# Patient Record
Sex: Female | Born: 1995 | Race: White | Hispanic: No | Marital: Single | Smoking: Current every day smoker
Health system: Southern US, Community
[De-identification: ages and names within clinical notes are randomized; demographics above are authoritative.]

## PROBLEM LIST (undated history)

## (undated) ENCOUNTER — Inpatient Hospital Stay (HOSPITAL_COMMUNITY): Payer: Self-pay

## (undated) DIAGNOSIS — F419 Anxiety disorder, unspecified: Secondary | ICD-10-CM

## (undated) DIAGNOSIS — R87629 Unspecified abnormal cytological findings in specimens from vagina: Secondary | ICD-10-CM

## (undated) DIAGNOSIS — R569 Unspecified convulsions: Secondary | ICD-10-CM

## (undated) DIAGNOSIS — I10 Essential (primary) hypertension: Secondary | ICD-10-CM

## (undated) DIAGNOSIS — A549 Gonococcal infection, unspecified: Secondary | ICD-10-CM

## (undated) DIAGNOSIS — G40909 Epilepsy, unspecified, not intractable, without status epilepticus: Secondary | ICD-10-CM

## (undated) DIAGNOSIS — F319 Bipolar disorder, unspecified: Secondary | ICD-10-CM

## (undated) DIAGNOSIS — F53 Postpartum depression: Secondary | ICD-10-CM

## (undated) DIAGNOSIS — A749 Chlamydial infection, unspecified: Secondary | ICD-10-CM

## (undated) HISTORY — DX: Essential (primary) hypertension: I10

## (undated) HISTORY — PX: NO PAST SURGERIES: SHX2092

---
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
## 2015-09-09 ENCOUNTER — Emergency Department (HOSPITAL_COMMUNITY)
Admission: EM | Admit: 2015-09-09 | Discharge: 2015-09-09 | Disposition: A | Discharge status: Home / Self Care | Payer: Self-pay | Attending: Emergency Medicine | Admitting: Emergency Medicine

## 2015-09-09 ENCOUNTER — Encounter (HOSPITAL_COMMUNITY): Payer: Self-pay | Admitting: Emergency Medicine

## 2015-09-09 ENCOUNTER — Emergency Department (HOSPITAL_COMMUNITY): Payer: Self-pay

## 2015-09-09 ENCOUNTER — Inpatient Hospital Stay (HOSPITAL_COMMUNITY)
Admission: EM | Admit: 2015-09-09 | Discharge: 2015-09-11 | DRG: 101 | Disposition: A | Discharge status: Home / Self Care | Payer: Self-pay | Attending: Internal Medicine | Admitting: Internal Medicine

## 2015-09-09 ENCOUNTER — Encounter (HOSPITAL_COMMUNITY): Payer: Self-pay | Admitting: Radiology

## 2015-09-09 DIAGNOSIS — F1290 Cannabis use, unspecified, uncomplicated: Secondary | ICD-10-CM | POA: Diagnosis present

## 2015-09-09 DIAGNOSIS — Z91048 Other nonmedicinal substance allergy status: Secondary | ICD-10-CM

## 2015-09-09 DIAGNOSIS — R451 Restlessness and agitation: Secondary | ICD-10-CM | POA: Diagnosis not present

## 2015-09-09 DIAGNOSIS — F17200 Nicotine dependence, unspecified, uncomplicated: Secondary | ICD-10-CM | POA: Diagnosis present

## 2015-09-09 DIAGNOSIS — G40909 Epilepsy, unspecified, not intractable, without status epilepticus: Secondary | ICD-10-CM

## 2015-09-09 DIAGNOSIS — Z9114 Patient's other noncompliance with medication regimen: Secondary | ICD-10-CM

## 2015-09-09 DIAGNOSIS — F319 Bipolar disorder, unspecified: Secondary | ICD-10-CM | POA: Diagnosis present

## 2015-09-09 DIAGNOSIS — F419 Anxiety disorder, unspecified: Secondary | ICD-10-CM | POA: Diagnosis present

## 2015-09-09 DIAGNOSIS — Z79899 Other long term (current) drug therapy: Secondary | ICD-10-CM | POA: Insufficient documentation

## 2015-09-09 DIAGNOSIS — G40409 Other generalized epilepsy and epileptic syndromes, not intractable, without status epilepticus: Principal | ICD-10-CM | POA: Diagnosis present

## 2015-09-09 DIAGNOSIS — R112 Nausea with vomiting, unspecified: Secondary | ICD-10-CM

## 2015-09-09 DIAGNOSIS — E876 Hypokalemia: Secondary | ICD-10-CM | POA: Diagnosis present

## 2015-09-09 DIAGNOSIS — R569 Unspecified convulsions: Secondary | ICD-10-CM

## 2015-09-09 HISTORY — DX: Unspecified convulsions: R56.9

## 2015-09-09 HISTORY — DX: Bipolar disorder, unspecified: F31.9

## 2015-09-09 LAB — COMPREHENSIVE METABOLIC PANEL
ALBUMIN: 4.5 g/dL (ref 3.5–5.0)
ALT: 13 U/L — ABNORMAL LOW (ref 14–54)
ANION GAP: 6 (ref 5–15)
AST: 20 U/L (ref 15–41)
Alkaline Phosphatase: 61 U/L (ref 38–126)
BILIRUBIN TOTAL: 1 mg/dL (ref 0.3–1.2)
BUN: 13 mg/dL (ref 6–20)
CO2: 25 mmol/L (ref 22–32)
Calcium: 9.3 mg/dL (ref 8.9–10.3)
Chloride: 106 mmol/L (ref 101–111)
Creatinine, Ser: 0.86 mg/dL (ref 0.44–1.00)
GFR calc Af Amer: >60 mL/min (ref >60)
GLUCOSE: 87 mg/dL (ref 65–99)
POTASSIUM: 3.9 mmol/L (ref 3.5–5.1)
Sodium: 137 mmol/L (ref 135–145)
TOTAL PROTEIN: 8.2 g/dL — AB (ref 6.5–8.1)

## 2015-09-09 LAB — I-STAT BETA HCG BLOOD, ED (MC, WL, AP ONLY): I-stat hCG, quantitative: <5 m[IU]/mL (ref <5)

## 2015-09-09 LAB — I-STAT CHEM 8, ED
BUN: 8 mg/dL (ref 6–20)
CALCIUM ION: 1.19 mmol/L (ref 1.13–1.30)
CREATININE: 0.8 mg/dL (ref 0.44–1.00)
Chloride: 102 mmol/L (ref 101–111)
GLUCOSE: 81 mg/dL (ref 65–99)
HCT: 40 % (ref 36.0–46.0)
HEMOGLOBIN: 13.6 g/dL (ref 12.0–15.0)
Potassium: 3.4 mmol/L — ABNORMAL LOW (ref 3.5–5.1)
Sodium: 140 mmol/L (ref 135–145)
TCO2: 24 mmol/L (ref 0–100)

## 2015-09-09 LAB — CBC WITH DIFFERENTIAL/PLATELET
BASOS PCT: 1 %
Basophils Absolute: 0.1 10*3/uL (ref 0.0–0.1)
EOS PCT: 2 %
Eosinophils Absolute: 0.2 10*3/uL (ref 0.0–0.7)
HEMATOCRIT: 39.9 % (ref 36.0–46.0)
Hemoglobin: 12.9 g/dL (ref 12.0–15.0)
Lymphocytes Relative: 32 %
Lymphs Abs: 2.5 10*3/uL (ref 0.7–4.0)
MCH: 27.7 pg (ref 26.0–34.0)
MCHC: 32.3 g/dL (ref 30.0–36.0)
MCV: 85.8 fL (ref 78.0–100.0)
MONO ABS: 1 10*3/uL (ref 0.1–1.0)
MONOS PCT: 13 %
NEUTROS ABS: 4.1 10*3/uL (ref 1.7–7.7)
Neutrophils Relative %: 52 %
Platelets: 282 10*3/uL (ref 150–400)
RBC: 4.65 MIL/uL (ref 3.87–5.11)
RDW: 14.7 % (ref 11.5–15.5)
WBC: 7.8 10*3/uL (ref 4.0–10.5)

## 2015-09-09 LAB — CBG MONITORING, ED: Glucose-Capillary: 93 mg/dL (ref 65–99)

## 2015-09-09 LAB — ETHANOL: Alcohol, Ethyl (B): <5 mg/dL (ref <5)

## 2015-09-09 LAB — RAPID URINE DRUG SCREEN, HOSP PERFORMED
AMPHETAMINES: NOT DETECTED
BARBITURATES: NOT DETECTED
BENZODIAZEPINES: NOT DETECTED
COCAINE: NOT DETECTED
Opiates: NOT DETECTED
TETRAHYDROCANNABINOL: POSITIVE — AB

## 2015-09-09 MED ORDER — LORAZEPAM 2 MG/ML IJ SOLN
1.0000 mg | Freq: Once | INTRAMUSCULAR | Status: AC
  Administered 2015-09-09: 1 mg via INTRAVENOUS
  Filled 2015-09-09: qty 1

## 2015-09-09 MED ORDER — LEVETIRACETAM 750 MG PO TABS
750.0000 mg | ORAL_TABLET | Freq: Two times a day (BID) | ORAL | 0 refills | Status: DC
Start: 2015-09-09 — End: 2015-09-11

## 2015-09-09 MED ORDER — LORAZEPAM 2 MG/ML IJ SOLN
INTRAMUSCULAR | Status: AC
  Administered 2015-09-09: 1 mg
  Filled 2015-09-09: qty 1

## 2015-09-09 MED ORDER — LACOSAMIDE 200 MG/20ML IV SOLN
200.0000 mg | Freq: Two times a day (BID) | INTRAVENOUS | Status: DC
  Administered 2015-09-09: 200 mg via INTRAVENOUS
  Filled 2015-09-09 (×2): qty 20

## 2015-09-09 MED ORDER — OXYCODONE-ACETAMINOPHEN 5-325 MG PO TABS
1.0000 | ORAL_TABLET | Freq: Once | ORAL | Status: AC
  Administered 2015-09-09: 1 via ORAL
  Filled 2015-09-09: qty 1

## 2015-09-09 MED ORDER — SODIUM CHLORIDE 0.9 % IV SOLN
1000.0000 mg | Freq: Once | INTRAVENOUS | Status: AC
  Administered 2015-09-09: 1000 mg via INTRAVENOUS
  Filled 2015-09-09: qty 10

## 2015-09-09 MED ORDER — ONDANSETRON HCL 4 MG/2ML IJ SOLN
4.0000 mg | Freq: Once | INTRAMUSCULAR | Status: AC
Start: 2015-09-10 — End: 2015-09-09
  Administered 2015-09-09: 4 mg via INTRAVENOUS
  Filled 2015-09-09: qty 2

## 2015-09-09 NOTE — ED Provider Notes (Signed)
[facility]-EMERGENCY DEPT Provider Note   CSN: [id number] Arrival date & time: 09/09/15  1521  First Provider Contact:  None       History   Chief Complaint Chief Complaint  Patient presents with  . Seizures    HPI Renee Velez is a 20 y.o. female.  20 year old female presents after having a witnessed seizure at home. Seizure lasted less than 5 minutes and was characterized as tonic-clonic activity with a slight postictal period. No tongue injury. No loss of bladder function. Patient does have a history of epilepsy and has been noncompliant with her Keppra for the past 1 month. Denies any recent use of alcohol or illicit drugs. Has a mild headache at this time but otherwise feels back to her baseline. Denies any focal neurological deficits. Patient states that stress is usually the cause of her seizures and she has been under great deal that recently   The history is provided by the patient.  Seizures      History reviewed. No pertinent past medical history.  There are no active problems to display for this patient.   History reviewed. No pertinent surgical history.  OB History    No data available       Home Medications    Prior to Admission medications   Not on File    Family History History reviewed. No pertinent family history.  Social History Social History  Substance Use Topics  . Smoking status: Current Every Day Smoker  . Smokeless tobacco: Never Used  . Alcohol use No     Allergies   Review of patient's allergies indicates no known allergies.   Review of Systems Review of Systems  Neurological: Positive for seizures.  All other systems reviewed and are negative.    Physical Exam Updated Vital Signs Ht  (1.651 m)   Wt 49.9 kg   LMP 08/04/2015   BMI 18.30 kg/m   Physical Exam  Constitutional: She is oriented to person, place, and time. She appears well-developed and well-nourished.  Non-toxic appearance. No distress.  HENT:  Head:  Normocephalic and atraumatic.  Eyes: Conjunctivae, EOM and lids are normal. Pupils are equal, round, and reactive to light.  Neck: Normal range of motion. Neck supple. No tracheal deviation present. No thyroid mass present.  Cardiovascular: Normal rate, regular rhythm and normal heart sounds.  Exam reveals no gallop.   No murmur heard. Pulmonary/Chest: Effort normal and breath sounds normal. No stridor. No respiratory distress. She has no decreased breath sounds. She has no wheezes. She has no rhonchi. She has no rales.  Abdominal: Soft. Normal appearance and bowel sounds are normal. She exhibits no distension. There is no tenderness. There is no rebound and no CVA tenderness.  Musculoskeletal: Normal range of motion. She exhibits no edema or tenderness.  Neurological: She is alert and oriented to person, place, and time. She has normal strength. No cranial nerve deficit or sensory deficit. GCS eye subscore is 4. GCS verbal subscore is 5. GCS motor subscore is 6.  Skin: Skin is warm and dry. No abrasion and no rash noted.  Psychiatric: She has a normal mood and affect. Her speech is normal and behavior is normal.  Nursing note and vitals reviewed.    ED Treatments / Results  Labs (all labs ordered are listed, but only abnormal results are displayed) Labs Reviewed  I-STAT BETA HCG BLOOD, ED ([facility], [facility], [facility] ONLY)  I-STAT CHEM 8, ED    EKG  EKG Interpretation None  Radiology No results found.  Procedures Procedures (including critical care time)  Medications Ordered in ED Medications  levETIRAcetam (KEPPRA) 1,000 mg in sodium chloride 0.9 % 100 mL IVPB (not administered)     Initial Impression / Assessment and Plan / ED Course  I have reviewed the triage vital signs and the nursing notes.  Pertinent labs & imaging results that were available during my care of the patient were reviewed by me and considered in my medical decision making (see chart for details).  Clinical  Course    Patient loaded with Keppra here and no seizure activity noted. Begin prescription for same and referrals  Final Clinical Impressions(s) / ED Diagnoses   Final diagnoses:  None    New Prescriptions New Prescriptions   No medications on file     Lorre NickAnthony Francella Barnett, MD 09/09/15 [phone]

## 2015-09-09 NOTE — ED Triage Notes (Signed)
Patient brought in by private care. Patient having seizure in car. When she was removed from care and brought to room patient was postictal.

## 2015-09-09 NOTE — ED Provider Notes (Signed)
[facility]PT Provider Note   CSN: [id number] Arrival date & time: 09/09/15  2237  First Provider Contact:  None       History   Chief Complaint Chief Complaint  Patient presents with  . Seizures    HPI Renee Velez is a 20 y.o. female.  20 year old female who was seen by me earlier today after having a witnessed seizure at home. She was loaded with Keppra here and discharged to home. According to her boyfriend, she had an additional seizure characterized as generalized tonic-clonic activity and he drove the patient here. ed staff found the patient be postictal. Patient does not remember what happened prior to arrival. Patient's boyfriend thinks that she may have drank beer but denies any illicit drug use.    Past Medical History:  Diagnosis Date  . Seizure (HCC)     There are no active problems to display for this patient.   History reviewed. No pertinent surgical history.  OB History    No data available       Home Medications    Prior to Admission medications   Medication Sig Start Date End Date Taking? Authorizing Provider  busPIRone (BUSPAR) 10 MG tablet Take 10 mg by mouth 3 (three) times daily.   Yes Historical Provider, MD  folic acid (FOLVITE) 1 MG tablet Take 1 mg by mouth 4 (four) times daily.   Yes Historical Provider, MD  levETIRAcetam (KEPPRA) 750 MG tablet Take 750 mg by mouth 2 (two) times daily.   Yes Historical Provider, MD  levETIRAcetam (KEPPRA) 750 MG tablet Take 1 tablet (750 mg total) by mouth 2 (two) times daily. 09/09/15  Yes Lorre NickAnthony Lexany Belknap, MD    Family History History reviewed. No pertinent family history.  Social History Social History  Substance Use Topics  . Smoking status: Current Every Day Smoker  . Smokeless tobacco: Never Used  . Alcohol use No     Allergies   Nickel   Review of Systems Review of Systems  Unable to perform ROS: Acuity of condition     Physical Exam Updated Vital Signs BP 135/82 (BP Location:  Right Arm)   Pulse 89   Temp 98.1 F (36.7 C) (Oral)   Resp 18   LMP 08/04/2015   SpO2 100%   Physical Exam  Constitutional: She appears well-developed and well-nourished. She appears lethargic.  Non-toxic appearance. No distress.  HENT:  Head: Normocephalic and atraumatic.  Eyes: Conjunctivae, EOM and lids are normal. Pupils are equal, round, and reactive to light.  Neck: Normal range of motion. Neck supple. No tracheal deviation present. No thyroid mass present.  Cardiovascular: Normal rate, regular rhythm and normal heart sounds.  Exam reveals no gallop.   No murmur heard. Pulmonary/Chest: Effort normal and breath sounds normal. No stridor. No respiratory distress. She has no decreased breath sounds. She has no wheezes. She has no rhonchi. She has no rales.  Abdominal: Soft. Normal appearance and bowel sounds are normal. She exhibits no distension. There is no tenderness. There is no rebound and no CVA tenderness.  Musculoskeletal: Normal range of motion. She exhibits no edema or tenderness.  Neurological: She has normal strength. She appears lethargic. She is disoriented. No cranial nerve deficit or sensory deficit. GCS eye subscore is 4. GCS verbal subscore is 5. GCS motor subscore is 6.  Skin: Skin is warm and dry. No abrasion and no rash noted.  Psychiatric: She has a normal mood and affect. Her speech is normal and behavior is  normal.  Nursing note and vitals reviewed.    ED Treatments / Results  Labs (all labs ordered are listed, but only abnormal results are displayed) Labs Reviewed  URINE RAPID DRUG SCREEN, HOSP PERFORMED - Abnormal; Notable for the following:       Result Value   Tetrahydrocannabinol POSITIVE (*)    All other components within normal limits  I-STAT CHEM 8, ED - Abnormal; Notable for the following:    Potassium 3.4 (*)    All other components within normal limits  ETHANOL  CBG MONITORING, ED    EKG  EKG Interpretation None        Radiology No results found.  Procedures Procedures (including critical care time)  Medications Ordered in ED Medications  LORazepam (ATIVAN) injection 1 mg (1 mg Intravenous Given 09/09/15 2250)  LORazepam (ATIVAN) injection 1 mg (1 mg Intravenous Given 09/09/15 2307)     Initial Impression / Assessment and Plan / ED Course  I have reviewed the triage vital signs and the nursing notes.  Pertinent labs & imaging results that were available during my care of the patient were reviewed by me and considered in my medical decision making (see chart for details).  Clinical Course    Patient given Ativan 1 mg IV push and then had an additional seizure while having her neuroimaging. Was given additional 1 mg of Ativan. Patient stressed screen positive for marijuana. Electrolytes without significant abnormality. Counseled to neurology was obtained by Dr. Roseanne Reno. Recommended that patient be started on Vimpat and transfer to stepdown at [facility]. [facility] consult triad hospitalist   CRITICAL CARE Performed by: Toy Baker Total critical care time: 50 minutes Critical care time was exclusive of separately billable procedures and treating other patients. Critical care was necessary to treat or prevent imminent or life-threatening deterioration. Critical care was time spent personally by me on the following activities: development of treatment plan with patient and/or surrogate as well as nursing, discussions with consultants, evaluation of patient's response to treatment, examination of patient, obtaining history from patient or surrogate, ordering and performing treatments and interventions, ordering and review of laboratory studies, ordering and review of radiographic studies, pulse oximetry and re-evaluation of patient's condition.   Final Clinical Impressions(s) / ED Diagnoses   Final diagnoses:  None    New Prescriptions New Prescriptions   No medications on file      Lorre Nick, MD 09/09/15 2323

## 2015-09-09 NOTE — ED Triage Notes (Signed)
Pt had witnessed seizure earlier today. Pt out of seizure medication. Complains of HA/light sensitivity. Alert and oriented.

## 2015-09-09 NOTE — ED Notes (Signed)
Pt returned from CT, states pt had 2 seizures during CT. Freida BusmanAllen made aware.

## 2015-09-10 ENCOUNTER — Encounter (HOSPITAL_COMMUNITY): Payer: Self-pay | Admitting: Internal Medicine

## 2015-09-10 ENCOUNTER — Observation Stay (HOSPITAL_COMMUNITY): Payer: Self-pay

## 2015-09-10 DIAGNOSIS — F17200 Nicotine dependence, unspecified, uncomplicated: Secondary | ICD-10-CM | POA: Diagnosis present

## 2015-09-10 DIAGNOSIS — R569 Unspecified convulsions: Secondary | ICD-10-CM

## 2015-09-10 DIAGNOSIS — E876 Hypokalemia: Secondary | ICD-10-CM | POA: Diagnosis present

## 2015-09-10 DIAGNOSIS — F1290 Cannabis use, unspecified, uncomplicated: Secondary | ICD-10-CM | POA: Diagnosis present

## 2015-09-10 DIAGNOSIS — F319 Bipolar disorder, unspecified: Secondary | ICD-10-CM | POA: Diagnosis present

## 2015-09-10 DIAGNOSIS — G40909 Epilepsy, unspecified, not intractable, without status epilepticus: Secondary | ICD-10-CM

## 2015-09-10 LAB — URINALYSIS, ROUTINE W REFLEX MICROSCOPIC
BILIRUBIN URINE: NEGATIVE
GLUCOSE, UA: NEGATIVE mg/dL
Hgb urine dipstick: NEGATIVE
KETONES UR: 40 mg/dL — AB
Leukocytes, UA: NEGATIVE
Nitrite: NEGATIVE
PH: 6 (ref 5.0–8.0)
Protein, ur: NEGATIVE mg/dL
SPECIFIC GRAVITY, URINE: 1.02 (ref 1.005–1.030)

## 2015-09-10 LAB — COMPREHENSIVE METABOLIC PANEL
ALBUMIN: 3.7 g/dL (ref 3.5–5.0)
ALK PHOS: 51 U/L (ref 38–126)
ALT: 12 U/L — ABNORMAL LOW (ref 14–54)
ANION GAP: 7 (ref 5–15)
AST: 16 U/L (ref 15–41)
BILIRUBIN TOTAL: 0.9 mg/dL (ref 0.3–1.2)
BUN: 6 mg/dL (ref 6–20)
CALCIUM: 9 mg/dL (ref 8.9–10.3)
CO2: 22 mmol/L (ref 22–32)
Chloride: 108 mmol/L (ref 101–111)
Creatinine, Ser: 0.74 mg/dL (ref 0.44–1.00)
GFR calc Af Amer: >60 mL/min (ref >60)
GLUCOSE: 85 mg/dL (ref 65–99)
POTASSIUM: 3.6 mmol/L (ref 3.5–5.1)
Sodium: 137 mmol/L (ref 135–145)
TOTAL PROTEIN: 6.6 g/dL (ref 6.5–8.1)

## 2015-09-10 LAB — CBG MONITORING, ED: GLUCOSE-CAPILLARY: 86 mg/dL (ref 65–99)

## 2015-09-10 LAB — CBC
HCT: 37.8 % (ref 36.0–46.0)
Hemoglobin: 12 g/dL (ref 12.0–15.0)
MCH: 27.3 pg (ref 26.0–34.0)
MCHC: 31.7 g/dL (ref 30.0–36.0)
MCV: 85.9 fL (ref 78.0–100.0)
PLATELETS: 267 10*3/uL (ref 150–400)
RBC: 4.4 MIL/uL (ref 3.87–5.11)
RDW: 14.2 % (ref 11.5–15.5)
WBC: 9.6 10*3/uL (ref 4.0–10.5)

## 2015-09-10 LAB — CREATININE, SERUM: Creatinine, Ser: 0.79 mg/dL (ref 0.44–1.00)

## 2015-09-10 LAB — LIPASE, BLOOD: LIPASE: 35 U/L (ref 11–51)

## 2015-09-10 LAB — MRSA PCR SCREENING: MRSA by PCR: NEGATIVE

## 2015-09-10 MED ORDER — NICOTINE 21 MG/24HR TD PT24
21.0000 mg | MEDICATED_PATCH | Freq: Every day | TRANSDERMAL | Status: DC | PRN
  Filled 2015-09-10: qty 1

## 2015-09-10 MED ORDER — SODIUM CHLORIDE 0.9 % IV SOLN
1000.0000 mg | Freq: Two times a day (BID) | INTRAVENOUS | Status: DC
  Administered 2015-09-10 – 2015-09-11 (×2): 1000 mg via INTRAVENOUS
  Filled 2015-09-10 (×3): qty 10

## 2015-09-10 MED ORDER — SODIUM CHLORIDE 0.9% FLUSH
3.0000 mL | Freq: Two times a day (BID) | INTRAVENOUS | Status: DC
  Administered 2015-09-10 (×2): 3 mL via INTRAVENOUS

## 2015-09-10 MED ORDER — IOPAMIDOL (ISOVUE-300) INJECTION 61%
INTRAVENOUS | Status: AC
  Administered 2015-09-10: 100 mL
  Filled 2015-09-10: qty 100

## 2015-09-10 MED ORDER — LORAZEPAM 2 MG/ML IJ SOLN
1.0000 mg | INTRAMUSCULAR | Status: DC | PRN
  Administered 2015-09-10 – 2015-09-11 (×4): 1 mg via INTRAVENOUS
  Filled 2015-09-10 (×4): qty 1

## 2015-09-10 MED ORDER — OXYCODONE HCL 5 MG PO TABS
5.0000 mg | ORAL_TABLET | Freq: Four times a day (QID) | ORAL | Status: DC | PRN
  Administered 2015-09-11: 5 mg via ORAL
  Filled 2015-09-10 (×2): qty 1

## 2015-09-10 MED ORDER — ONDANSETRON HCL 4 MG/2ML IJ SOLN
4.0000 mg | Freq: Four times a day (QID) | INTRAMUSCULAR | Status: DC | PRN
Start: 2015-09-10 — End: 2015-09-11
  Administered 2015-09-10 (×2): 4 mg via INTRAVENOUS
  Filled 2015-09-10 (×3): qty 2

## 2015-09-10 MED ORDER — KETOROLAC TROMETHAMINE 60 MG/2ML IM SOLN
60.0000 mg | Freq: Once | INTRAMUSCULAR | Status: DC
  Filled 2015-09-10: qty 2

## 2015-09-10 MED ORDER — SODIUM CHLORIDE 0.9 % IV SOLN
100.0000 mg | Freq: Two times a day (BID) | INTRAVENOUS | Status: DC
  Administered 2015-09-10 (×2): 100 mg via INTRAVENOUS
  Filled 2015-09-10 (×5): qty 10

## 2015-09-10 MED ORDER — KETOROLAC TROMETHAMINE 30 MG/ML IJ SOLN
30.0000 mg | Freq: Four times a day (QID) | INTRAMUSCULAR | Status: DC | PRN
  Administered 2015-09-11: 30 mg via INTRAVENOUS
  Filled 2015-09-10: qty 1

## 2015-09-10 MED ORDER — POTASSIUM CHLORIDE IN NACL 40-0.9 MEQ/L-% IV SOLN
INTRAVENOUS | Status: AC
  Administered 2015-09-10: 100 mL/h via INTRAVENOUS
  Filled 2015-09-10 (×2): qty 1000

## 2015-09-10 MED ORDER — SODIUM CHLORIDE 0.9 % IV SOLN
500.0000 mg | Freq: Once | INTRAVENOUS | Status: AC
  Administered 2015-09-10: 500 mg via INTRAVENOUS
  Filled 2015-09-10: qty 5

## 2015-09-10 MED ORDER — SODIUM CHLORIDE 0.9 % IV SOLN
750.0000 mg | Freq: Two times a day (BID) | INTRAVENOUS | Status: DC
  Administered 2015-09-10 (×2): 750 mg via INTRAVENOUS
  Filled 2015-09-10 (×3): qty 7.5

## 2015-09-10 MED ORDER — ENOXAPARIN SODIUM 40 MG/0.4ML ~~LOC~~ SOLN
40.0000 mg | Freq: Every day | SUBCUTANEOUS | Status: DC
  Administered 2015-09-10 – 2015-09-11 (×2): 40 mg via SUBCUTANEOUS
  Filled 2015-09-10 (×2): qty 0.4

## 2015-09-10 MED ORDER — NICOTINE 21 MG/24HR TD PT24
21.0000 mg | MEDICATED_PATCH | Freq: Every day | TRANSDERMAL | Status: DC
  Administered 2015-09-10 – 2015-09-11 (×2): 21 mg via TRANSDERMAL
  Filled 2015-09-10: qty 1

## 2015-09-10 MED ORDER — ONDANSETRON HCL 4 MG PO TABS: 4.0000 mg | ORAL_TABLET | Freq: Four times a day (QID) | ORAL | Status: DC | PRN

## 2015-09-10 NOTE — Progress Notes (Addendum)
Patient refused EEG . Just before EEG hookup was attempted pt began moving her head back and forth, eyes closed, not responding or following commands. RN paged, this activity lasted approximately 1 minute. Pt appeared drowsy but would readily follow commands and began talking. Almost immediatly after RN mentioned giving Keppra the patient  became very agitated and demanded to be discharged. RN and Water quality scientistunit director were in the room during this time. Just prior to this event pt stated that she "really needs a blunt"

## 2015-09-10 NOTE — Progress Notes (Signed)
Subjective:  Reviewed and was observed to have some brief seizure activity by the nursing team. Upon direct examination at this time rate is noted to have her boyfriend in the bed with her. After he was asked to exit the bed she was examined. She was able to follow commands in all 4 extremities. However, she seemed agitated and would not answer any questions. Attempts to examine her were met with significant resistance. This appeared somewhat atypical for a post ictal state.  Exam: Vitals:   09/10/15 0817 09/10/15 1146  BP: 125/62 122/67  Pulse: 61 (!) 54  Resp: 18 14  Temp: 98.2 F (36.8 C) 97.6 F (36.4 C)    HEENT-  Normocephalic, no lesions, without obvious abnormality.  Normal external eye and conjunctiva.  Normal TM's bilaterally.  Normal auditory canals and external ears. Normal external nose, mucus membranes and septum.  Normal pharynx. Cardiovascular- regular rate and rhythm, S1, S2 normal, no murmur, click, rub or gallop, pulses palpable throughout   Lungs- chest clear, no wheezing, rales, normal symmetric air entry, Heart exam - S1, S2 normal, no murmur, no gallop, rate regular Abdomen- soft, non-tender; bowel sounds normal; no masses,  no organomegaly Extremities- less then 2 second capillary refill Lymph-no adenopathy palpable Musculoskeletal-no joint tenderness, deformity or swelling Skin-warm and dry, no hyperpigmentation, vitiligo, or suspicious lesions    Gen: In bed, NAD MS: Jonee is able to follow commands. However, when asked questions she appears quite agitated and shakes her head back and forth and does not respond. Attempts to perform evaluation of the pupils is met with significant resistance. CN: There is no obvious facial asymmetry. Motor: Spontaneous movement of all 4 extremities Sensory: Unable to fully assess. DTR: Unable to assess due to resistance to exam.  Pertinent Labs/Diagnostics: Reviewed    Impression:   Najae was observed to have 4  episodes of shaking possibly representing seizure activity. We will provide a bolus of Keppra 500 mg. We will also increase the Keppra to 1000 mg twice a day. We will request continuous video EEG monitoring to further assess these spells. Her present behavior seems somewhat atypical for a post ictal state.  The nursing team mentioned that Valhalla had stated she did not want to be on Keppra. At the present time Verginia is not answering any questions about this. It is unknown what the issue with the Keppra is. It would seem reasonable to attempt to obtain seizure control with the Nelson given that that is already being used at this time. It will always be possible to switch to a new medication once the more urgent issue of seizure control has been obtained. She continues on Vimpat at the same time. However, Vimpat is an adjunctive medication and is not indicated for primary therapy.   Recommendations:  1. Bolus Keppra 500 mg IV. Increase Keppra 1000 mg twice a day.  2. Continuous video EEG monitoring.   James A. Tasia Catchings, M.D. Neurohospitalist Phone: [phone]   09/10/2015, 2:31 PM

## 2015-09-10 NOTE — Progress Notes (Addendum)
Patient began having generalized tonic clonic seizures, total of 4 seen all lasting less than a min each. PRN ativan given. Dr. Susie CassetteAbrol and Dr. Hilda BladesArmstrong notified. Will continue to monitor.

## 2015-09-10 NOTE — H&P (Signed)
History and Physical    Renee Velez RUE:[id number]:[id number] DOB: Nov 15, 1995 DOA: 09/09/2015  PCP: No PCP Per Patient   Patient coming from: Home.  Chief Complaint: Seizures.  HPI: Renee ObeyRaven Velez is a 20 y.o. female with medical history significant of bipolar disorder 1, seizure disorder, tobacco use disorder who was brought to the emergency department via private vehicle after the patient had a seizure at home.  Per patient's boyfriend, TJ, she seemed to have had an absence seizures earlier in the evening. Several minutes later, her boyfriend states that she started having tonic-clonic movements, but denies urinary or fecal incontinence, tongue biting or any other trauma. He states that he subsequently carried her to his vehicle and brought her to the emergency department. She has been out of her medications, including her Keppra, for the past month and a half.  ED Course: In the ER, the patient received lorazepam 1 mg 2 doses, ondansetron 4 mg IVP 1 and Vimpat 200 mg IVPB. She is currently sedated and unable to provide further information or history.  Review of Systems: As per HPI otherwise 10 point review of systems negative.   Past Medical History:  Diagnosis Date  . Bipolar 1 disorder (HCC)   . Seizure [facility](HCC)     History reviewed. No pertinent surgical history per patient's boyfriend. The patient is currently sedated and unable to provide further history.   reports that she has been smoking.  She has never used smokeless tobacco. She reports that she drinks alcohol. She reports that she does not use drugs.  Allergies  Allergen Reactions  . Nickel Rash    History unable to be reviewed due to patient's sedation.  No pertinent family history per patient's point.   Prior to Admission medications   Medication Sig Start Date End Date Taking? Authorizing Provider  busPIRone (BUSPAR) 10 MG tablet Take 10 mg by mouth 3 (three) times daily.   Yes Historical Provider, MD  folic acid  (FOLVITE) 1 MG tablet Take 1 mg by mouth 4 (four) times daily.   Yes Historical Provider, MD  levETIRAcetam (KEPPRA) 750 MG tablet Take 750 mg by mouth 2 (two) times daily.   Yes Historical Provider, MD  levETIRAcetam (KEPPRA) 750 MG tablet Take 1 tablet (750 mg total) by mouth 2 (two) times daily. 09/09/15  Yes Lorre NickAnthony Allen, MD    Physical Exam: Vitals:   09/09/15 2245 09/09/15 2246 09/09/15 2346  BP: 135/82  118/81  Pulse: 89  80  Resp: 18  15  Temp: 98.1 F (36.7 C)    TempSrc: Oral    SpO2: 100% 100% 98%      Constitutional: NAD, calm, comfortable, Sedated. Vitals:   09/09/15 2245 09/09/15 2246 09/09/15 2346  BP: 135/82  118/81  Pulse: 89  80  Resp: 18  15  Temp: 98.1 F (36.7 C)    TempSrc: Oral    SpO2: 100% 100% 98%   Eyes: PERRL, lids and conjunctivae normal ENMT: Mucous membranes are moist. Posterior pharynx clear of any exudate or lesions. Normal dentition.  Neck: normal, supple, no masses, no thyromegaly Respiratory: clear to auscultation bilaterally, no wheezing, no crackles. Normal respiratory effort. No accessory muscle use.  Cardiovascular: Regular rate and rhythm, no murmurs / rubs / gallops. No extremity edema. 2+ pedal pulses. No carotid bruits.  Abdomen: Bowel sounds positive. no tenderness, no masses palpated.  Musculoskeletal: no clubbing / cyanosis. No joint deformity upper and lower extremities. Good ROM, no contractures. Relaxed muscle tone due  to sedation.  Skin: Small ecchymosis area in LLE (left leg) Neurologic: Sedated and unable to fully evaluate.  Psychiatric: Sedated.    Labs on Admission: I have personally reviewed following labs and imaging studies  CBC:  Recent Labs Lab 09/09/15 1545 09/09/15 2252  WBC 7.8  --   NEUTROABS 4.1  --   HGB 12.9 13.6  HCT 39.9 40.0  MCV 85.8  --   PLT 282  --    Basic Metabolic Panel:  Recent Labs Lab 09/09/15 1545 09/09/15 2252  NA 137 140  K 3.9 3.4*  CL 106 102  CO2 25  --   GLUCOSE 87  81  BUN 13 8  CREATININE 0.86 0.80  CALCIUM 9.3  --    GFR: Estimated Creatinine Clearance: 88.4 mL/min (by C-G formula based on SCr of 0.8 mg/dL). Liver Function Tests:  Recent Labs Lab 09/09/15 1545  AST 20  ALT 13*  ALKPHOS 61  BILITOT 1.0  PROT 8.2*  ALBUMIN 4.5   CBG:  Recent Labs Lab 09/09/15 2242  GLUCAP 93     Radiological Exams on Admission: Ct Head Wo Contrast  Result Date: 09/09/2015 CLINICAL DATA:  Witnessed seizure today. History of seizures, out of seizure medication. EXAM: CT HEAD WITHOUT CONTRAST TECHNIQUE: Contiguous axial images were obtained from the base of the skull through the vertex without intravenous contrast. COMPARISON:  None. FINDINGS: INTRACRANIAL CONTENTS: The ventricles and sulci are normal. No intraparenchymal hemorrhage, mass effect nor midline shift. No acute large vascular territory infarcts. No abnormal extra-axial fluid collections. Basal cisterns are patent. ORBITS: The included ocular globes and orbital contents are normal. SINUSES: The mastoid aircells and included paranasal sinuses are well-aerated. SKULL/SOFT TISSUES: No skull fracture. No significant soft tissue swelling. IMPRESSION: Normal CT HEAD. Electronically Signed   By: Awilda Metro M.D.   On: 09/09/2015 23:18    EKG: Independently reviewed. Vent. rate 90 BPM PR interval * ms QRS duration 94 ms QT/QTc 368/451 ms P-R-T axes 61 103 16 Sinus rhythm Borderline right axis deviation  Assessment/Plan Principal Problem:   Seizure disorder (HCC) Admit to [facility] stepdown/observation. Continue supplemental oxygen. Continue Vimpat 200 mg IVPB every 12 hours. Continue lorazepam as needed for seizures. Continue close vital signs and cardiac monitoring. Neurology to evaluate once the patient arrives to the stepdown unit. EEG in a.m. per neuro orders.  Active Problems:   Hypokalemia Continue supplementation through the IV fluids. Follow-up potassium level.     Episodic cannabis use Unable to evaluate use pattern from the patient at this time.    Bipolar 1 disorder (HCC) The patient's boyfriend will bring the patient's medications, but according to him, she has not been using medication in the past month and a half or so.    Tobacco use disorder Nicotine replacement therapy as needed for nicotine withdrawal symptoms.     DVT prophylaxis: Lovenox subcutaneous. Code Status: Full code. Family Communication:  Disposition Plan: Admit to [facility] stepdown for neurology evaluation and further treatment. Consults called: Neurology (Dr. Noel Christmas). Admission status: Observation/stepdown.   Bobette Mo MD [facility] Pager [phone].  If 7PM-7AM, please contact night-coverage [email or web address] Password [email or web address]  09/10/2015, 12:18 AM

## 2015-09-10 NOTE — Progress Notes (Signed)
Patient seen and examined agitated , belligerent , used " fucking and this and fucking" that multiple times, hurting all over, nauseous , demanding pain medicine, cursing out for continuing her on keppra, RN Cybill and boyfriend in the room, Ordered albs and CT abomen /pelvis to further evaluate abdominal pain Patient threatening to leave AMA ,

## 2015-09-10 NOTE — Care Management Note (Signed)
Case Management Note  Patient Details  Name: Suzanna ObeyRaven Haq MRN: [id number] Date of Birth: [birth year]  Subjective/Objective:    [place] been here for about a month, lives with boyfriend, presents with seizure, she ran out of seizure meds , has not been taking for months, n/v, noncompliance, weak, very unsteady.  She will need medication ast at dc, [facility]  NCM will cont to follow for dc needs.                 Action/Plan:   Expected Discharge Date:                  Expected Discharge Plan:  Home/Self Care  In-House Referral:     Discharge planning Services  CM Consult  Post Acute Care Choice:    Choice offered to:     DME Arranged:    DME Agency:     HH Arranged:    HH Agency:     Status of Service:  In process, will continue to follow  If discussed at Long Length of Stay Meetings, dates discussed:    Additional Comments:  Leone Havenaylor, Sewell Pitner Clinton, RN 09/10/2015, 5:04 PM

## 2015-09-10 NOTE — Progress Notes (Signed)
Patient was ordered by neuro to have an EEG obtained. While EEG tech was setting up equipment, patient began to have another episode of seizure activity. RN entered room almost immediately after it started, lasted approx 1 minute. Patient was very drowsy, but able to follow commands almost immediately after. RN informed patient she was going to administer a keppra bolus and patient immediately became irate. She began to curse at RN and state she "wasn't going to take the keppra because it doesn't work" and she was "going to be discharged right now because we weren't helping anything." When I attempted to explain to her the recommendations of the neurologist she told me to "get the f**k out of her room." Second RN and Water quality scientistunit director in patient's room attempting to deescalate the situation. Dr. Susie CassetteAbrol notified and stated patient could leave AMA if she chose, but she would not be discharged.   Leanna BattlesEckelmann, Peggye Poon Eileen, RN.

## 2015-09-10 NOTE — Progress Notes (Signed)
This is a late entry.  Patient had requested pain meds for generalized pain. Dr. Susie CassetteAbrol paged, arrived at bedside, and new orders received. RN entered room to administer pain meds at approx 1300 and patient began to have a seizure. Patient's eyes rolled back in head, she arched her back, and her left hand extended behind her back. Patient was already laying on right side and did not have to be repositioned to prevent aspiration. Entire episode lasted less than 1 minute. Boyfriend at bedside. Patient began crying and dry heaving after becoming more alert. Second RN notified and entered the room to administer IV ativan. Patient was awake, but very drowsy, and insisted on walking to the bathroom with her boyfriend's assistance. Both RN's instructed patient this was NOT safe and she was extremely high risk to have another seizure and/or fall. Patient stated she would leave AMA if we wouldn't let her walk to the bathroom and then got OOB without assistance. She was assisted to bathroom with 2 RN's and her boyfriend. While sitting on the toilet to void, patient began to have another seizure. Her eyes rolled back in her head and her body went limp. This lasted for approx 1-2 minutes before patient became alert again. Patient was held in place by 2 RN's and patient's boyfriend during this episode. She was assisted back to bed without incident after becoming alert again. New PIV was placed and patient received IV ativan as scheduled.   Leanna BattlesEckelmann, Aydrian Halpin Eileen, RN.

## 2015-09-10 NOTE — Consult Note (Addendum)
Admission H&P    Chief Complaint: Recurrent generalized seizures.  HPI: Renee Velez is an 20 y.o. female with a history of bipolar affective disorder and seizure disorder who ran out of her medications about 6 weeks ago and presented to the ED at [facility] with seizure activity. She was described as having a staring spell and subsequent generalized tonic-clonic seizure. She was given 1000 mg of Keppra IV and had returned to baseline mental status prior to being released from the ED. She had a recurrent seizure at home and also had a seizure after arriving in the emergency room again. She was given Ativan and subsequently given Vimpat 200 mg loading dose. No further seizure activity was reported. CT scan of her head was normal. Laboratory studies were unremarkable except for minimal hypokalemia and positive urine drug screen for THC.  Past Medical History:  Diagnosis Date  . Bipolar 1 disorder ([place])   . Seizure [facility])     History reviewed. No pertinent surgical history.  History reviewed. No pertinent family history. Social History:  reports that she has been smoking.  She has never used smokeless tobacco. She reports that she drinks alcohol. She reports that she does not use drugs.  Allergies:  Allergies  Allergen Reactions  . Nickel Rash    Medications Prior to Admission  Medication Sig Dispense Refill  . busPIRone (BUSPAR) 10 MG tablet Take 10 mg by mouth 3 (three) times daily.    . folic acid (FOLVITE) 1 MG tablet Take 1 mg by mouth 4 (four) times daily.    Marland Kitchen levETIRAcetam (KEPPRA) 750 MG tablet Take 750 mg by mouth 2 (two) times daily.    Marland Kitchen levETIRAcetam (KEPPRA) 750 MG tablet Take 1 tablet (750 mg total) by mouth 2 (two) times daily. 60 tablet 0    ROS: Unavailable due to patient's mental status.  Physical Examination: Blood pressure 92/74, pulse 73, temperature 97.5 F (36.4 C), temperature source Axillary, resp. rate 20, height '5\' 4"'  (1.626 m), weight 55 kg (121  lb 4.1 oz), last menstrual period 08/04/2015, SpO2 100 %.  HEENT-  Normocephalic, no lesions, without obvious abnormality.  Normal external eye and conjunctiva.  Normal TM's bilaterally.  Normal auditory canals and external ears. Normal external nose, mucus membranes and septum.  Normal pharynx. Neck supple with no masses, nodes, nodules or enlargement. Cardiovascular - regular rate and rhythm, S1, S2 normal, no murmur, click, rub or gallop Lungs - chest clear, no wheezing, rales, normal symmetric air entry, Heart exam - S1, S2 normal, no murmur, no gallop, rate regular Abdomen - soft, non-tender; bowel sounds normal; no masses,  no organomegaly Extremities - no joint deformities, effusion, or inflammation and no edema  Neurologic Examination: Mental Status: Somnolent and moderately difficult to arouse, oriented to current age but not to place, no acute distress.  Speech slurred without evidence of aphasia. Able to follow commands without difficulty. Cranial Nerves: II-Visual fields were normal. III/IV/VI-Pupils were equal and reacted. Extraocular movements were full and conjugate.    VII- no facial weakness. VIII-normal. X-moderate dysarthria, commensurate with level of alertness XII-midline tongue extension with normal strength. Motor: 5/5 bilaterally with normal tone and bulk Sensory: Normal throughout. Deep Tendon Reflexes: 2+ and symmetric. Plantars: Flexor bilaterally  Results for orders placed or performed during the hospital encounter of 09/09/15 (from the past 48 hour(s))  CBC with Differential/Platelet     Status: None   Collection Time: 09/09/15  3:45 PM  Result Value Ref Range  WBC 7.8 4.0 - 10.5 K/uL   RBC 4.65 3.87 - 5.11 MIL/uL   Hemoglobin 12.9 12.0 - 15.0 g/dL   HCT 39.9 36.0 - 46.0 %   MCV 85.8 78.0 - 100.0 fL   MCH 27.7 26.0 - 34.0 pg   MCHC 32.3 30.0 - 36.0 g/dL   RDW 14.7 11.5 - 15.5 %   Platelets 282 150 - 400 K/uL   Neutrophils Relative % 52 %   Neutro  Abs 4.1 1.7 - 7.7 K/uL   Lymphocytes Relative 32 %   Lymphs Abs 2.5 0.7 - 4.0 K/uL   Monocytes Relative 13 %   Monocytes Absolute 1.0 0.1 - 1.0 K/uL   Eosinophils Relative 2 %   Eosinophils Absolute 0.2 0.0 - 0.7 K/uL   Basophils Relative 1 %   Basophils Absolute 0.1 0.0 - 0.1 K/uL  Comprehensive metabolic panel     Status: Abnormal   Collection Time: 09/09/15  3:45 PM  Result Value Ref Range   Sodium 137 135 - 145 mmol/L   Potassium 3.9 3.5 - 5.1 mmol/L   Chloride 106 101 - 111 mmol/L   CO2 25 22 - 32 mmol/L   Glucose, Bld 87 65 - 99 mg/dL   BUN 13 6 - 20 mg/dL   Creatinine, Ser 0.86 0.44 - 1.00 mg/dL   Calcium 9.3 8.9 - 10.3 mg/dL   Total Protein 8.2 (H) 6.5 - 8.1 g/dL   Albumin 4.5 3.5 - 5.0 g/dL   AST 20 15 - 41 U/L   ALT 13 (L) 14 - 54 U/L   Alkaline Phosphatase 61 38 - 126 U/L   Total Bilirubin 1.0 0.3 - 1.2 mg/dL   GFR calc non Af Amer >60 >60 mL/min   GFR calc Af Amer >60 >60 mL/min    Comment: (NOTE) The eGFR has been calculated using the CKD EPI equation. This calculation has not been validated in all clinical situations. eGFR's persistently <60 mL/min signify possible Chronic Kidney Disease.    Anion gap 6 5 - 15  Ethanol     Status: None   Collection Time: 09/09/15 10:41 PM  Result Value Ref Range   Alcohol, Ethyl (B) <5 <5 mg/dL    Comment:        LOWEST DETECTABLE LIMIT FOR SERUM ALCOHOL IS 5 mg/dL FOR MEDICAL PURPOSES ONLY   CBG monitoring, ED     Status: None   Collection Time: 09/09/15 10:42 PM  Result Value Ref Range   Glucose-Capillary 93 65 - 99 mg/dL  Rapid urine drug screen (hospital performed)     Status: Abnormal   Collection Time: 09/09/15 10:49 PM  Result Value Ref Range   Opiates NONE DETECTED NONE DETECTED   Cocaine NONE DETECTED NONE DETECTED   Benzodiazepines NONE DETECTED NONE DETECTED   Amphetamines NONE DETECTED NONE DETECTED   Tetrahydrocannabinol POSITIVE (A) NONE DETECTED   Barbiturates NONE DETECTED NONE DETECTED     Comment:        DRUG SCREEN FOR MEDICAL PURPOSES ONLY.  IF CONFIRMATION IS NEEDED FOR ANY PURPOSE, NOTIFY LAB WITHIN 5 DAYS.        LOWEST DETECTABLE LIMITS FOR URINE DRUG SCREEN Drug Class       Cutoff (ng/mL) Amphetamine      1000 Barbiturate      200 Benzodiazepine   222 Tricyclics       979 Opiates          300 Cocaine  300 THC              50   I-Stat Chem 8, ED     Status: Abnormal   Collection Time: 09/09/15 10:52 PM  Result Value Ref Range   Sodium 140 135 - 145 mmol/L   Potassium 3.4 (L) 3.5 - 5.1 mmol/L   Chloride 102 101 - 111 mmol/L   BUN 8 6 - 20 mg/dL   Creatinine, Ser 0.80 0.44 - 1.00 mg/dL   Glucose, Bld 81 65 - 99 mg/dL   Calcium, Ion 1.19 1.13 - 1.30 mmol/L   TCO2 24 0 - 100 mmol/L   Hemoglobin 13.6 12.0 - 15.0 g/dL   HCT 40.0 36.0 - 46.0 %  I-Stat beta hCG blood, ED ([facility], [facility], [facility] only)     Status: None   Collection Time: 09/09/15 11:21 PM  Result Value Ref Range   I-stat hCG, quantitative <5.0 <5 mIU/mL   Comment 3            Comment:   GEST. AGE      CONC.  (mIU/mL)   <=1 WEEK        5 - 50     2 WEEKS       50 - 500     3 WEEKS       100 - 10,000     4 WEEKS     1,000 - 30,000        FEMALE AND NON-PREGNANT FEMALE:     LESS THAN 5 mIU/mL   CBG monitoring, ED     Status: None   Collection Time: 09/10/15  2:11 AM  Result Value Ref Range   Glucose-Capillary 86 65 - 99 mg/dL   Ct Head Wo Contrast  Result Date: 09/09/2015 CLINICAL DATA:  Witnessed seizure today. History of seizures, out of seizure medication. EXAM: CT HEAD WITHOUT CONTRAST TECHNIQUE: Contiguous axial images were obtained from the base of the skull through the vertex without intravenous contrast. COMPARISON:  None. FINDINGS: INTRACRANIAL CONTENTS: The ventricles and sulci are normal. No intraparenchymal hemorrhage, mass effect nor midline shift. No acute large vascular territory infarcts. No abnormal extra-axial fluid collections. Basal cisterns are patent. ORBITS: The included  ocular globes and orbital contents are normal. SINUSES: The mastoid aircells and included paranasal sinuses are well-aerated. SKULL/SOFT TISSUES: No skull fracture. No significant soft tissue swelling. IMPRESSION: Normal CT HEAD. Electronically Signed   By: Elon Alas M.D.   On: 09/09/2015 23:18    Assessment/Plan 20 year old lady with known seizure disorder and poor compliance with treatment presenting with recurrent seizure activity. No focal deficits were noted and CT scan of her head was unremarkable.  Recommendations: 1. Continue Keppra at 750 mg twice a day 2. Continue Vimpat at 100 mg twice a day, for now 3. No indication for further neurodiagnostic studies including no need for MRI nor EEG  We will continue to follow this patient with you.  C.R. Nicole Kindred, MD [facility] [phone]  09/10/2015, 3:51 AM

## 2015-09-10 NOTE — ED Notes (Signed)
Attempted to call report, RN will return call 

## 2015-09-10 NOTE — Progress Notes (Signed)
Pt had seizure, lasted , she rolled eyes back and started smacking lips.

## 2015-09-11 ENCOUNTER — Inpatient Hospital Stay (HOSPITAL_COMMUNITY): Payer: Self-pay

## 2015-09-11 DIAGNOSIS — G40909 Epilepsy, unspecified, not intractable, without status epilepticus: Secondary | ICD-10-CM

## 2015-09-11 DIAGNOSIS — F1210 Cannabis abuse, uncomplicated: Secondary | ICD-10-CM

## 2015-09-11 LAB — CBC WITH DIFFERENTIAL/PLATELET
BASOS PCT: 1 %
Basophils Absolute: 0.1 10*3/uL (ref 0.0–0.1)
EOS ABS: 0 10*3/uL (ref 0.0–0.7)
Eosinophils Relative: 1 %
HCT: 37.9 % (ref 36.0–46.0)
HEMOGLOBIN: 12.2 g/dL (ref 12.0–15.0)
LYMPHS PCT: 20 %
Lymphs Abs: 1.7 10*3/uL (ref 0.7–4.0)
MCH: 27.4 pg (ref 26.0–34.0)
MCHC: 32.2 g/dL (ref 30.0–36.0)
MCV: 85.2 fL (ref 78.0–100.0)
MONO ABS: 0.7 10*3/uL (ref 0.1–1.0)
Monocytes Relative: 9 %
NEUTROS ABS: 6.1 10*3/uL (ref 1.7–7.7)
NEUTROS PCT: 71 %
PLATELETS: 248 10*3/uL (ref 150–400)
RBC: 4.45 MIL/uL (ref 3.87–5.11)
RDW: 14.1 % (ref 11.5–15.5)
WBC: 8.6 10*3/uL (ref 4.0–10.5)

## 2015-09-11 LAB — COMPREHENSIVE METABOLIC PANEL
ALBUMIN: 3.8 g/dL (ref 3.5–5.0)
ALK PHOS: 51 U/L (ref 38–126)
ALT: 12 U/L — AB (ref 14–54)
ANION GAP: 12 (ref 5–15)
AST: 17 U/L (ref 15–41)
BUN: 6 mg/dL (ref 6–20)
CALCIUM: 9.2 mg/dL (ref 8.9–10.3)
CHLORIDE: 108 mmol/L (ref 101–111)
CO2: 19 mmol/L — AB (ref 22–32)
CREATININE: 0.75 mg/dL (ref 0.44–1.00)
GFR calc non Af Amer: >60 mL/min (ref >60)
GLUCOSE: 85 mg/dL (ref 65–99)
Potassium: 3.7 mmol/L (ref 3.5–5.1)
SODIUM: 139 mmol/L (ref 135–145)
Total Bilirubin: 1.4 mg/dL — ABNORMAL HIGH (ref 0.3–1.2)
Total Protein: 7.1 g/dL (ref 6.5–8.1)

## 2015-09-11 MED ORDER — TRAMADOL HCL 50 MG PO TABS: 50.0000 mg | ORAL_TABLET | Freq: Four times a day (QID) | ORAL | Status: DC | PRN

## 2015-09-11 NOTE — Procedures (Signed)
ELECTROENCEPHALOGRAM REPORT  Date of Study: 09/11/2015  Patient's Name: Renee Velez MRN: [id number] Date of Birth: [birth year]  Referring Provider: Dr. Rose FillersJames Armstrong  Clinical History: This is a 20 year old woman with recurrent seizure-like activity  Medications: ketorolac (TORADOL) 30 MG/ML injection 30 mg  lacosamide (VIMPAT) 100 mg in sodium chloride 0.9 % 25 mL IVPB  levETIRAcetam (KEPPRA) 1,000 mg in sodium chloride 0.9 % 100 mL IVPB  traMADol (ULTRAM) tablet 50 mg   Technical Summary: A multichannel digital 3-hour video EEG recording measured by the international 10-20 system with electrodes applied with paste and impedances below 5000 ohms performed in our laboratory with EKG monitoring in an awake and asleep patient.  Hyperventilation and photic stimulation were not performed.  The digital EEG was referentially recorded, reformatted, and digitally filtered in a variety of bipolar and referential montages for optimal display.    Description: The patient is awake and asleep during the recording.  During maximal wakefulness, there is a symmetric, medium voltage 10.5 Hz posterior dominant rhythm that attenuates with eye opening.  The record is symmetric.  There is an excess amount of diffuse low voltage beta activity seen throughout the recording. During drowsiness and sleep, there is an increase in theta slowing of the background. Vertex waves and symmetric sleep spindles were seen.  Hyperventilation and photic stimulation were not performed. There were 7 episodes of side to side head shaking with eyes open, asynchronous and irregular arm and leg shaking with flexion-extension of the legs seen. There were no associated epileptiform discharges seen with these episodes. There was one push button episode for staring and unresponsiveness, with no EEG correlate and normal wake background.  There were no epileptiform discharges or electrographic seizures seen.    EKG lead was  unremarkable.  Impression: This 3-hour video EEG is normal except for excess amount of diffuse low voltage beta activity. There were several episodes of irregular, asynchronous shaking and staring/unresponsiveness captured, with no EEG correlate seen.  Clinical Correlation: Diffuse low voltage beta activity is commonly seen with sedating medications such as benzodiazepines. 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   Patrcia DollyKaren Aquino, M.D.

## 2015-09-11 NOTE — Progress Notes (Signed)
Pt was found in the floor beside her bed, pt was lifted back into the bed, vital signs were taken and WNL.  No physical damage at this time to patient. MD on call was page and notified of the fall.  Safe Zone was submitted.

## 2015-09-11 NOTE — Progress Notes (Signed)
vLTM EEG running/ tested event button and educated friend in the room and nurse tech. Notified Neuro

## 2015-09-11 NOTE — Care Management Note (Signed)
Case Management Note  Patient Details  Name: Renee Velez MRN: [id number] Date of Birth: [birth year]  Subjective/Objective:    Patient discharged with boyfriend, she is to call [facility] for follow up apt which is on dc paper work.                  Action/Plan:   Expected Discharge Date:                  Expected Discharge Plan:  Home/Self Care  In-House Referral:     Discharge planning Services  CM Consult  Post Acute Care Choice:    Choice offered to:     DME Arranged:    DME Agency:     HH Arranged:    HH Agency:     Status of Service:  Completed, signed off  If discussed at [employer] Length of Stay Meetings, dates discussed:    Additional Comments:  Leone Havenaylor, Aundraya Dripps Clinton, RN 09/11/2015, 3:18 PM

## 2015-09-11 NOTE — Progress Notes (Addendum)
Subjective:  Renee Velez is a 20 year old patient admitted to the hospital for seizure like activity. Yesterday she had 4 events suspicious for possible seizures. However, she was noted to follow commands immediately after the event but seemed a bit combative and would not communicate.  Yesterday afternoon the nursing team reported that [name] does not like being on Keppra. When asked about this issue this morning she had minimal communication and was not providing any additional information on this.  Burgess EstelleYesterday [name] stated that she would leave the hospital AGAINST MEDICAL ADVICE. She refused to the video long-term monitoring.  This morning rating indicated a willingness to proceed with long-term monitoring. There are no reports of any additional seizure-like activity overnight. She continues on the combination of Keppra and Vimpat.  Exam: Vitals:   09/11/15 0135 09/11/15 0802  BP: 91/73 (!) 110/56  Pulse: 79 93  Resp: 12 18  Temp: 97.8 F (36.6 C) 98 F (36.7 C)    HEENT-  Normocephalic, no lesions, without obvious abnormality.  Normal external eye and conjunctiva.  Normal TM's bilaterally.  Normal auditory canals and external ears. Normal external nose, mucus membranes and septum.  Normal pharynx. Cardiovascular- regular rate and rhythm, S1, S2 normal, no murmur, click, rub or gallop, pulses palpable throughout   Lungs- chest clear, no wheezing, rales, normal symmetric air entry, Heart exam - S1, S2 normal, no murmur, no gallop, rate regular Abdomen- soft, non-tender; bowel sounds normal; no masses,  no organomegaly Extremities- less then 2 second capillary refill Lymph-no adenopathy palpable Musculoskeletal-no joint tenderness, deformity or swelling Skin-warm and dry, no hyperpigmentation, vitiligo, or suspicious lesions    Gen: In bed, NAD MS: Renee Velez seems unwilling to make a full effort to communicate. She is following commands in all 4 extremities. CN: Pupils are equal round and  reactive. There is no facial asymmetry. Motor: Antigravity strength in all 4 extremities. Sensory: Grossly intact.   Pertinent Labs/Diagnostics: Reviewed    Impression:   Tesla had 4 events yesterday suspicious for seizure-like activity. Unfortunately, she refused video EEG monitoring to determine the etiology of these events. The nursing staff reports that she does not like being on Keppra. However, it is unclear what the reason is for that. Renee Velez provided no additional explanation this morning and seemed unwilling to communicate.  Renee Velez claimed she would be leaving the hospital AGAINST MEDICAL ADVICE yesterday. Nursing staff reports that no family were willing to come and provide her transportation to home.  Renee Velez indicates a willingness to proceed with her evaluations. Video EEG will be requested. We will continue with her present antiepileptic therapy. If Renee Velez has a reason for switching from Keppra to a different medication we can certainly discuss this. It might be best to address this on an outpatient basis and use the Keppra to control the immediate seizure-like activity. She also continues on the Vimpat at this time.   Recommendations:  1. Continue present therapy with Keppra and Vimpat.  2. Video EEG monitoring today.   James A. Hilda BladesArmstrong, M.D. Neurohospitalist Phone: [phone]   09/11/2015, 8:57 AM  Addendum:  Video EEG was initiated this morning. Renee Velez has already experienced numerous events. The semiology of these events consist of back and forwards movements of the head that are fairly rapid and high in amplitude. Intermittently she will have some thrashing movements of the upper and lower extremities but these are not consistent. There is no distinct post ictal. Associated with these events. There is no evidence of Todd's paralysis.  During  these events the EEG reveals her normal baseline activity. The EEG is notable for increased beta activity which is typically  seen with the administration of sedative and/or hypnotic medications such as her Ativan. During these events there were no epileptiform discharges and muscle artifact was noted.  The patient and her boyfriend were notified that these events are nonepileptic in nature and typically considered stress-related. The Vimpat will be discontinued. The Keppra will be continued at the present dose. This can be tapered off gradually on an outpatient basis. However Kenslei had discontinued the Keppra on her own prior to arrival at the hospital. These events do not require Ativan unless the psychiatry team feels it would be of additional benefit. The EEG will be disconnected at this time.  At this point psychiatry evaluation will be requested. It is suspected that Renee Velez would likely benefit from inpatient psychiatric care.   [name], M.D. Neuro hospitalist

## 2015-09-11 NOTE — Discharge Summary (Signed)
DISCHARGE SUMMARY  Renee Velez  MR#: [id number]  DOB:November 04, 1995  Date of Admission: 09/09/2015 Date of Discharge: 09/11/2015  Attending Physician:Anaika Santillano T  Patient's PCP:No PCP Per Patient  Consults: Neurology Psychiatry  Disposition: D/C home at pt request   Follow-up Appts: Follow-up Information    [facility] .   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  Per patient's boyfriend, TJ, she seemedto have had anabsence seizures earlier in the evening. Several minutes later, her boyfriend states that she started having tonic-clonic movements, but denies urinary or fecalincontinence, tongue biting or any other trauma. He states that he subsequently carriedher to his vehicle and brought her to the emergency department. She has been out of her medications, including her Keppra, for the past month and a half.  Hospital Course:  Non-epileptic convulsions  The patient was seen in consultation with neurology who was able to perform video EEG monitoring which revealed that the patient's episodes are nonepileptic in nature - neurology discontinued her Vimpat and suggested her Keppra should be tapered gradually on an outpatient basis with current continued use at the dose reported prior to admission - it is suggested that benzodiazepines not be used for these episodes - Psychiatry was consulted to evaluate the patient but did not suggest forced inpatient psychiatric treatment and the  patient herself desired discharge home  Hypokalemia Corrected to a normal level with supplementation  Bipolar 1 disorder  It is reported the patient is not compliant with her prescribed medications - compliance and regular outpatient follow-up R suggested  Tobacco use disorder The patient was counseled on the need to discontinue tobacco abuse     Medication List    TAKE these medications   busPIRone 10 MG tablet Commonly known as:  BUSPAR Take 10 mg by mouth 3 (three) times daily.   folic acid 1 MG tablet Commonly known as:  FOLVITE Take 1 mg by mouth 4 (four) times daily.   levETIRAcetam 750 MG tablet Commonly known as:  KEPPRA Take 750 mg by mouth 2 (two) times daily. What changed:  Another medication with the same name was removed. Continue taking this medication, and follow the directions you see here.       Day of Discharge BP 111/66 (BP Location: Left Arm)   Pulse 71   Temp 98.7 F (37.1 C) (Axillary)   Resp 17   Ht 5\' 4"  (1.626 m)   Wt 55 kg (121 lb 4.1 oz)   LMP 08/04/2015   SpO2 100%   BMI 20.81 kg/m   Physical Exam: General: No acute respiratory distress Lungs: Clear to auscultation bilaterally without wheezes or crackles Cardiovascular: Regular rate and rhythm without murmur gallop or rub normal S1 and S2 Abdomen: Nontender, nondistended, soft, bowel sounds positive, no rebound, no ascites, no appreciable mass Extremities: No significant cyanosis, clubbing, or edema bilateral lower extremities  Basic Metabolic Panel:  Recent Labs Lab 09/09/15 1545 09/09/15 2252 09/10/15 0557 09/10/15 1425 09/11/15 0434  NA 137 140  --  137 139  K 3.9 3.4*  --  3.6 3.7  CL 106 102  --  108 108  CO2 25  --   --  22 19*  GLUCOSE 87 81  --  85 85  BUN 13 8  --  6 6  CREATININE 0.86 0.80 0.79 0.74 0.75  CALCIUM 9.3  --   --  9.0 9.2    Liver Function Tests:  Recent Labs Lab 09/09/15 1545 09/10/15 1425 09/11/15 0434  AST ALT 13* 12* 12*    ALKPHOS 61 51 51  BILITOT 1.0 0.9 1.4*  PROT 8.2* 6.6 7.1  ALBUMIN 4.5 3.7 3.8    Recent Labs Lab 09/10/15 1425  LIPASE 35   CBC:  Recent Labs Lab 09/09/15 1545 09/09/15 2252 09/10/15 0557 09/11/15 0434  WBC 7.8  --  9.6 8.6  NEUTROABS 4.1  --   --  6.1  HGB 12.9 13.6 12.0 12.2  HCT 39.9 40.0 37.8 37.9  MCV 85.8  --  85.9 85.2  PLT 282  --  267 248    CBG:  Recent Labs Lab 09/09/15 2242 09/10/15 0211  GLUCAP 93 86    Recent Results (from the past 240 hour(s))  MRSA PCR Screening     Status: None   Collection Time: 09/10/15  3:56 AM  Result Value Ref Range Status   MRSA by PCR NEGATIVE NEGATIVE Final    Comment:        The GeneXpert MRSA Assay (FDA approved for NASAL specimens only), is one component of a comprehensive MRSA colonization surveillance program. It is not intended to diagnose MRSA infection nor to guide or monitor treatment for MRSA infections.      Time spent in discharge (includes decision making & examination of pt): <30 minutes  09/11/2015, 1:53 PM   Lonia Blood, MD [facility] Office  [phone] Pager [phone]  On-Call/Text Page:      [email or web address]      password [facility]

## 2015-09-11 NOTE — Discharge Instructions (Signed)
Nonepileptic Seizures °Nonepileptic seizures are seizures that are not caused by abnormal electrical signals in your brain. These seizures often seem like epileptic seizures, but they are not caused by epilepsy.  °There are two types of nonepileptic seizures: °· A physiologic nonepileptic seizure results from a disruption in your brain. °· A psychogenic seizure results from emotional stress. These seizures are sometimes called pseudoseizures. °CAUSES  °Causes of physiologic nonepileptic seizures include:  °· Sudden drop in blood pressure. °· Low blood sugar. °· Low levels of salt (sodium) in your blood. °· Low levels of calcium in your blood. °· Migraine. °· Heart rhythm problems. °· Sleep disorders. °· Drug and alcohol abuse. °Common causes of psychogenic nonepileptic seizures include: °· Stress. °· Emotional trauma. °· Sexual or physical abuse. °· Major life events, such as divorce or the death of a loved one. °· Mental health disorders, including panic attack and hyperactivity disorder. °SIGNS AND SYMPTOMS °A nonepileptic seizure can look like an epileptic seizure, including uncontrollable shaking (convulsions), or changes in attention, behavior, or the ability to remain awake and alert. However, there are some differences. Nonepileptic seizures usually: °· Do not cause physical injuries. °· Start slowly. °· Include crying or shrieking. °· Last longer than 2 minutes. °· Have a short recovery time without headache or exhaustion. °DIAGNOSIS  °Your health care provider can usually diagnose nonepileptic seizures after taking your medical history and giving you a physical exam. Your health care provider may want to talk to your friends or relatives who have seen you have a seizure.  °You may also need to have tests to look for causes of physiologic nonepileptic seizures. This may include an electroencephalogram (EEG), which is a test that measures electrical activity in your brain. If you have had an epileptic  seizure, the results of your EEG will be abnormal. If your health care provider thinks you have had a psychogenic nonepileptic seizure, you may need to see a mental health specialist for an evaluation. °TREATMENT  °Treatment depends on the type and cause of your seizures. °· For physiologic nonepileptic seizures, treatment is aimed at addressing the underlying condition that caused the seizures. These seizures usually stop when the underlying condition is properly treated. °· Nonepileptic seizures do not respond to the seizure medicines used to treat epilepsy. °· For psychogenic seizures, you may need to work with a mental health specialist. °HOME CARE INSTRUCTIONS °Home care will depend on the type of nonepileptic seizures you have.  °· Follow all your health care provider's instructions. °· Keep all your follow-up appointments. °SEEK MEDICAL CARE IF: °You continue to have seizures after treatment. °SEEK IMMEDIATE MEDICAL CARE IF: °· Your seizures change or become more frequent. °· You injure yourself during a seizure. °· You have one seizure after another. °· You have trouble recovering from a seizure. °· You have chest pain or trouble breathing. °MAKE SURE YOU: °· Understand these instructions. °· Will watch your condition. °· Will get help right away if you are not doing well or get worse. °  °This information is not intended to replace advice given to you by your health care provider. Make sure you discuss any questions you have with your health care provider. °  °Document Released: 03/06/2005 Document Revised: 02/09/2014 Document Reviewed: 11/15/2012 °Elsevier Interactive Patient Education ©2016 [employer] ° °

## 2015-09-11 NOTE — Progress Notes (Signed)
Patient to be discharged home with family. PIV and cardiac removed by unit CN. Discharge instructions reviewed with patient and patient's boyfriend by unit CN.   Leanna BattlesEckelmann, Mellanie Bejarano Eileen, RN.
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
## 2015-09-27 ENCOUNTER — Emergency Department (HOSPITAL_COMMUNITY)
Admission: EM | Admit: 2015-09-27 | Discharge: 2015-09-27 | Disposition: A | Discharge status: Home / Self Care | Payer: Self-pay | Attending: Emergency Medicine | Admitting: Emergency Medicine

## 2015-09-27 ENCOUNTER — Encounter (HOSPITAL_COMMUNITY): Payer: Self-pay | Admitting: Emergency Medicine

## 2015-09-27 DIAGNOSIS — R569 Unspecified convulsions: Secondary | ICD-10-CM | POA: Insufficient documentation

## 2015-09-27 DIAGNOSIS — F17200 Nicotine dependence, unspecified, uncomplicated: Secondary | ICD-10-CM | POA: Insufficient documentation

## 2015-09-27 DIAGNOSIS — Z5321 Procedure and treatment not carried out due to patient leaving prior to being seen by health care provider: Secondary | ICD-10-CM | POA: Insufficient documentation

## 2015-09-27 NOTE — ED Notes (Signed)
Off duty [employer] officer updated on pt.  When he went into triage room to speak with pt she was lying on the floor with "seizure like" activity and her child was crawling on the floor beside her.  (Pt was previously sitting in the floor and had child crawling on floor and this RN had encouraged her not to be in floor with her child.)   When pt was rolled over and placed in stretcher chair the shaking stopped.  Pt withdraws from pain but is non-verbal at this time.  Pt taken straight to treatment room.

## 2015-09-27 NOTE — ED Notes (Signed)
RN to room to assess pt, pt not in room. RN informed by staff, pt left, stated she does not want to stay.
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
## 2015-09-27 NOTE — ED Notes (Signed)
This RN in room to speak with patient. Pt states that she does not want to give details based on why she came in. Pt endorses that she has epilepsy and that she does not take her keppra. Pt was here recently for seizures and given IV Keppra. Pt talked to by this RN and agrees to medical exam. Pt states "I do not want a SANE nurse" Pt alert and oriented x 4. Airway intact.

## 2015-09-28 NOTE — ED Provider Notes (Signed)
Patient left without being seen  1. Patient left without being seen        Melene Planan Ethlyn Alto, DO 09/28/15 0451

## 2015-09-30 ENCOUNTER — Inpatient Hospital Stay (HOSPITAL_COMMUNITY)
Admission: AD | Admit: 2015-09-30 | Discharge: 2015-09-30 | Discharge status: Against Medical Advice | Payer: Self-pay | Source: Ambulatory Visit | Attending: Family Medicine | Admitting: Family Medicine

## 2015-09-30 ENCOUNTER — Encounter (HOSPITAL_COMMUNITY): Payer: Self-pay | Admitting: *Deleted

## 2015-09-30 DIAGNOSIS — T7421XD Adult sexual abuse, confirmed, subsequent encounter: Secondary | ICD-10-CM

## 2015-09-30 DIAGNOSIS — Z5321 Procedure and treatment not carried out due to patient leaving prior to being seen by health care provider: Secondary | ICD-10-CM | POA: Insufficient documentation

## 2015-09-30 HISTORY — DX: Gonococcal infection, unspecified: A54.9

## 2015-09-30 HISTORY — DX: Chlamydial infection, unspecified: A74.9
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
## 2015-09-30 NOTE — MAU Note (Signed)
SANE nurse informed of referral, states she is with a patient, will be over an hour before she can come.
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
## 2015-09-30 NOTE — MAU Note (Signed)
Pt told CNM she wants to go home.  RN spoke with pt, informed her we do not have to notify the police if she does not want us to.  Encouraged her to stay & speak with SANE nurse.  Pt crying, repeated several times  "I just want to go home."  Informed pt we will try to help in any way possible, she again states she wants to leave.  Encouraged pt to return to MAU if she changes her mind.

## 2015-11-30 ENCOUNTER — Emergency Department (HOSPITAL_COMMUNITY)
Admission: EM | Admit: 2015-11-30 | Discharge: 2015-11-30 | Disposition: A | Discharge status: Home / Self Care | Payer: Self-pay | Attending: Emergency Medicine | Admitting: Emergency Medicine

## 2015-11-30 ENCOUNTER — Encounter (HOSPITAL_COMMUNITY): Payer: Self-pay | Admitting: Emergency Medicine

## 2015-11-30 DIAGNOSIS — N760 Acute vaginitis: Secondary | ICD-10-CM | POA: Insufficient documentation

## 2015-11-30 DIAGNOSIS — B9689 Other specified bacterial agents as the cause of diseases classified elsewhere: Secondary | ICD-10-CM

## 2015-11-30 DIAGNOSIS — B9789 Other viral agents as the cause of diseases classified elsewhere: Secondary | ICD-10-CM | POA: Insufficient documentation

## 2015-11-30 DIAGNOSIS — F17200 Nicotine dependence, unspecified, uncomplicated: Secondary | ICD-10-CM | POA: Insufficient documentation

## 2015-11-30 LAB — COMPREHENSIVE METABOLIC PANEL
ALBUMIN: 4.4 g/dL (ref 3.5–5.0)
ALT: 16 U/L (ref 14–54)
ANION GAP: 6 (ref 5–15)
AST: 22 U/L (ref 15–41)
Alkaline Phosphatase: 50 U/L (ref 38–126)
BILIRUBIN TOTAL: 0.7 mg/dL (ref 0.3–1.2)
BUN: 13 mg/dL (ref 6–20)
CHLORIDE: 107 mmol/L (ref 101–111)
CO2: 25 mmol/L (ref 22–32)
Calcium: 9.1 mg/dL (ref 8.9–10.3)
Creatinine, Ser: 0.91 mg/dL (ref 0.44–1.00)
GFR calc Af Amer: >60 mL/min (ref >60)
GFR calc non Af Amer: >60 mL/min (ref >60)
GLUCOSE: 89 mg/dL (ref 65–99)
POTASSIUM: 3.7 mmol/L (ref 3.5–5.1)
SODIUM: 138 mmol/L (ref 135–145)
TOTAL PROTEIN: 8 g/dL (ref 6.5–8.1)

## 2015-11-30 LAB — LIPASE, BLOOD: LIPASE: 31 U/L (ref 11–51)

## 2015-11-30 LAB — CBC
HEMATOCRIT: 38 % (ref 36.0–46.0)
HEMOGLOBIN: 12.5 g/dL (ref 12.0–15.0)
MCH: 28.8 pg (ref 26.0–34.0)
MCHC: 32.9 g/dL (ref 30.0–36.0)
MCV: 87.6 fL (ref 78.0–100.0)
Platelets: 291 10*3/uL (ref 150–400)
RBC: 4.34 MIL/uL (ref 3.87–5.11)
RDW: 13.6 % (ref 11.5–15.5)
WBC: 7.7 10*3/uL (ref 4.0–10.5)

## 2015-11-30 LAB — URINALYSIS, ROUTINE W REFLEX MICROSCOPIC
BILIRUBIN URINE: NEGATIVE
GLUCOSE, UA: NEGATIVE mg/dL
Hgb urine dipstick: NEGATIVE
KETONES UR: NEGATIVE mg/dL
Leukocytes, UA: NEGATIVE
NITRITE: NEGATIVE
PH: 6.5 (ref 5.0–8.0)
Protein, ur: NEGATIVE mg/dL
SPECIFIC GRAVITY, URINE: 1.026 (ref 1.005–1.030)

## 2015-11-30 LAB — WET PREP, GENITAL
SPERM: NONE SEEN
Trich, Wet Prep: NONE SEEN
YEAST WET PREP: NONE SEEN

## 2015-11-30 LAB — POC URINE PREG, ED: Preg Test, Ur: NEGATIVE

## 2015-11-30 MED ORDER — IBUPROFEN 800 MG PO TABS
800.0000 mg | ORAL_TABLET | Freq: Once | ORAL | Status: AC
  Administered 2015-11-30: 800 mg via ORAL
  Filled 2015-11-30: qty 1

## 2015-11-30 MED ORDER — METRONIDAZOLE 500 MG PO TABS
500.0000 mg | ORAL_TABLET | Freq: Once | ORAL | Status: AC
  Administered 2015-11-30: 500 mg via ORAL
  Filled 2015-11-30: qty 1

## 2015-11-30 MED ORDER — METRONIDAZOLE 500 MG PO TABS: 500.0000 mg | ORAL_TABLET | Freq: Two times a day (BID) | ORAL | 0 refills | Status: DC

## 2015-11-30 NOTE — ED Triage Notes (Signed)
Pt from home with complaints of emesis x 3 days. Pt states she feels fine for most of the day and then feels nauseous around midnight. Pt states that she has had about 7 episodes of emesis today. Pt states that the last few times she experienced emesis today she had a slight amount of blood in it. Pt states she thinks it was from her throat being raw, because her throat is now hurting. Pt states she has pain in the right lower quadrant. Pt denies diarrhea and denies urinary symptoms.

## 2015-11-30 NOTE — ED Provider Notes (Addendum)
[facility]-EMERGENCY DEPT Provider Note   CSN: [id number] Arrival date & time: 11/30/15  0217  By signing my name below, I, Nelwyn Salisbury, attest that this documentation has been prepared under the direction and in the presence of Yancy Hascall, MD . Electronically Signed: Nelwyn Salisbury, Scribe. 11/30/2015. 3:53 AM.  History   Chief Complaint Chief Complaint  Patient presents with  . Abdominal Pain  . Emesis   The history is provided by the patient. No language interpreter was used.  Abdominal Pain   This is a new problem. The current episode started more than 2 days ago. The problem occurs constantly. The problem has not changed since onset.The pain is associated with an unknown factor. The pain is located in the suprapubic region. The pain is mild. Associated symptoms include nausea and vomiting. Pertinent negatives include fever, hematochezia, constipation, dysuria, frequency and myalgias. Nothing aggravates the symptoms. Nothing relieves the symptoms. Past workup does not include GI consult. Her past medical history does not include PUD.  Emesis   Associated symptoms include abdominal pain. Pertinent negatives include no fever and no myalgias.   HPI Comments:  Renee Velez is a 20 y.o. female with pmhx of chlamydia and gonorrhea who presents to the Emergency Department complaining of sudden-onset constant unchanged abdominal pain beginning 3 days ago. Pt describes her pain as being localized to her right lower quadrant. No modifying factors indicated. She states that her LNMP was Sept. 18, 2017 and that she is about 10 days late which is unusual for her. She reports associated emesis x3 and nausea. Pt denies any vaginal discharge or constipation. She also denies any new medications, new partners, recent trauma, falls or heavy lifting.   Past Medical History:  Diagnosis Date  . Bipolar 1 disorder (HCC)   . Chlamydia   . Gonorrhea   . Seizure [facility])     Patient Active Problem List   Diagnosis Date Noted  . Seizure disorder (HCC) 09/10/2015  . Bipolar 1 disorder (HCC) 09/10/2015  . Tobacco use disorder 09/10/2015    Past Surgical History:  Procedure Laterality Date  . NO PAST SURGERIES      OB History    Gravida Para Term Preterm AB Living   2 1 1   1      SAB TAB Ectopic Multiple Live Births   1       1       Home Medications    Prior to Admission medications   Medication Sig Start Date End Date Taking? Authorizing Provider  busPIRone (BUSPAR) 10 MG tablet Take 10 mg by mouth 3 (three) times daily.    Historical Provider, MD  folic acid (FOLVITE) 1 MG tablet Take 1 mg by mouth 4 (four) times daily.    Historical Provider, MD  levETIRAcetam (KEPPRA) 750 MG tablet Take 750 mg by mouth 2 (two) times daily.    Historical Provider, MD    Family History No family history on file.  Social History Social History  Substance Use Topics  . Smoking status: Current Every Day Smoker    Packs/day: 1.00  . Smokeless tobacco: Never Used  . Alcohol use No     Comment: several drinks of liquor a day     Allergies   Nickel   Review of Systems Review of Systems  Constitutional: Negative for fever.  Gastrointestinal: Positive for abdominal pain, nausea and vomiting. Negative for constipation and hematochezia.  Genitourinary: Negative for dysuria and frequency.  Musculoskeletal: Negative for myalgias.  All other systems reviewed and are negative.    Physical Exam Updated Vital Signs BP 115/63 (BP Location: Right Arm)   Pulse 67   Temp 98.3 F (36.8 C) (Oral)   Resp 20   Ht 5\' 4"  (1.626 m)   Wt 110 lb (49.9 kg)   LMP 10/17/2015   SpO2 100%   BMI 18.88 kg/m   Physical Exam  Constitutional: She appears well-developed and well-nourished.  HENT:  Mouth/Throat: Oropharynx is clear and moist. No oropharyngeal exudate.  Eyes: Pupils are equal, round, and reactive to light.  Neck: No tracheal deviation present.  No bruit  Cardiovascular: Normal rate,  regular rhythm and normal heart sounds.  Exam reveals no friction rub.   Pulmonary/Chest: Effort normal and breath sounds normal. No stridor.  Abdominal: Soft. Bowel sounds are normal. She exhibits no mass. There is no hepatosplenomegaly. There is no tenderness. There is no rigidity, no rebound, no guarding, no tenderness at McBurney's point and negative Murphy's sign.  No McBurney's point tenderness  Genitourinary:  Genitourinary Comments: Chaperone present scant Sollenberger discharge.  No CMT or adnexal tenderness  Musculoskeletal: Normal range of motion.  Neurological: She is alert. She has normal reflexes.  Skin: Skin is warm and dry. Capillary refill takes less than 2 seconds.  Psychiatric: She has a normal mood and affect.     ED Treatments / Results  DIAGNOSTIC STUDIES:  Oxygen Saturation is 100% on RA, normal by my interpretation.    Vitals:   11/30/15 0228  BP: 115/63  Pulse: 67  Resp: 20  Temp: 98.3 F (36.8 C)   Results for orders placed or performed during the hospital encounter of 11/30/15  Wet prep, genital  Result Value Ref Range   Yeast Wet Prep HPF POC NONE SEEN NONE SEEN   Trich, Wet Prep NONE SEEN NONE SEEN   Clue Cells Wet Prep HPF POC PRESENT (A) NONE SEEN   WBC, Wet Prep HPF POC FEW (A) NONE SEEN   Sperm NONE SEEN   Lipase, blood  Result Value Ref Range   Lipase 31 11 - 51 U/L  Comprehensive metabolic panel  Result Value Ref Range   Sodium 138 135 - 145 mmol/L   Potassium 3.7 3.5 - 5.1 mmol/L   Chloride 107 101 - 111 mmol/L   CO2 25 22 - 32 mmol/L   Glucose, Bld 89 65 - 99 mg/dL   BUN 13 6 - 20 mg/dL   Creatinine, Ser 3.080.91 0.44 - 1.00 mg/dL   Calcium 9.1 8.9 - 65.710.3 mg/dL   Total Protein 8.0 6.5 - 8.1 g/dL   Albumin 4.4 3.5 - 5.0 g/dL   AST 22 15 - 41 U/L   ALT 16 14 - 54 U/L   Alkaline Phosphatase 50 38 - 126 U/L   Total Bilirubin 0.7 0.3 - 1.2 mg/dL   GFR calc non Af Amer >60 >60 mL/min   GFR calc Af Amer >60 >60 mL/min   Anion gap 6 5 - 15    CBC  Result Value Ref Range   WBC 7.7 4.0 - 10.5 K/uL   RBC 4.34 3.87 - 5.11 MIL/uL   Hemoglobin 12.5 12.0 - 15.0 g/dL   HCT 84.638.0 96.236.0 - 95.246.0 %   MCV 87.6 78.0 - 100.0 fL   MCH 28.8 26.0 - 34.0 pg   MCHC 32.9 30.0 - 36.0 g/dL   RDW 84.113.6 32.411.5 - 40.115.5 %   Platelets 291 150 - 400 K/uL  Urinalysis, Routine w  reflex microscopic  Result Value Ref Range   Color, Urine YELLOW YELLOW   APPearance CLEAR CLEAR   Specific Gravity, Urine 1.026 1.005 - 1.030   pH 6.5 5.0 - 8.0   Glucose, UA NEGATIVE NEGATIVE mg/dL   Hgb urine dipstick NEGATIVE NEGATIVE   Bilirubin Urine NEGATIVE NEGATIVE   Ketones, ur NEGATIVE NEGATIVE mg/dL   Protein, ur NEGATIVE NEGATIVE mg/dL   Nitrite NEGATIVE NEGATIVE   Leukocytes, UA NEGATIVE NEGATIVE  POC urine preg, ED  Result Value Ref Range   Preg Test, Ur NEGATIVE NEGATIVE   No results found. Medications  ibuprofen (ADVIL,MOTRIN) tablet 800 mg (not administered)  metroNIDAZOLE (FLAGYL) tablet 500 mg (not administered)     Procedures Procedures (including critical care time)  Final Clinical Impressions(s) / ED Diagnoses  BV; Exam and vitals are benign and reassuring.  I do not believe the patient required imaging at this time.  Will treat for BV.  Strict return precautions given.  All questions answered to patient's parents satisfaction. Based on history and exam patient has been appropriately medically screened and emergency conditions excluded. Patient is stable for discharge at this time. Follow up with your PMD for recheck in 2 days and strict return precautions given.   I personally performed the services described in this documentation, which was scribed in my presence. The recorded information has been reviewed and is accurate.      Cy BlamerApril Trenyce Loera, MD 11/30/15 [id number]    Nabria Nevin, MD 11/30/15 [id number]

## 2015-12-02 ENCOUNTER — Encounter (HOSPITAL_COMMUNITY): Payer: Self-pay | Admitting: Emergency Medicine

## 2015-12-02 ENCOUNTER — Emergency Department (HOSPITAL_COMMUNITY): Payer: Self-pay

## 2015-12-02 ENCOUNTER — Emergency Department (HOSPITAL_COMMUNITY)
Admission: EM | Admit: 2015-12-02 | Discharge: 2015-12-02 | Disposition: A | Discharge status: Home / Self Care | Payer: Self-pay | Attending: Emergency Medicine | Admitting: Emergency Medicine

## 2015-12-02 DIAGNOSIS — R52 Pain, unspecified: Secondary | ICD-10-CM

## 2015-12-02 DIAGNOSIS — W228XXA Striking against or struck by other objects, initial encounter: Secondary | ICD-10-CM | POA: Insufficient documentation

## 2015-12-02 DIAGNOSIS — S60511A Abrasion of right hand, initial encounter: Secondary | ICD-10-CM | POA: Insufficient documentation

## 2015-12-02 DIAGNOSIS — F17200 Nicotine dependence, unspecified, uncomplicated: Secondary | ICD-10-CM | POA: Insufficient documentation

## 2015-12-02 DIAGNOSIS — Y929 Unspecified place or not applicable: Secondary | ICD-10-CM | POA: Insufficient documentation

## 2015-12-02 DIAGNOSIS — Y939 Activity, unspecified: Secondary | ICD-10-CM | POA: Insufficient documentation

## 2015-12-02 DIAGNOSIS — Y999 Unspecified external cause status: Secondary | ICD-10-CM | POA: Insufficient documentation

## 2015-12-02 DIAGNOSIS — S6991XA Unspecified injury of right wrist, hand and finger(s), initial encounter: Secondary | ICD-10-CM

## 2015-12-02 LAB — GC/CHLAMYDIA PROBE AMP (~~LOC~~) NOT AT ARMC
CHLAMYDIA, DNA PROBE: NEGATIVE
Neisseria Gonorrhea: NEGATIVE
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
## 2015-12-02 NOTE — Discharge Instructions (Signed)
Please read attached information. If you experience any new or worsening signs or symptoms please return to the emergency room for evaluation. Please follow-up with your primary care provider or specialist as discussed.  °

## 2016-02-03 DIAGNOSIS — F53 Postpartum depression: Secondary | ICD-10-CM

## 2016-02-03 HISTORY — DX: Postpartum depression: F53.0

## 2016-02-29 ENCOUNTER — Emergency Department (HOSPITAL_COMMUNITY)
Admission: EM | Admit: 2016-02-29 | Discharge: 2016-02-29 | Disposition: A | Discharge status: Home / Self Care | Payer: Self-pay | Attending: Emergency Medicine | Admitting: Emergency Medicine

## 2016-02-29 ENCOUNTER — Encounter (HOSPITAL_COMMUNITY): Payer: Self-pay | Admitting: Emergency Medicine

## 2016-02-29 DIAGNOSIS — O26891 Other specified pregnancy related conditions, first trimester: Secondary | ICD-10-CM | POA: Insufficient documentation

## 2016-02-29 DIAGNOSIS — R55 Syncope and collapse: Secondary | ICD-10-CM | POA: Insufficient documentation

## 2016-02-29 DIAGNOSIS — F17200 Nicotine dependence, unspecified, uncomplicated: Secondary | ICD-10-CM | POA: Insufficient documentation

## 2016-02-29 DIAGNOSIS — Z3A01 Less than 8 weeks gestation of pregnancy: Secondary | ICD-10-CM | POA: Insufficient documentation

## 2016-02-29 DIAGNOSIS — O26899 Other specified pregnancy related conditions, unspecified trimester: Secondary | ICD-10-CM

## 2016-02-29 DIAGNOSIS — Z79899 Other long term (current) drug therapy: Secondary | ICD-10-CM | POA: Insufficient documentation

## 2016-02-29 DIAGNOSIS — O99331 Smoking (tobacco) complicating pregnancy, first trimester: Secondary | ICD-10-CM | POA: Insufficient documentation

## 2016-02-29 DIAGNOSIS — R102 Pelvic and perineal pain: Secondary | ICD-10-CM | POA: Insufficient documentation

## 2016-02-29 LAB — URINALYSIS, ROUTINE W REFLEX MICROSCOPIC
Bilirubin Urine: NEGATIVE
GLUCOSE, UA: NEGATIVE mg/dL
HGB URINE DIPSTICK: NEGATIVE
Ketones, ur: NEGATIVE mg/dL
Leukocytes, UA: NEGATIVE
Nitrite: NEGATIVE
Protein, ur: NEGATIVE mg/dL
SPECIFIC GRAVITY, URINE: 1.024 (ref 1.005–1.030)
pH: 6 (ref 5.0–8.0)

## 2016-02-29 LAB — BASIC METABOLIC PANEL
Anion gap: 3 — ABNORMAL LOW (ref 5–15)
BUN: 7 mg/dL (ref 6–20)
CALCIUM: 8.7 mg/dL — AB (ref 8.9–10.3)
CO2: 24 mmol/L (ref 22–32)
CREATININE: 0.66 mg/dL (ref 0.44–1.00)
Chloride: 109 mmol/L (ref 101–111)
GFR calc non Af Amer: >60 mL/min (ref >60)
Glucose, Bld: 89 mg/dL (ref 65–99)
Potassium: 4 mmol/L (ref 3.5–5.1)
Sodium: 136 mmol/L (ref 135–145)

## 2016-02-29 LAB — CBC WITH DIFFERENTIAL/PLATELET
BASOS PCT: 1 %
Basophils Absolute: 0.1 10*3/uL (ref 0.0–0.1)
EOS ABS: 0.1 10*3/uL (ref 0.0–0.7)
Eosinophils Relative: 2 %
HEMATOCRIT: 34.1 % — AB (ref 36.0–46.0)
Hemoglobin: 11.5 g/dL — ABNORMAL LOW (ref 12.0–15.0)
Lymphocytes Relative: 33 %
Lymphs Abs: 2.4 10*3/uL (ref 0.7–4.0)
MCH: 28.6 pg (ref 26.0–34.0)
MCHC: 33.7 g/dL (ref 30.0–36.0)
MCV: 84.8 fL (ref 78.0–100.0)
MONO ABS: 0.8 10*3/uL (ref 0.1–1.0)
Monocytes Relative: 11 %
Neutro Abs: 3.9 10*3/uL (ref 1.7–7.7)
Neutrophils Relative %: 53 %
Platelets: 268 10*3/uL (ref 150–400)
RBC: 4.02 MIL/uL (ref 3.87–5.11)
RDW: 13.2 % (ref 11.5–15.5)
WBC: 7.3 10*3/uL (ref 4.0–10.5)

## 2016-02-29 LAB — HCG, QUANTITATIVE, PREGNANCY: hCG, Beta Chain, Quant, S: 7656 m[IU]/mL — ABNORMAL HIGH (ref <5)

## 2016-02-29 LAB — POC URINE PREG, ED: PREG TEST UR: POSITIVE — AB

## 2016-02-29 NOTE — ED Provider Notes (Signed)
[facility]-EM[facility] DEPT Provider Note   CSN: [id number] Arrival date & time: 02/29/16  1135     History   Chief Complaint Chief Complaint  Patient presents with  . Loss of Consciousness    HPI Renee Velez is a 21 y.o. female.  The history is provided by the patient.  Loss of Consciousness   The current episode started more than 1 week ago. The problem occurs constantly. Progression since onset: waxing and waning 2-3 time a day. She lost consciousness for a period of less than one minute. The problem is associated with normal activity. Associated symptoms include abdominal pain (lower), dizziness and nausea. Associated symptoms comments: No vaginal discharge or bleeding. She has tried nothing for the symptoms.    Past Medical History:  Diagnosis Date  . Bipolar 1 disorder (HCC)   . Chlamydia   . Gonorrhea   . Seizure [facility](HCC)     Patient Active Problem List   Diagnosis Date Noted  . Seizure disorder (HCC) 09/10/2015  . Bipolar 1 disorder (HCC) 09/10/2015  . Tobacco use disorder 09/10/2015    Past Surgical History:  Procedure Laterality Date  . NO PAST SURGERIES      OB History    Gravida Para Term Preterm AB Living   2 1 1   1      SAB TAB Ectopic Multiple Live Births   1       1       Home Medications    Prior to Admission medications   Medication Sig Start Date End Date Taking? Authorizing Provider  metroNIDAZOLE (FLAGYL) 500 MG tablet Take 1 tablet (500 mg total) by mouth 2 (two) times daily. One po bid x 7 days 11/30/15   April Palumbo, MD    Family History History reviewed. No pertinent family history.  Social History Social History  Substance Use Topics  . Smoking status: Current Every Day Smoker    Packs/day: 1.00  . Smokeless tobacco: Never Used  . Alcohol use No     Comment: several drinks of liquor a day     Allergies   Nickel   Review of Systems Review of Systems  Cardiovascular: Positive for syncope.  Gastrointestinal: Positive for  abdominal pain (lower) and nausea.  Neurological: Positive for dizziness.  All other systems reviewed and are negative.    Physical Exam Updated Vital Signs BP 114/76 (BP Location: Left Arm)   Pulse 82   Temp 98.3 F (36.8 C) (Oral)   Resp 16   Ht 5\' 4"  (1.626 m)   LMP 02/23/2016   SpO2 100%   Physical Exam  Constitutional: She is oriented to person, place, and time. She appears well-developed and well-nourished. No distress.  HENT:  Head: Normocephalic.  Nose: Nose normal.  Eyes: Conjunctivae are normal.  Neck: Neck supple. No tracheal deviation present.  Cardiovascular: Normal rate, regular rhythm and normal heart sounds.   Pulmonary/Chest: Effort normal and breath sounds normal. No respiratory distress.  Abdominal: Soft. Normal appearance. She exhibits no distension. There is tenderness in the suprapubic area. There is no rigidity, no rebound and no guarding.  Neurological: She is alert and oriented to person, place, and time.  Skin: Skin is warm and dry.  Psychiatric: She has a normal mood and affect.  Vitals reviewed.    ED Treatments / Results  Labs (all labs ordered are listed, but only abnormal results are displayed) Labs Reviewed  URINALYSIS, ROUTINE W REFLEX MICROSCOPIC - Abnormal; Notable for the following:  Result Value   APPearance HAZY (*)    All other components within normal limits  CBC WITH DIFFERENTIAL/PLATELET - Abnormal; Notable for the following:    Hemoglobin 11.5 (*)    HCT 34.1 (*)    All other components within normal limits  BASIC METABOLIC PANEL - Abnormal; Notable for the following:    Calcium 8.7 (*)    Anion gap 3 (*)    All other components within normal limits  POC URINE PREG, ED - Abnormal; Notable for the following:    Preg Test, Ur POSITIVE (*)    All other components within normal limits  HCG, QUANTITATIVE, PREGNANCY    EKG  EKG Interpretation  Date/Time:  Saturday February 29 2016 12:51:55 EST Ventricular Rate:   66 PR Interval:    QRS Duration: 105 QT Interval:  379 QTC Calculation: 397 R Axis:   92 Text Interpretation:  Sinus rhythm Borderline right axis deviation Otherwise normal ECG Confirmed by Avrohom Mckelvin MD, Eknoor Novack ([id number]) on 02/29/2016 2:01:48 PM       Radiology No results found.  Procedures Procedures (including critical care time)  Medications Ordered in ED Medications - No data to display   Initial Impression / Assessment and Plan / ED Course  I have reviewed the triage vital signs and the nursing notes.  Pertinent labs & imaging results that were available during my care of the patient were reviewed by me and considered in my medical decision making (see chart for details).     21 year old G2 P1 001 presents with intermittent lower abdominal pain, several syncopal episodes over the last few weeks and nausea with some dizziness that started today. She was informed of the results of her pregnancy test and was surprised. Urine is reassuring. She has a 71-month-old daughter with her and does not feel comfortable leaving her with her friend in the lobby so she requests to leave. I explained that this AGAINST MEDICAL ADVICE but she demonstrated capacity and appears to be in no eminent danger.  She consented to lab work but refused pelvic exam and ultrasound to confirm location of pregnancy due to time constraints. With unknown hCG it is unclear if imaging would be indicated. She explained she could come back later tonight. I recommended she come back ASAP to complete her workup to rule out ectopic given the location of her pain but no reason to hold her against her will.  Date: 02/29/2016 Patient: Renee Velez  Renee Velez or her authorized caregiver has made the decision for the patient to leave the emergency department against the advice of Renee Pulley, MD. She or her authorized caregiver has been informed about any test results if available and understands the inherent risks of leaving,  including permanent disability or death. She or her authorized caregiver has decided to accept the responsibility for this decision. She or her authorized caregiver is not intoxicated and has demonstrated decision making capacity. Renee Velez and all necessary parties have been advised that she may return for further evaluation or treatment at any time and they are recommended alternative follow up if continually unwilling. Her condition at time of discharge was Stable.  Vital signs Blood pressure 114/76, pulse 82, temperature 98.3 F (36.8 C), temperature source Oral, resp. rate 16, height 5\' 4"  (1.626 m), last menstrual period 02/23/2016, SpO2 100 %.     Final Clinical Impressions(s) / ED Diagnoses   Final diagnoses:  Pelvic pain affecting pregnancy, antepartum  Syncope and collapse    New Prescriptions  New Prescriptions   No medications on file     Renee Pulley, MD 02/29/16 [phone]

## 2016-02-29 NOTE — ED Notes (Signed)
MD at bedside. 

## 2016-02-29 NOTE — ED Triage Notes (Signed)
Pt states that for the last 2 weeks she has been "fainting"  Pt states that last night she had 3 syncopal episodes nad today she has already had one.  Pt states that she will be standing and feel her knees give out and fall.  Pt does have Hx of epilepsy but states that these are no seizures.  Pt reports pain in both kidneys  And lower abdomen over the last 2 weeks.  Pt denies dizziness.

## 2016-02-29 NOTE — Discharge Instructions (Signed)
Please return to the emergency department immediately to complete your evaluation to rule out ectopic pregnancy.

## 2016-02-29 NOTE — ED Notes (Signed)
Dr. Clydene PughKnott made aware patient wants to leave AMA.

## 2016-03-04 ENCOUNTER — Encounter (HOSPITAL_COMMUNITY): Payer: Self-pay

## 2016-03-04 ENCOUNTER — Emergency Department (HOSPITAL_COMMUNITY)
Admission: EM | Admit: 2016-03-04 | Discharge: 2016-03-04 | Disposition: A | Discharge status: Home / Self Care | Payer: Self-pay | Attending: Emergency Medicine | Admitting: Emergency Medicine

## 2016-03-04 DIAGNOSIS — Z3A01 Less than 8 weeks gestation of pregnancy: Secondary | ICD-10-CM | POA: Insufficient documentation

## 2016-03-04 DIAGNOSIS — O99331 Smoking (tobacco) complicating pregnancy, first trimester: Secondary | ICD-10-CM | POA: Insufficient documentation

## 2016-03-04 DIAGNOSIS — B9689 Other specified bacterial agents as the cause of diseases classified elsewhere: Secondary | ICD-10-CM | POA: Insufficient documentation

## 2016-03-04 DIAGNOSIS — F17210 Nicotine dependence, cigarettes, uncomplicated: Secondary | ICD-10-CM | POA: Insufficient documentation

## 2016-03-04 DIAGNOSIS — O23591 Infection of other part of genital tract in pregnancy, first trimester: Secondary | ICD-10-CM | POA: Insufficient documentation

## 2016-03-04 DIAGNOSIS — O26851 Spotting complicating pregnancy, first trimester: Secondary | ICD-10-CM | POA: Insufficient documentation

## 2016-03-04 DIAGNOSIS — N760 Acute vaginitis: Secondary | ICD-10-CM

## 2016-03-04 LAB — URINALYSIS, ROUTINE W REFLEX MICROSCOPIC
Bilirubin Urine: NEGATIVE
Glucose, UA: NEGATIVE mg/dL
Hgb urine dipstick: NEGATIVE
KETONES UR: NEGATIVE mg/dL
Leukocytes, UA: NEGATIVE
NITRITE: NEGATIVE
PH: 7 (ref 5.0–8.0)
Protein, ur: NEGATIVE mg/dL
Specific Gravity, Urine: 1.003 — ABNORMAL LOW (ref 1.005–1.030)

## 2016-03-04 LAB — WET PREP, GENITAL
SPERM: NONE SEEN
TRICH WET PREP: NONE SEEN
YEAST WET PREP: NONE SEEN

## 2016-03-04 LAB — POC URINE PREG, ED: PREG TEST UR: POSITIVE — AB

## 2016-03-04 MED ORDER — METRONIDAZOLE 500 MG PO TABS: 500.0000 mg | ORAL_TABLET | Freq: Two times a day (BID) | ORAL | 0 refills | Status: DC

## 2016-03-04 NOTE — ED Provider Notes (Signed)
[facility] DEPT Provider Note   CSN: [id number] Arrival date & time: 03/04/16  1023     History   Chief Complaint Chief Complaint  Patient presents with  . Vaginal Bleeding    pregnant    HPI Renee Velez is a 21 y.o. female.  21 yo F with vaginal spotting and cramping.  G2P1.  No dysuria, no vaginal discharge.  Had an issue with prior pregnancy needing multiple doses of rhogam.  Denies other issue with pregnancy.    The history is provided by the patient.  Vaginal Bleeding  Primary symptoms include vaginal bleeding.  Primary symptoms include no dysuria. There has been no fever. The fever has been present for less than 1 day. This is a new problem. The current episode started 2 days ago. The problem occurs constantly. The problem has not changed since onset.The symptoms occur spontaneously and after urination. She is pregnant. She has missed her period. Her LMP was months ago. The patient's menstrual history has been regular. The discharge was normal. Pertinent negatives include no nausea, no vomiting and no dizziness. She has tried nothing for the symptoms. The treatment provided no relief. There is no concern regarding sexually transmitted diseases.    Past Medical History:  Diagnosis Date  . Bipolar 1 disorder (HCC)   . Chlamydia   . Gonorrhea   . Seizure [facility])     Patient Active Problem List   Diagnosis Date Noted  . Seizure disorder (HCC) 09/10/2015  . Bipolar 1 disorder (HCC) 09/10/2015  . Tobacco use disorder 09/10/2015    Past Surgical History:  Procedure Laterality Date  . NO PAST SURGERIES      OB History    Gravida Para Term Preterm AB Living   2 1 1   1      SAB TAB Ectopic Multiple Live Births   1       1       Home Medications    Prior to Admission medications   Medication Sig Start Date End Date Taking? Authorizing Provider  metroNIDAZOLE (FLAGYL) 500 MG tablet Take 1 tablet (500 mg total) by mouth 2 (two) times daily. One po bid x 7 days  03/04/16   Melene Plan, DO    Family History No family history on file.  Social History Social History  Substance Use Topics  . Smoking status: Current Every Day Smoker    Packs/day: 1.00  . Smokeless tobacco: Never Used  . Alcohol use No     Comment: several drinks of liquor a day     Allergies   Nickel   Review of Systems Review of Systems  Constitutional: Negative for chills and fever.  HENT: Negative for congestion and rhinorrhea.   Eyes: Negative for redness and visual disturbance.  Respiratory: Negative for shortness of breath and wheezing.   Cardiovascular: Negative for chest pain and palpitations.  Gastrointestinal: Negative for nausea and vomiting.  Genitourinary: Positive for vaginal bleeding. Negative for decreased urine volume, dysuria and urgency.  Musculoskeletal: Negative for arthralgias and myalgias.  Skin: Negative for pallor and wound.  Neurological: Negative for dizziness and headaches.     Physical Exam Updated Vital Signs BP 123/79   Pulse 77   Temp 97.9 F (36.6 C) (Oral)   Resp 18   Ht 5\' 4"  (1.626 m)   Wt 110 lb (49.9 kg)   LMP 02/23/2016   SpO2 100%   BMI 18.88 kg/m   Physical Exam  Constitutional: She is oriented to  person, place, and time. She appears well-developed and well-nourished. No distress.  HENT:  Head: Normocephalic and atraumatic.  Eyes: EOM are normal. Pupils are equal, round, and reactive to light.  Neck: Normal range of motion. Neck supple.  Cardiovascular: Normal rate and regular rhythm.  Exam reveals no gallop and no friction rub.   No murmur heard. Pulmonary/Chest: Effort normal. She has no wheezes. She has no rales.  Abdominal: Soft. She exhibits no distension and no mass. There is no tenderness. There is no guarding.  Genitourinary: Right adnexum displays no mass, no tenderness and no fullness. Left adnexum displays no mass, no tenderness and no fullness.  Genitourinary Comments: Mild uterine tenderness    Musculoskeletal: She exhibits no edema or tenderness.  Neurological: She is alert and oriented to person, place, and time.  Skin: Skin is warm and dry. She is not diaphoretic.  Psychiatric: She has a normal mood and affect. Her behavior is normal.  Nursing note and vitals reviewed.    ED Treatments / Results  Labs (all labs ordered are listed, but only abnormal results are displayed) Labs Reviewed  WET PREP, GENITAL - Abnormal; Notable for the following:       Result Value   Clue Cells Wet Prep HPF POC PRESENT (*)    WBC, Wet Prep HPF POC RARE (*)    All other components within normal limits  URINALYSIS, ROUTINE W REFLEX MICROSCOPIC - Abnormal; Notable for the following:    Color, Urine STRAW (*)    Specific Gravity, Urine 1.003 (*)    All other components within normal limits  POC URINE PREG, ED - Abnormal; Notable for the following:    Preg Test, Ur POSITIVE (*)    All other components within normal limits  RPR  HIV ANTIBODY (ROUTINE TESTING)  GC/CHLAMYDIA PROBE AMP ([place]) NOT AT [facility]    EKG  EKG Interpretation None       Radiology No results found. EMERGENCY DEPARTMENT US PREGNANCY "Study: Limited Ultrasound of the Pelvis"  INDICATIONS:Pregnancy(required) and Vaginal bleeding Multiple views of the uterus and pelvic cavity are obtained with a multi-frequency probe.  APPROACH:Transabdominal   PERFORMED BY: Myself  IMAGES ARCHIVED?: Yes  LIMITATIONS: none  PREGNANCY FREE FLUID: None  PREGNANCY UTERUS FINDINGS:Uterus enlarged and Gestational sac noted ADNEXAL FINDINGS:Left ovarion cyst and Right ovary not seen  PREGNANCY FINDINGS: Intrauterine gestational sac noted, Fetal pole present and Fetal heart activity seen  INTERPRETATION: Intrauterine gestational sac noted, Yolk sac noted, Fetal pole present and Fetal heart activity seen  GESTATIONAL AGE, ESTIMATE: 6wk 2 days  Procedures Procedures (including critical care time)  Medications Ordered  in ED Medications - No data to display   Initial Impression / Assessment and Plan / ED Course  I have reviewed the triage vital signs and the nursing notes.  Pertinent labs & imaging results that were available during my care of the patient were reviewed by me and considered in my medical decision making (see chart for details).     21 yo F With a chief complaint of spotting. Going on for the past couple days. Patient recently had pregnancy test at home that said that she is pregnant. She is a G2 P1. Denies significant issues with her prior pregnancy. Did have to have multiple doses Rhogam. I discussed possible Rhogam at this time. She elects to follow-up with her OB/GYN.   12:36 PM:  I have discussed the diagnosis/risks/treatment options with the patient and believe the pt to be eligible  for discharge home to follow-up with OB. We also discussed returning to the ED immediately if new or worsening sx occur. We discussed the sx which are most concerning (e.g., sudden worsening pain, fever, inability to tolerate by mouth) that necessitate immediate return. Medications administered to the patient during their visit and any new prescriptions provided to the patient are listed below.  Medications given during this visit Medications - No data to display   The patient appears reasonably screen and/or stabilized for discharge and I doubt any other medical condition or other [facility] requiring further screening, evaluation, or treatment in the ED at this time prior to discharge.   Final Clinical Impressions(s) / ED Diagnoses   Final diagnoses:  Spotting affecting pregnancy in first trimester  BV (bacterial vaginosis)    New Prescriptions New Prescriptions   METRONIDAZOLE (FLAGYL) 500 MG TABLET    Take 1 tablet (500 mg total) by mouth 2 (two) times daily. One po bid x 7 days     Melene Plan, DO 03/04/16 1236

## 2016-03-04 NOTE — Progress Notes (Addendum)
CM spoke with pt who [place]cp.  CM discussed and provided written information to assist pt with determining choice for uninsured accepting pcps, discussed the importance of pcp vs EDP services for f/u care, www.needymeds.org, www.goodrx.com, discounted pharmacies and other [place] resources such as [facility] , [facility], affordable care act, financial assistance, uninsured dental services, [place] med assist, DSS and  health department  Reviewed resources for [place] uninsured accepting pcps like [facility], family medicine at [address], [facility], [facility], local urgent care centers, [facility], [facility] family practice, general medical clinics, family services of the [place], [facility] urgent care plus others, medication resources, [facility] out patient pharmacies and housing Pt voiced understanding and appreciation of resources provided   Provided [facility] contact information  Pt [facility]  Entered in d/c instructions  Please use the list of uninsured [place] providers provided to you by The ED case manager     to find a provider for follow up care to include [facility] [address]  [place], [place] [address]  Main: [phone]   Next Steps: Go on 03/05/2016

## 2016-03-04 NOTE — ED Notes (Signed)
MD was performing procedure.  Will get labs later.

## 2016-03-04 NOTE — ED Triage Notes (Signed)
Patient c/o spotting and states she is pregnant. Patient states she had a positive pregnancy test a few days ago.

## 2016-03-04 NOTE — ED Notes (Signed)
Pt reports vaginal bleeding last night, pt reports she is pregnant but unsure of how far along. Pt reports the bleeding is light pink in nature.

## 2016-03-04 NOTE — ED Notes (Signed)
PT DISCHARGED. INSTRUCTIONS AND PRESCRIPTION GIVEN. AAOX4. PT IN NO APPARENT DISTRESS OR PAIN. THE OPPORTUNITY TO ASK QUESTIONS WAS PROVIDED. 

## 2016-03-05 LAB — GC/CHLAMYDIA PROBE AMP (~~LOC~~) NOT AT ARMC
CHLAMYDIA, DNA PROBE: NEGATIVE
NEISSERIA GONORRHEA: NEGATIVE

## 2016-03-05 LAB — RPR: RPR: NONREACTIVE

## 2016-03-05 LAB — HIV ANTIBODY (ROUTINE TESTING W REFLEX): HIV SCREEN 4TH GENERATION: NONREACTIVE

## 2016-03-19 ENCOUNTER — Encounter (HOSPITAL_COMMUNITY): Payer: Self-pay | Admitting: *Deleted

## 2016-03-19 ENCOUNTER — Inpatient Hospital Stay (HOSPITAL_COMMUNITY)
Admission: AD | Admit: 2016-03-19 | Discharge: 2016-03-19 | Disposition: A | Discharge status: Home / Self Care | Payer: Self-pay | Source: Ambulatory Visit | Attending: Obstetrics & Gynecology | Admitting: Obstetrics & Gynecology

## 2016-03-19 ENCOUNTER — Inpatient Hospital Stay (HOSPITAL_COMMUNITY): Payer: Self-pay

## 2016-03-19 DIAGNOSIS — Z87891 Personal history of nicotine dependence: Secondary | ICD-10-CM | POA: Insufficient documentation

## 2016-03-19 DIAGNOSIS — R112 Nausea with vomiting, unspecified: Secondary | ICD-10-CM

## 2016-03-19 DIAGNOSIS — G40909 Epilepsy, unspecified, not intractable, without status epilepticus: Secondary | ICD-10-CM | POA: Insufficient documentation

## 2016-03-19 DIAGNOSIS — R1030 Lower abdominal pain, unspecified: Secondary | ICD-10-CM

## 2016-03-19 DIAGNOSIS — O99351 Diseases of the nervous system complicating pregnancy, first trimester: Secondary | ICD-10-CM | POA: Insufficient documentation

## 2016-03-19 DIAGNOSIS — Z3A08 8 weeks gestation of pregnancy: Secondary | ICD-10-CM | POA: Insufficient documentation

## 2016-03-19 DIAGNOSIS — R109 Unspecified abdominal pain: Secondary | ICD-10-CM | POA: Insufficient documentation

## 2016-03-19 DIAGNOSIS — G40411 Other generalized epilepsy and epileptic syndromes, intractable, with status epilepticus: Secondary | ICD-10-CM

## 2016-03-19 DIAGNOSIS — O26899 Other specified pregnancy related conditions, unspecified trimester: Secondary | ICD-10-CM

## 2016-03-19 HISTORY — DX: Anxiety disorder, unspecified: F41.9

## 2016-03-19 HISTORY — DX: Epilepsy, unspecified, not intractable, without status epilepticus: G40.909

## 2016-03-19 LAB — WET PREP, GENITAL
CLUE CELLS WET PREP: NONE SEEN
Sperm: NONE SEEN
TRICH WET PREP: NONE SEEN
YEAST WET PREP: NONE SEEN

## 2016-03-19 LAB — URINALYSIS, ROUTINE W REFLEX MICROSCOPIC
BILIRUBIN URINE: NEGATIVE
Glucose, UA: NEGATIVE mg/dL
Hgb urine dipstick: NEGATIVE
KETONES UR: 20 mg/dL — AB
LEUKOCYTES UA: NEGATIVE
NITRITE: NEGATIVE
PH: 6 (ref 5.0–8.0)
PROTEIN: NEGATIVE mg/dL
Specific Gravity, Urine: 1.018 (ref 1.005–1.030)

## 2016-03-19 NOTE — MAU Provider Note (Signed)
History     CSN: [id number]  Arrival date and time: 03/19/16 1759   First Provider Initiated Contact with Patient 03/19/16 1944      Chief Complaint  Patient presents with  . Seizures   Patient is a 21 year old G3 P1 at 8 weeks and 0 days who presents following a seizure. She has known epilepsy and due to insurance reasons has not been on medication since August. She usually takes Keppra. She reports this is the first seizure she's had since going off the medicine which is unusual for her. Last time she was pregnant despite being on medicine she was having 3-4 seizures a week. She has grand mal seizures. The seizure today having all she was outside alone letting her dog out. Her neighbors hurt her and came running. She is not sure if she had her abdomen but thinks she fell backwards. She said the seizure seemed only lasts a few minutes and she felt disoriented week when she woke up. He is now having abdominal cramping. Cramping was present before the seizure but it seems to have worsened. She [facility] but has not had any bleeding since then. She is sexually active. She reports some minimal vaginal discharge. She is very upset and concerned could she is not sure how to enter into [place] how to apply for insurance or how to proceed with the pregnancy. She feels alone and helpless. She is originally from [place]. Currently she is living on her friends grandmothers couch. She has a daughter who is living with her. He does report significant nausea throughout her pregnancy. She reports is occasionally pink tinged. She reports she throws up 3-4 times a day. She has continued to gain weight this pregnancy however. He had a similar problem with her daughter's pregnancy and took omeprazole which helped a lot.     OB History    Gravida Para Term Preterm AB Living   3 1 1   1 1    SAB TAB Ectopic Multiple Live Births   1       1       Past Medical History:  Diagnosis Date  . Anxiety   . Bipolar 1 disorder (HCC)   . Chlamydia   . Epilepsy (HCC)   . Gonorrhea   . Seizure [facility](HCC)     Past Surgical History:  Procedure Laterality Date  . NO PAST SURGERIES      Family History  Problem Relation Age of Onset  . Diabetes Father   . Cancer Maternal Grandmother   . Cancer Paternal Grandmother   . Diabetes Paternal Grandfather     Social History  Substance Use Topics  . Smoking status: Former Smoker    Packs/day: 1.00    Quit date: 02/28/2016  . Smokeless tobacco: Never Used  . Alcohol use No    Allergies:  Allergies  Allergen Reactions  . Nickel Rash    Prescriptions Prior to Admission  Medication Sig Dispense Refill Last Dose  . Prenatal Vit-Fe Fumarate-FA (PRENATAL MULTIVITAMIN) TABS tablet Take 1 tablet by mouth daily at 12 noon.   03/18/2016 at Unknown time  . metroNIDAZOLE (FLAGYL) 500 MG tablet Take 1 tablet (500 mg total) by mouth 2 (two) times daily. One po bid x 7 days (Patient not taking: Reported on 03/19/2016) 14 tablet 0 More than a month at Unknown time    Review of Systems  Constitutional: Negative for  chills and fever.  HENT: Negative for congestion and rhinorrhea.   Respiratory: Negative for cough and shortness of breath.   Cardiovascular: Negative for chest pain and palpitations.  Gastrointestinal: Positive for nausea and vomiting. Negative for abdominal distention, abdominal pain, constipation and diarrhea.  Genitourinary: Negative for difficulty urinating, dysuria and frequency.  Neurological: Negative for dizziness and weakness.   Physical Exam   Blood pressure 117/58, pulse 74, temperature 98.4 F (36.9 C), temperature source Oral, resp. rate 18, height 5\' 4"  (1.626 m), weight 116 lb (52.6 kg), last menstrual period 01/23/2016.  Physical Exam  Constitutional: She is oriented to person, place, and time. She appears well-developed and well-nourished.  HENT:  Head:  Normocephalic and atraumatic.  Respiratory: Effort normal and breath sounds normal. No respiratory distress. She has no wheezes.  GI: Soft. Bowel sounds are normal. She exhibits no distension. There is no rebound and no guarding.  Minimal tenderness in the suprapubic area  Genitourinary: Vagina normal and uterus normal.  Neurological: She is alert and oriented to person, place, and time.  Skin: Skin is warm and dry.  Psychiatric:  Tearful, anxious    MAU Course  Procedures  MDM In MA U patient underwent evaluation with following -Urinalysis which was normal aside from 20 ketones -Abdominal and pelvic ultrasound showed a viable intrauterine pregnancy -I offered to call patient in [place] to the pharmacy to help with nausea she declined and stated she would take over-the-counter omeprazole -I did discuss with patient ways of getting Medicaid and [place] and gave her a verification of pregnancy letter. Message was sent to the clinic to get patient an appointment.  Assessment and Plan  #1: Seizure disorder patient has been off of Breath since August. Patient unable to afford medication at this time we'll not restart. Patient is not postictal and has normal no exam at this time 2: Abdominal cramping unclear etiology no signs of infection noted. Ultrasound normal advise supportive care.  Ernestina Penna 03/19/2016, 7:59 PM

## 2016-03-19 NOTE — Discharge Instructions (Signed)
Abdominal Pain During Pregnancy °Belly (abdominal) pain is common during pregnancy. Most of the time, it is not a serious problem. Other times, it can be a sign that something is wrong with the pregnancy. Always tell your doctor if you have belly pain. °Follow these instructions at home: °Monitor your belly pain for any changes. The following actions may help you feel better: °· Do not have sex (intercourse) or put anything in your vagina until you feel better. °· Rest until your pain stops. °· Drink clear fluids if you feel sick to your stomach (nauseous). Do not eat solid food until you feel better. °· Only take medicine as told by your doctor. °· Keep all doctor visits as told. °Get help right away if: °· You are bleeding, leaking fluid, or pieces of tissue come out of your vagina. °· You have more pain or cramping. °· You keep throwing up (vomiting). °· You have pain when you pee (urinate) or have blood in your pee. °· You have a fever. °· You do not feel your baby moving as much. °· You feel very weak or feel like passing out. °· You have trouble breathing, with or without belly pain. °· You have a very bad headache and belly pain. °· You have fluid leaking from your vagina and belly pain. °· You keep having watery poop (diarrhea). °· Your belly pain does not go away after resting, or the pain gets worse. °This information is not intended to replace advice given to you by your health care provider. Make sure you discuss any questions you have with your health care provider. °Document Released: 01/07/2009 Document Revised: 08/28/2015 Document Reviewed: 08/18/2012 °Elsevier Interactive Patient Education © 2017 [employer] ° °

## 2016-03-19 NOTE — MAU Note (Signed)
Pt had a seizure around 4p (has epilepsy)Pt then started throwing (vomited blood) also having cramping.

## 2016-03-20 LAB — GC/CHLAMYDIA PROBE AMP (~~LOC~~) NOT AT ARMC
Chlamydia: NEGATIVE
Neisseria Gonorrhea: NEGATIVE

## 2016-04-02 ENCOUNTER — Encounter: Payer: Self-pay | Admitting: Obstetrics and Gynecology

## 2016-04-09 ENCOUNTER — Encounter: Payer: Self-pay | Admitting: Family Medicine

## 2016-04-13 ENCOUNTER — Encounter: Payer: Self-pay | Admitting: Obstetrics and Gynecology

## 2016-04-13 ENCOUNTER — Ambulatory Visit (INDEPENDENT_AMBULATORY_CARE_PROVIDER_SITE_OTHER): Payer: Self-pay | Admitting: Obstetrics and Gynecology

## 2016-04-13 VITALS — BP 112/66 | HR 79 | Wt 113.7 lb

## 2016-04-13 DIAGNOSIS — Z124 Encounter for screening for malignant neoplasm of cervix: Secondary | ICD-10-CM

## 2016-04-13 DIAGNOSIS — O0991 Supervision of high risk pregnancy, unspecified, first trimester: Secondary | ICD-10-CM | POA: Insufficient documentation

## 2016-04-13 DIAGNOSIS — G40909 Epilepsy, unspecified, not intractable, without status epilepticus: Secondary | ICD-10-CM

## 2016-04-13 DIAGNOSIS — O99351 Diseases of the nervous system complicating pregnancy, first trimester: Secondary | ICD-10-CM

## 2016-04-13 DIAGNOSIS — O0990 Supervision of high risk pregnancy, unspecified, unspecified trimester: Secondary | ICD-10-CM

## 2016-04-13 DIAGNOSIS — Z8759 Personal history of other complications of pregnancy, childbirth and the puerperium: Secondary | ICD-10-CM | POA: Insufficient documentation

## 2016-04-13 DIAGNOSIS — Z23 Encounter for immunization: Secondary | ICD-10-CM

## 2016-04-13 DIAGNOSIS — O219 Vomiting of pregnancy, unspecified: Secondary | ICD-10-CM | POA: Insufficient documentation

## 2016-04-13 LAB — POCT URINALYSIS DIP (DEVICE)
Glucose, UA: NEGATIVE mg/dL
HGB URINE DIPSTICK: NEGATIVE
Ketones, ur: NEGATIVE mg/dL
Leukocytes, UA: NEGATIVE
NITRITE: NEGATIVE
PH: 6 (ref 5.0–8.0)
Protein, ur: 30 mg/dL — AB
Specific Gravity, Urine: >=1.03 (ref 1.005–1.030)
UROBILINOGEN UA: 1 mg/dL (ref 0.0–1.0)

## 2016-04-13 MED ORDER — ONDANSETRON 4 MG PO TBDP: 4.0000 mg | ORAL_TABLET | Freq: Four times a day (QID) | ORAL | 0 refills | Status: DC | PRN

## 2016-04-13 NOTE — Progress Notes (Signed)
Subjective:  Renee Velez is a 21 y.o. G3P1011 at 204w4d being seen today for first OB appt. Medical history as noted. Has been out of seizure medications since last summer due to no insurance. Last seizure was in Feb. She also has problems with N/V. TSVD a year ago without problems except Sz disorder. She is currently monitored for the following issues for this high-risk pregnancy and has Seizure disorder (HCC); Bipolar 1 disorder (HCC); Tobacco use disorder; Supervision of high risk pregnancy, antepartum; History of gestational hypertension; and Nausea and vomiting during pregnancy on her problem list.  Patient reports nausea.  Contractions: Not present. Vag. Bleeding: None.  Movement: Absent. Denies leaking of fluid.   The following portions of the patient's history were reviewed and updated as appropriate: allergies, current medications, past family history, past medical history, past social history, past surgical history and problem list. Problem list updated.  Objective:   Vitals:   04/13/16 1431  BP: 112/66  Pulse: 79  Weight: 113 lb 11.2 oz (51.6 kg)    Fetal Status: Fetal Heart Rate (bpm): 164   Movement: Absent     General:  Alert, oriented and cooperative. Patient is in no acute distress.  Skin: Skin is warm and dry. No rash noted.   Cardiovascular: Normal heart rate noted  Respiratory: Normal respiratory effort, no problems with respiration noted  Abdomen: Soft, gravid, appropriate for gestational age. Pain/Pressure: Absent     Pelvic:  Cervical exam performed        Extremities: Normal range of motion.  Edema: None  Mental Status: Normal mood and affect. Normal behavior. Normal judgment and thought content.   Urinalysis:      Assessment and Plan:  Pregnancy: G3P1011 at 624w4d  1. Supervision of high risk pregnancy, antepartum Prenatal care and labs reviewed with pt.  - Prenatal Profile I - Culture, OB Urine - Cystic fibrosis gene test - Flu Vaccine QUAD 36+ mos IM  (Fluarix, Quad PF) - Cytology - PAP  2. Seizure disorder (HCC)  - Prenatal Profile I - Culture, OB Urine - Cystic fibrosis gene test - Flu Vaccine QUAD 36+ mos IM (Fluarix, Quad PF) - Cytology - PAP - Ambulatory referral to Neurology  3. History of gestational hypertension  - Prenatal Profile I - Culture, OB Urine - Cystic fibrosis gene test - Flu Vaccine QUAD 36+ mos IM (Fluarix, Quad PF) - Cytology - PAP - Protein / creatinine ratio, urine  4. Nausea and vomiting during pregnancy  - ondansetron (ZOFRAN ODT) 4 MG disintegrating tablet; Take 1 tablet (4 mg total) by mouth every 6 (six) hours as needed for nausea.  Dispense: 20 tablet; Refill: 0  Preterm labor symptoms and general obstetric precautions including but not limited to vaginal bleeding, contractions, leaking of fluid and fetal movement were reviewed in detail with the patient. Please refer to After Visit Summary for other counseling recommendations.  Return in about 4 weeks (around 05/11/2016).   Hermina StaggersMichael L Aeon Kessner, MD

## 2016-04-13 NOTE — Patient Instructions (Addendum)
First Trimester of Pregnancy The first trimester of pregnancy is from week 1 until the end of week 13 (months 1 through 3). A week after a sperm fertilizes an egg, the egg will implant on the wall of the uterus. This embryo will begin to develop into a baby. Genes from you and your partner will form the baby. The female genes will determine whether the baby will be a boy or a girl. At 6-8 weeks, the eyes and face will be formed, and the heartbeat can be seen on ultrasound. At the end of 12 weeks, all the baby's organs will be formed. Now that you are pregnant, you will want to do everything you can to have a healthy baby. Two of the most important things are to get good prenatal care and to follow your health care provider's instructions. Prenatal care is all the medical care you receive before the baby's birth. This care will help prevent, find, and treat any problems during the pregnancy and childbirth. Body changes during your first trimester Your body goes through many changes during pregnancy. The changes vary from woman to woman.  You may gain or lose a couple of pounds at first.  You may feel sick to your stomach (nauseous) and you may throw up (vomit). If the vomiting is uncontrollable, call your health care provider.  You may tire easily.  You may develop headaches that can be relieved by medicines. All medicines should be approved by your health care provider.  You may urinate more often. Painful urination may mean you have a bladder infection.  You may develop heartburn as a result of your pregnancy.  You may develop constipation because certain hormones are causing the muscles that push stool through your intestines to slow down.  You may develop hemorrhoids or swollen veins (varicose veins).  Your breasts may begin to grow larger and become tender. Your nipples may stick out more, and the tissue that surrounds them (areola) may become darker.  Your gums may bleed and may be  sensitive to brushing and flossing.  Dark spots or blotches (chloasma, mask of pregnancy) may develop on your face. This will likely fade after the baby is born.  Your menstrual periods will stop.  You may have a loss of appetite.  You may develop cravings for certain kinds of food.  You may have changes in your emotions from day to day, such as being excited to be pregnant or being concerned that something may go wrong with the pregnancy and baby.  You may have more vivid and strange dreams.  You may have changes in your hair. These can include thickening of your hair, rapid growth, and changes in texture. Some women also have hair loss during or after pregnancy, or hair that feels dry or thin. Your hair will most likely return to normal after your baby is born. What to expect at prenatal visits During a routine prenatal visit:  You will be weighed to make sure you and the baby are growing normally.  Your blood pressure will be taken.  Your abdomen will be measured to track your baby's growth.  The fetal heartbeat will be listened to between weeks 10 and 14 of your pregnancy.  Test results from any previous visits will be discussed. Your health care provider may ask you:  How you are feeling.  If you are feeling the baby move.  If you have had any abnormal symptoms, such as leaking fluid, bleeding, severe headaches, or abdominal   cramping.  If you are using any tobacco products, including cigarettes, chewing tobacco, and electronic cigarettes.  If you have any questions. Other tests that may be performed during your first trimester include:  Blood tests to find your blood type and to check for the presence of any previous infections. The tests will also be used to check for low iron levels (anemia) and protein on red blood cells (Rh antibodies). Depending on your risk factors, or if you previously had diabetes during pregnancy, you may have tests to check for high blood sugar  that affects pregnant women (gestational diabetes).  Urine tests to check for infections, diabetes, or protein in the urine.  An ultrasound to confirm the proper growth and development of the baby.  Fetal screens for spinal cord problems (spina bifida) and Down syndrome.  HIV (human immunodeficiency virus) testing. Routine prenatal testing includes screening for HIV, unless you choose not to have this test.  You may need other tests to make sure you and the baby are doing well. Follow these instructions at home: Medicines   Follow your health care provider's instructions regarding medicine use. Specific medicines may be either safe or unsafe to take during pregnancy.  Take a prenatal vitamin that contains at least 600 micrograms (mcg) of folic acid.  If you develop constipation, try taking a stool softener if your health care provider approves. Eating and drinking   Eat a balanced diet that includes fresh fruits and vegetables, whole grains, good sources of protein such as meat, eggs, or tofu, and low-fat dairy. Your health care provider will help you determine the amount of weight gain that is right for you.  Avoid raw meat and uncooked cheese. These carry germs that can cause birth defects in the baby.  Eating four or five small meals rather than three large meals a day may help relieve nausea and vomiting. If you start to feel nauseous, eating a few soda crackers can be helpful. Drinking liquids between meals, instead of during meals, also seems to help ease nausea and vomiting.  Limit foods that are high in fat and processed sugars, such as fried and sweet foods.  To prevent constipation:  Eat foods that are high in fiber, such as fresh fruits and vegetables, whole grains, and beans.  Drink enough fluid to keep your urine clear or pale yellow. Activity   Exercise only as directed by your health care provider. Most women can continue their usual exercise routine during  pregnancy. Try to exercise for 30 minutes at least 5 days a week. Exercising will help you:  Control your weight.  Stay in shape.  Be prepared for labor and delivery.  Experiencing pain or cramping in the lower abdomen or lower back is a good sign that you should stop exercising. Check with your health care provider before continuing with normal exercises.  Try to avoid standing for long periods of time. Move your legs often if you must stand in one place for a long time.  Avoid heavy lifting.  Wear low-heeled shoes and practice good posture.  You may continue to have sex unless your health care provider tells you not to. Relieving pain and discomfort   Wear a good support bra to relieve breast tenderness.  Take warm sitz baths to soothe any pain or discomfort caused by hemorrhoids. Use hemorrhoid cream if your health care provider approves.  Rest with your legs elevated if you have leg cramps or low back pain.  If you develop   varicose veins in your legs, wear support hose. Elevate your feet for 15 minutes, 3-4 times a day. Limit salt in your diet. Prenatal care   Schedule your prenatal visits by the twelfth week of pregnancy. They are usually scheduled monthly at first, then more often in the last 2 months before delivery.  Write down your questions. Take them to your prenatal visits.  Keep all your prenatal visits as told by your health care provider. This is important. Safety   Wear your seat belt at all times when driving.  Make a list of emergency phone numbers, including numbers for family, friends, the hospital, and police and fire departments. General instructions   Ask your health care provider for a referral to a local prenatal education class. Begin classes no later than the beginning of month 6 of your pregnancy.  Ask for help if you have counseling or nutritional needs during pregnancy. Your health care provider can offer advice or refer you to specialists for  help with various needs.  Do not use hot tubs, steam rooms, or saunas.  Do not douche or use tampons or scented sanitary pads.  Do not cross your legs for long periods of time.  Avoid cat litter boxes and soil used by cats. These carry germs that can cause birth defects in the baby and possibly loss of the fetus by miscarriage or stillbirth.  Avoid all smoking, herbs, alcohol, and medicines not prescribed by your health care provider. Chemicals in these products affect the formation and growth of the baby.  Do not use any products that contain nicotine or tobacco, such as cigarettes and e-cigarettes. If you need help quitting, ask your health care provider. You may receive counseling support and other resources to help you quit.  Schedule a dentist appointment. At home, brush your teeth with a soft toothbrush and be gentle when you floss. Contact a health care provider if:  You have dizziness.  You have mild pelvic cramps, pelvic pressure, or nagging pain in the abdominal area.  You have persistent nausea, vomiting, or diarrhea.  You have a bad smelling vaginal discharge.  You have pain when you urinate.  You notice increased swelling in your face, hands, legs, or ankles.  You are exposed to fifth disease or chickenpox.  You are exposed to German measles (rubella) and have never had it. Get help right away if:  You have a fever.  You are leaking fluid from your vagina.  You have spotting or bleeding from your vagina.  You have severe abdominal cramping or pain.  You have rapid weight gain or loss.  You vomit blood or material that looks like coffee grounds.  You develop a severe headache.  You have shortness of breath.  You have any kind of trauma, such as from a fall or a car accident. Summary  The first trimester of pregnancy is from week 1 until the end of week 13 (months 1 through 3).  Your body goes through many changes during pregnancy. The changes vary  from woman to woman.  You will have routine prenatal visits. During those visits, your health care provider will examine you, discuss any test results you may have, and talk with you about how you are feeling. This information is not intended to replace advice given to you by your health care provider. Make sure you discuss any questions you have with your health care provider. Document Released: 01/13/2001 Document Revised: 01/01/2016 Document Reviewed: 01/01/2016 Elsevier Interactive Patient Education    2017 [employer]  Morning Sickness Morning sickness is when you feel sick to your stomach (nauseous) during pregnancy. This nauseous feeling may or may not come with vomiting. It often occurs in the morning but can be a problem any time of day. Morning sickness is most common during the first trimester, but it may continue throughout pregnancy. While morning sickness is unpleasant, it is usually harmless unless you develop severe and continual vomiting (hyperemesis gravidarum). This condition requires more intense treatment. What are the causes? The cause of morning sickness is not completely known but seems to be related to normal hormonal changes that occur in pregnancy. What increases the risk? You are at greater risk if you:  Experienced nausea or vomiting before your pregnancy.  Had morning sickness during a previous pregnancy.  Are pregnant with more than one baby, such as twins. How is this treated? Do not use any medicines (prescription, over-the-counter, or herbal) for morning sickness without first talking to your health care provider. Your health care provider may prescribe or recommend:  Vitamin B6 supplements.  Anti-nausea medicines.  The herbal medicine ginger. Follow these instructions at home:  Only take over-the-counter or prescription medicines as directed by your health care provider.  Taking multivitamins before getting pregnant can prevent or decrease the  severity of morning sickness in most women.  Eat a piece of dry toast or unsalted crackers before getting out of bed in the morning.  Eat five or six small meals a day.  Eat dry and bland foods (rice, baked potato). Foods high in carbohydrates are often helpful.  Do not drink liquids with your meals. Drink liquids between meals.  Avoid greasy, fatty, and spicy foods.  Get someone to cook for you if the smell of any food causes nausea and vomiting.  If you feel nauseous after taking prenatal vitamins, take the vitamins at night or with a snack.  Snack on protein foods (nuts, yogurt, cheese) between meals if you are hungry.  Eat unsweetened gelatins for desserts.  Wearing an acupressure wristband (worn for sea sickness) may be helpful.  Acupuncture may be helpful.  Do not smoke.  Get a humidifier to keep the air in your house free of odors.  Get plenty of fresh air. Contact a health care provider if:  Your home remedies are not working, and you need medicine.  You feel dizzy or lightheaded.  You are losing weight. Get help right away if:  You have persistent and uncontrolled nausea and vomiting.  You pass out (faint). This information is not intended to replace advice given to you by your health care provider. Make sure you discuss any questions you have with your health care provider. Document Released: 03/12/2006 Document Revised: 06/27/2015 Document Reviewed: 07/06/2012 Elsevier Interactive Patient Education  2017 [employer]

## 2016-04-14 ENCOUNTER — Encounter: Payer: Self-pay | Admitting: General Practice

## 2016-04-14 LAB — PROTEIN / CREATININE RATIO, URINE
CREATININE, UR: 465.2 mg/dL
PROTEIN UR: 27.6 mg/dL
Protein/Creat Ratio: 59 mg/g creat (ref 0–200)

## 2016-04-15 LAB — PRENATAL PROFILE I(LABCORP)
ANTIBODY SCREEN: NEGATIVE
BASOS: 1 %
Basophils Absolute: 0.1 10*3/uL (ref 0.0–0.2)
EOS (ABSOLUTE): 0.1 10*3/uL (ref 0.0–0.4)
EOS: 1 %
HEMATOCRIT: 38.1 % (ref 34.0–46.6)
HEMOGLOBIN: 12.5 g/dL (ref 11.1–15.9)
HEP B S AG: NEGATIVE
IMMATURE GRANS (ABS): 0 10*3/uL (ref 0.0–0.1)
Immature Granulocytes: 0 %
LYMPHS ABS: 2.4 10*3/uL (ref 0.7–3.1)
LYMPHS: 22 %
MCH: 29.3 pg (ref 26.6–33.0)
MCHC: 32.8 g/dL (ref 31.5–35.7)
MCV: 89 fL (ref 79–97)
MONOS ABS: 1 10*3/uL — AB (ref 0.1–0.9)
Monocytes: 10 %
Neutrophils Absolute: 7.1 10*3/uL — ABNORMAL HIGH (ref 1.4–7.0)
Neutrophils: 66 %
Platelets: 316 10*3/uL (ref 150–379)
RBC: 4.26 x10E6/uL (ref 3.77–5.28)
RDW: 14.3 % (ref 12.3–15.4)
RH TYPE: NEGATIVE
RPR: NONREACTIVE
Rubella Antibodies, IGG: 2.91 index (ref >0.99)
WBC: 10.6 10*3/uL (ref 3.4–10.8)

## 2016-04-15 LAB — CYTOLOGY - PAP: Adequacy: ABSENT — AB

## 2016-04-16 LAB — CULTURE, OB URINE

## 2016-04-16 LAB — URINE CULTURE, OB REFLEX

## 2016-04-17 ENCOUNTER — Encounter (HOSPITAL_COMMUNITY): Payer: Self-pay | Admitting: Emergency Medicine

## 2016-04-17 ENCOUNTER — Emergency Department (HOSPITAL_COMMUNITY)
Admission: EM | Admit: 2016-04-17 | Discharge: 2016-04-18 | Disposition: A | Discharge status: Home / Self Care | Payer: Self-pay | Attending: Emergency Medicine | Admitting: Emergency Medicine

## 2016-04-17 DIAGNOSIS — O99331 Smoking (tobacco) complicating pregnancy, first trimester: Secondary | ICD-10-CM | POA: Insufficient documentation

## 2016-04-17 DIAGNOSIS — Z3A12 12 weeks gestation of pregnancy: Secondary | ICD-10-CM | POA: Insufficient documentation

## 2016-04-17 DIAGNOSIS — Z79899 Other long term (current) drug therapy: Secondary | ICD-10-CM | POA: Insufficient documentation

## 2016-04-17 DIAGNOSIS — R569 Unspecified convulsions: Secondary | ICD-10-CM | POA: Insufficient documentation

## 2016-04-17 DIAGNOSIS — O131 Gestational [pregnancy-induced] hypertension without significant proteinuria, first trimester: Secondary | ICD-10-CM | POA: Insufficient documentation

## 2016-04-17 DIAGNOSIS — O99351 Diseases of the nervous system complicating pregnancy, first trimester: Secondary | ICD-10-CM | POA: Insufficient documentation

## 2016-04-17 DIAGNOSIS — F17210 Nicotine dependence, cigarettes, uncomplicated: Secondary | ICD-10-CM | POA: Insufficient documentation

## 2016-04-17 LAB — CBC WITH DIFFERENTIAL/PLATELET
BASOS PCT: 0 %
Basophils Absolute: 0 10*3/uL (ref 0.0–0.1)
Eosinophils Absolute: 0.1 10*3/uL (ref 0.0–0.7)
Eosinophils Relative: 1 %
HEMATOCRIT: 32.5 % — AB (ref 36.0–46.0)
Hemoglobin: 11 g/dL — ABNORMAL LOW (ref 12.0–15.0)
LYMPHS ABS: 2.2 10*3/uL (ref 0.7–4.0)
Lymphocytes Relative: 21 %
MCH: 29.5 pg (ref 26.0–34.0)
MCHC: 33.8 g/dL (ref 30.0–36.0)
MCV: 87.1 fL (ref 78.0–100.0)
MONO ABS: 0.9 10*3/uL (ref 0.1–1.0)
MONOS PCT: 8 %
NEUTROS ABS: 7.7 10*3/uL (ref 1.7–7.7)
Neutrophils Relative %: 70 %
Platelets: 238 10*3/uL (ref 150–400)
RBC: 3.73 MIL/uL — ABNORMAL LOW (ref 3.87–5.11)
RDW: 13.9 % (ref 11.5–15.5)
WBC: 10.9 10*3/uL — ABNORMAL HIGH (ref 4.0–10.5)

## 2016-04-17 LAB — HEPATIC FUNCTION PANEL
ALK PHOS: 40 U/L (ref 38–126)
ALT: 10 U/L — ABNORMAL LOW (ref 14–54)
AST: 16 U/L (ref 15–41)
Albumin: 3.7 g/dL (ref 3.5–5.0)
BILIRUBIN DIRECT: 0.1 mg/dL (ref 0.1–0.5)
BILIRUBIN INDIRECT: 0.5 mg/dL (ref 0.3–0.9)
BILIRUBIN TOTAL: 0.6 mg/dL (ref 0.3–1.2)
Total Protein: 6.9 g/dL (ref 6.5–8.1)

## 2016-04-17 LAB — BASIC METABOLIC PANEL
Anion gap: 10 (ref 5–15)
BUN: 5 mg/dL — AB (ref 6–20)
CO2: 22 mmol/L (ref 22–32)
CREATININE: 0.57 mg/dL (ref 0.44–1.00)
Calcium: 9.2 mg/dL (ref 8.9–10.3)
Chloride: 105 mmol/L (ref 101–111)
GFR calc non Af Amer: >60 mL/min (ref >60)
GLUCOSE: 82 mg/dL (ref 65–99)
Potassium: 3.3 mmol/L — ABNORMAL LOW (ref 3.5–5.1)
Sodium: 137 mmol/L (ref 135–145)

## 2016-04-17 MED ORDER — SODIUM CHLORIDE 0.9 % IV SOLN
1000.0000 mg | Freq: Once | INTRAVENOUS | Status: DC
  Filled 2016-04-17: qty 10

## 2016-04-17 NOTE — ED Provider Notes (Signed)
[facility]-EMERGENCY DEPT Provider Note   CSN: [id number] Arrival date & time: 04/17/16  2112     History   Chief Complaint Chief Complaint  Patient presents with  . Seizures    HPI Renee Velez is a 21 y.o. female.  HPI   Patient is a 21 year old female with history of epilepsy, hypertension, anxiety and bipolar disorder who presents to the ED via EMS status post seizure that occurred prior to arrival. Patient reports she was sitting in a chair at home and states she began to feel like she was having a seizure and notes that her next thing she remembered was waking up laying on the ground vomiting. Patient reports having a full tonic-clonic seizure which is consistent with seizure she has had in the past. She also reports having a similar seizure yesterday. Patient notes she is [redacted] weeks pregnant. She states she has not been taking her Keppra since January due to having an old expired prescription and not liking the way she feels while taking the medication. Patient currently reports having mild discomfort to her left mid back and back of her head. Denies any other pain or complaints. She reports she has been having mild intermittent lower abdominal cramping for the past few weeks and notes she was [facility] a few weeks ago for similar symptoms. Denies fever, visual changes, dizziness, cough, shortness of breath, chest pain, abdominal pain, nausea, urinary or bowel incontinence, vaginal bleeding, vaginal discharge, numbness, weakness. Patient denies alcohol or drug use. LMP 01/22/17. G3P1.  Past Medical History:  Diagnosis Date  . Anxiety   . Bipolar 1 disorder (HCC)   . Chlamydia   . Epilepsy (HCC)   . Gonorrhea   . Hypertension   . Seizure [facility](HCC)     Patient Active Problem List   Diagnosis Date Noted  . Supervision of high risk pregnancy, antepartum 04/13/2016  . History of gestational hypertension 04/13/2016  . Nausea and vomiting during pregnancy 04/13/2016  .  Seizure disorder (HCC) 09/10/2015  . Bipolar 1 disorder (HCC) 09/10/2015  . Tobacco use disorder 09/10/2015    Past Surgical History:  Procedure Laterality Date  . NO PAST SURGERIES      OB History    Gravida Para Term Preterm AB Living   4 1 1   1 1    SAB TAB Ectopic Multiple Live Births   1 0 0 0 1       Home Medications    Prior to Admission medications   Medication Sig Start Date End Date Taking? Authorizing Provider  ondansetron (ZOFRAN ODT) 4 MG disintegrating tablet Take 1 tablet (4 mg total) by mouth every 6 (six) hours as needed for nausea. 04/13/16   Hermina StaggersMichael L Ervin, MD  Prenatal Vit-Fe Fumarate-FA (PRENATAL MULTIVITAMIN) TABS tablet Take 1 tablet by mouth daily at 12 noon.    Historical Provider, MD    Family History Family History  Problem Relation Age of Onset  . Diabetes Father   . Cancer Maternal Grandmother   . Cancer Paternal Grandmother   . Diabetes Paternal Grandfather     Social History Social History  Substance Use Topics  . Smoking status: Current Every Day Smoker    Packs/day: 0.25    Types: Cigarettes    Last attempt to quit: 02/28/2016  . Smokeless tobacco: Never Used  . Alcohol use No     Allergies   Nickel   Review of Systems Review of Systems  Gastrointestinal: Positive for abdominal pain (  cramping), nausea and vomiting.  Neurological: Positive for seizures.  All other systems reviewed and are negative.    Physical Exam Updated Vital Signs BP 116/63 (BP Location: Right Arm)   Pulse 72   Temp 98.7 F (37.1 C) (Oral)   Resp 11   LMP 01/23/2016   SpO2 100%   Physical Exam  Constitutional: She is oriented to person, place, and time. She appears well-developed and well-nourished. No distress.  HENT:  Head: Normocephalic and atraumatic. Head is without raccoon's eyes, without Battle's sign, without abrasion, without contusion and without laceration.  Right Ear: Tympanic membrane normal. No hemotympanum.  Left Ear: Tympanic  membrane normal. No hemotympanum.  Nose: Nose normal. No nose lacerations, sinus tenderness, nasal deformity, septal deviation or nasal septal hematoma. No epistaxis.  Mouth/Throat: Uvula is midline, oropharynx is clear and moist and mucous membranes are normal. No oropharyngeal exudate, posterior oropharyngeal edema, posterior oropharyngeal erythema or tonsillar abscesses. No tonsillar exudate.  No bite marks present.  Eyes: Conjunctivae and EOM are normal. Pupils are equal, round, and reactive to light. Right eye exhibits no discharge. Left eye exhibits no discharge. No scleral icterus.  Neck: Normal range of motion. Neck supple.  Cardiovascular: Normal rate, regular rhythm, normal heart sounds and intact distal pulses.   Pulmonary/Chest: Effort normal and breath sounds normal. No respiratory distress. She has no wheezes. She has no rales. She exhibits no tenderness.  Abdominal: Soft. Bowel sounds are normal. She exhibits no distension and no mass. There is no tenderness. There is no rebound and no guarding. No hernia.  Musculoskeletal: Normal range of motion. She exhibits no edema, tenderness or deformity.  No midline C, T, or L tenderness. Full range of motion of neck and back. Full range of motion of bilateral upper and lower extremities, with 5/5 strength. Sensation intact. 2+ radial and PT pulses. Cap refill <2 seconds.   Lymphadenopathy:    She has no cervical adenopathy.  Neurological: She is alert and oriented to person, place, and time. She has normal strength. No cranial nerve deficit or sensory deficit. Coordination normal.  Skin: Skin is warm and dry. Capillary refill takes less than 2 seconds. She is not diaphoretic.  Nursing note and vitals reviewed.    ED Treatments / Results  Labs (all labs ordered are listed, but only abnormal results are displayed) Labs Reviewed  CBC WITH DIFFERENTIAL/PLATELET - Abnormal; Notable for the following:       Result Value   WBC 10.9 (*)     RBC 3.73 (*)    Hemoglobin 11.0 (*)    HCT 32.5 (*)    All other components within normal limits  BASIC METABOLIC PANEL - Abnormal; Notable for the following:    Potassium 3.3 (*)    BUN 5 (*)    All other components within normal limits  HEPATIC FUNCTION PANEL - Abnormal; Notable for the following:    ALT 10 (*)    All other components within normal limits    EKG  EKG Interpretation None       Radiology No results found.  Procedures Procedures (including critical care time)  Medications Ordered in ED Medications  levETIRAcetam (KEPPRA) 1,000 mg in sodium chloride 0.9 % 100 mL IVPB (not administered)     Initial Impression / Assessment and Plan / ED Course  I have reviewed the triage vital signs and the nursing notes.  Pertinent labs & imaging results that were available during my care of the patient were reviewed by  me and considered in my medical decision making (see chart for details).     Pt is a 22 yo female with PMH of epilepsy who is [redacted] weeks pregnant that presents s/p seizure that occurred PTA. Denies taking her seizure med (Keppra) for the past few months due to it expiring and noncompliance. Denies alcohol or drug use. VSS. Exam unremarkable. No neuro deficits. No oral trauma. Abdominal exam benign. Denies abdominal pain.   Chart review shows patient was seen on 04/13/16 for first OB appointment. Patient had prenatal labs ordered, was given Zofran for nausea and referral to neurology. Patient was seen at [facility] into/15/18 for seizure, abdominal cramping, nausea and vomiting. Abdominal/pelvic ultrasound showed viable intrauterine pregnancy and pt was d/c home.   Discussed pt with Dr. Clayborne Dana. Plan to order labs including LFTs and consult neurology regarding seizure management. Dr. Amada Jupiter advised to restart pt on Keppra medications and given 1g loading dose in the ED. Plan to d/c pt home with rx of 750mg  Keppra BID.   11:15- I was notified by nursing  staff that pt ran out of the ED prior to receiving her dose of IV Keppra. I tried to find pt in the lobby prior to leaving but pt had already left the department and was unable to be found by staff.   Final Clinical Impressions(s) / ED Diagnoses   Final diagnoses:  Seizure (HCC)  [redacted] weeks gestation of pregnancy    New Prescriptions New Prescriptions   No medications on file     Barrett Henle, [place] 04/17/16 2328    Marily Memos, MD 04/18/16 0040

## 2016-04-17 NOTE — ED Notes (Signed)
Seizure pads placed on stretcher

## 2016-04-17 NOTE — ED Triage Notes (Signed)
Pt to ED via [facility] with report of seizures x's 4 today.  Pt has hx of seizures but does not take her medications for same.  Pt not incontinent of urine and does not have any mouth trauma present.

## 2016-04-17 NOTE — ED Notes (Signed)
Pt alert and oriented x's 3.  Pt requesting some ice chips

## 2016-04-18 NOTE — ED Notes (Signed)
Pt seen leaving ER   IV saline lock found in room cath. Intact

## 2016-04-22 LAB — CYSTIC FIBROSIS GENE TEST

## 2016-04-23 ENCOUNTER — Telehealth: Payer: Self-pay | Admitting: General Practice

## 2016-04-23 ENCOUNTER — Encounter: Payer: Self-pay | Admitting: Obstetrics and Gynecology

## 2016-04-23 ENCOUNTER — Encounter: Payer: Self-pay | Admitting: *Deleted

## 2016-04-23 DIAGNOSIS — Z141 Cystic fibrosis carrier: Secondary | ICD-10-CM

## 2016-04-23 NOTE — Telephone Encounter (Signed)
Per Dr Alysia PennaErvin, patient needs referral to MFM due to CF test results. Scheduled for 3/27 @ 2pm. Called patient, no answer- left message to call us back regarding results & an appt. Will send letter

## 2016-04-28 ENCOUNTER — Ambulatory Visit (HOSPITAL_COMMUNITY): Payer: MEDICAID

## 2016-05-11 ENCOUNTER — Encounter: Payer: Self-pay | Admitting: Obstetrics and Gynecology

## 2016-05-20 ENCOUNTER — Encounter: Payer: Self-pay | Admitting: Obstetrics and Gynecology

## 2016-09-24 ENCOUNTER — Telehealth: Payer: Self-pay | Admitting: Obstetrics & Gynecology

## 2016-09-24 NOTE — Telephone Encounter (Signed)
Called patient about missed appointments. Had to leave a voice mail for her to call us.

## 2017-01-21 ENCOUNTER — Encounter (HOSPITAL_COMMUNITY): Payer: Self-pay

## 2018-02-02 NOTE — L&D Delivery Note (Addendum)
OB/[facility]N Faculty Practice Delivery Note  Renee Velez is a 23 y.o. [id number] s/p Vaginal Delivery at [redacted]w[redacted]d. She was admitted for IOL 2/2 FGR.   ROM: Delivered en caul, clear fluid GBS Status: Negative Maximum Maternal Temperature: 98.2  Labor Progress: Labor augmented with Cytotec, Foley Bulb and Pitocin.  Progressed to vaginal en caul delivery.  Delivery Date/Time: 01/21/2019 at 1231 Delivery: Called to room and patient was complete and pushing. Head in ROA, shoulder and body delivered in en caul. No nuchal cord present.  Infant with spontaneous cry, placed on mother's abdomen, dried and stimulated. Cord clamped x 2 after 1-minute delay, and cut by FOB. Cord blood drawn. Placenta delivered spontaneously with gentle cord traction. Fundus firm with massage and Pitocin. Labia, perineum, vagina, and cervix inspected with no lacerations visualized.   Placenta: 3 vessel cord, intact and sent to Pathology due to FGR  Complications: None Lacerations: None QBL: 150mL Analgesia: None  Postpartum Planning [x]  message to sent to schedule follow-up   [x]  vaccines UTD  Infant: female  APGARs 8/9  [place] for [place] 01/21/2019, 1:01 PM  OB FELLOW DELIVERY ATTESTATION  I was gloved and present for the delivery in its entirety, and I agree with the above resident's note.    Phill Myron, D.O. OB Fellow  01/21/2019, 2:41 PM

## 2018-06-26 ENCOUNTER — Emergency Department (HOSPITAL_COMMUNITY)
Admission: EM | Admit: 2018-06-26 | Discharge: 2018-06-26 | Disposition: A | Discharge status: Home / Self Care | Payer: Medicaid - Out of State | Attending: Emergency Medicine | Admitting: Emergency Medicine

## 2018-06-26 ENCOUNTER — Other Ambulatory Visit: Payer: Self-pay

## 2018-06-26 ENCOUNTER — Emergency Department (HOSPITAL_COMMUNITY): Payer: Medicaid - Out of State

## 2018-06-26 ENCOUNTER — Encounter (HOSPITAL_COMMUNITY): Payer: Self-pay | Admitting: Emergency Medicine

## 2018-06-26 DIAGNOSIS — R110 Nausea: Secondary | ICD-10-CM | POA: Insufficient documentation

## 2018-06-26 DIAGNOSIS — Z3A08 8 weeks gestation of pregnancy: Secondary | ICD-10-CM | POA: Diagnosis not present

## 2018-06-26 DIAGNOSIS — R531 Weakness: Secondary | ICD-10-CM | POA: Insufficient documentation

## 2018-06-26 DIAGNOSIS — I10 Essential (primary) hypertension: Secondary | ICD-10-CM | POA: Diagnosis not present

## 2018-06-26 DIAGNOSIS — R51 Headache: Secondary | ICD-10-CM | POA: Diagnosis not present

## 2018-06-26 DIAGNOSIS — O209 Hemorrhage in early pregnancy, unspecified: Secondary | ICD-10-CM | POA: Diagnosis not present

## 2018-06-26 DIAGNOSIS — R1084 Generalized abdominal pain: Secondary | ICD-10-CM | POA: Diagnosis not present

## 2018-06-26 DIAGNOSIS — R569 Unspecified convulsions: Secondary | ICD-10-CM | POA: Diagnosis present

## 2018-06-26 DIAGNOSIS — F17210 Nicotine dependence, cigarettes, uncomplicated: Secondary | ICD-10-CM | POA: Insufficient documentation

## 2018-06-26 DIAGNOSIS — O99331 Smoking (tobacco) complicating pregnancy, first trimester: Secondary | ICD-10-CM | POA: Insufficient documentation

## 2018-06-26 DIAGNOSIS — G40909 Epilepsy, unspecified, not intractable, without status epilepticus: Secondary | ICD-10-CM

## 2018-06-26 DIAGNOSIS — Z3201 Encounter for pregnancy test, result positive: Secondary | ICD-10-CM | POA: Insufficient documentation

## 2018-06-26 LAB — CBC WITH DIFFERENTIAL/PLATELET
Abs Immature Granulocytes: 0.04 10*3/uL (ref 0.00–0.07)
Basophils Absolute: 0.1 10*3/uL (ref 0.0–0.1)
Basophils Relative: 1 %
Eosinophils Absolute: 0.1 10*3/uL (ref 0.0–0.5)
Eosinophils Relative: 1 %
HCT: 38 % (ref 36.0–46.0)
Hemoglobin: 12.7 g/dL (ref 12.0–15.0)
Immature Granulocytes: 0 %
Lymphocytes Relative: 14 %
Lymphs Abs: 1.5 10*3/uL (ref 0.7–4.0)
MCH: 28.7 pg (ref 26.0–34.0)
MCHC: 33.4 g/dL (ref 30.0–36.0)
MCV: 86 fL (ref 80.0–100.0)
Monocytes Absolute: 1 10*3/uL (ref 0.1–1.0)
Monocytes Relative: 9 %
Neutro Abs: 8 10*3/uL — ABNORMAL HIGH (ref 1.7–7.7)
Neutrophils Relative %: 75 %
Platelets: 293 10*3/uL (ref 150–400)
RBC: 4.42 MIL/uL (ref 3.87–5.11)
RDW: 13.2 % (ref 11.5–15.5)
WBC: 10.7 10*3/uL — ABNORMAL HIGH (ref 4.0–10.5)
nRBC: 0 % (ref 0.0–0.2)

## 2018-06-26 LAB — HCG, QUANTITATIVE, PREGNANCY: hCG, Beta Chain, Quant, S: 55571 m[IU]/mL — ABNORMAL HIGH (ref <5)

## 2018-06-26 LAB — URINALYSIS, ROUTINE W REFLEX MICROSCOPIC
Bilirubin Urine: NEGATIVE
Glucose, UA: NEGATIVE mg/dL
Hgb urine dipstick: NEGATIVE
Ketones, ur: 20 mg/dL — AB
Leukocytes,Ua: NEGATIVE
Nitrite: NEGATIVE
Protein, ur: NEGATIVE mg/dL
Specific Gravity, Urine: 1.004 — ABNORMAL LOW (ref 1.005–1.030)
pH: 8 (ref 5.0–8.0)

## 2018-06-26 LAB — PREGNANCY, URINE: Preg Test, Ur: POSITIVE — AB

## 2018-06-26 LAB — BASIC METABOLIC PANEL
Anion gap: 9 (ref 5–15)
BUN: <5 mg/dL — ABNORMAL LOW (ref 6–20)
CO2: 22 mmol/L (ref 22–32)
Calcium: 9.7 mg/dL (ref 8.9–10.3)
Chloride: 106 mmol/L (ref 98–111)
Creatinine, Ser: 0.79 mg/dL (ref 0.44–1.00)
GFR calc Af Amer: >60 mL/min (ref >60)
GFR calc non Af Amer: >60 mL/min (ref >60)
Glucose, Bld: 101 mg/dL — ABNORMAL HIGH (ref 70–99)
Potassium: 4.6 mmol/L (ref 3.5–5.1)
Sodium: 137 mmol/L (ref 135–145)

## 2018-06-26 LAB — HEPATIC FUNCTION PANEL
ALT: 11 U/L (ref 0–44)
AST: 16 U/L (ref 15–41)
Albumin: 3.9 g/dL (ref 3.5–5.0)
Alkaline Phosphatase: 50 U/L (ref 38–126)
Bilirubin, Direct: <0.1 mg/dL (ref 0.0–0.2)
Total Bilirubin: 0.8 mg/dL (ref 0.3–1.2)
Total Protein: 7.3 g/dL (ref 6.5–8.1)

## 2018-06-26 LAB — ABO/RH: ABO/RH(D): A NEG

## 2018-06-26 MED ORDER — ONDANSETRON 4 MG PO TBDP
8.0000 mg | ORAL_TABLET | Freq: Once | ORAL | Status: AC
  Administered 2018-06-26: 8 mg via ORAL
  Filled 2018-06-26: qty 2

## 2018-06-26 MED ORDER — LEVETIRACETAM 1000 MG PO TABS: 1000.0000 mg | ORAL_TABLET | Freq: Two times a day (BID) | ORAL | 0 refills | Status: DC

## 2018-06-26 MED ORDER — RHO D IMMUNE GLOBULIN 1500 UNIT/2ML IJ SOSY
300.0000 ug | PREFILLED_SYRINGE | Freq: Once | INTRAMUSCULAR | Status: AC
  Administered 2018-06-26: 300 ug via INTRAVENOUS

## 2018-06-26 MED ORDER — LEVETIRACETAM IN NACL 1500 MG/100ML IV SOLN
1500.0000 mg | Freq: Once | INTRAVENOUS | Status: AC
  Administered 2018-06-26: 1500 mg via INTRAVENOUS
  Filled 2018-06-26: qty 100

## 2018-06-26 MED ORDER — DOXYLAMINE-PYRIDOXINE 10-10 MG PO TBEC: 1.0000 | DELAYED_RELEASE_TABLET | Freq: Three times a day (TID) | ORAL | 0 refills | Status: DC | PRN

## 2018-06-26 NOTE — ED Triage Notes (Addendum)
Patient reports "I keep having seizures," patient reports over the past 3 days she has had about 4 seizures, hx of epilepsy-not currently on medication. Last seizure was this am, reports hitting her head. Pt states she also passed a large blood clot last night and her period is also late- has not taken a test. Reports feeling "hot and cold." pt a/ox4, resp e/u, nad.

## 2018-06-26 NOTE — ED Notes (Signed)
Pt returns from [facility]. 

## 2018-06-26 NOTE — ED Provider Notes (Signed)
[facility] EMERGENCY DEPARTMENT Provider Note   CSN: [id number] Arrival date & time: 06/26/18  2130    History   Chief Complaint Chief Complaint  Patient presents with   Seizures    HPI Renee Velez is a 23 y.o. female with a known history of seizures presenting to the ED after reportedly having 4 seizures in the past 3 days. She recently moved from [place] and states she was on Keppra and Lamictal during her most recent pregnancy ~5 months ago. She stopped taking these meds about 3 weeks ago. She reports her last seizure was this morning, when she also hit her head. She also passed a blood clot last night prior to having a seizure and states her period is late. She is currently not on any medications, states she does not liking taking any meds. She endorses nausea, headache, abdominal cramping and generalized weakness. Denies any lightheadedness, dizziness, visual changes, recent illnesses or sick contacts. She states she does not drive and tries to avoid other high risk activities. She does not have a local neurologist she follows with.    HPI  Past Medical History:  Diagnosis Date   Anxiety    Bipolar 1 disorder (HCC)    Chlamydia    Epilepsy (HCC)    Gonorrhea    Hypertension    Seizure [facility])     Patient Active Problem List   Diagnosis Date Noted   Supervision of high risk pregnancy, antepartum 04/13/2016   History of gestational hypertension 04/13/2016   Nausea and vomiting during pregnancy 04/13/2016   Seizure disorder (HCC) 09/10/2015   Bipolar 1 disorder (HCC) 09/10/2015   Tobacco use disorder 09/10/2015    Past Surgical History:  Procedure Laterality Date   NO PAST SURGERIES       OB History    Gravida  3   Para  1   Term  1   Preterm      AB  1   Living  1     SAB  1   TAB  0   Ectopic  0   Multiple  0   Live Births  1            Home Medications    Prior to Admission medications   Medication Sig  Start Date End Date Taking? Authorizing Provider  Doxylamine-Pyridoxine 10-10 MG TBEC Take 1 tablet by mouth every 8 (eight) hours as needed (nausea). 06/26/18   Long, Arlyss Repress, MD  levETIRAcetam (KEPPRA) 1000 MG tablet Take 1 tablet (1,000 mg total) by mouth 2 (two) times daily for 30 days. 06/27/18 07/27/18  Eller Sweis Dorris Carnes, DO    Family History Family History  Problem Relation Age of Onset   Diabetes Father    Cancer Maternal Grandmother    Cancer Paternal Grandmother    Diabetes Paternal Grandfather     Social History Social History   Tobacco Use   Smoking status: Current Every Day Smoker    Packs/day: 0.25    Types: Cigarettes    Last attempt to quit: 02/28/2016    Years since quitting: 2.3   Smokeless tobacco: Never Used  Substance Use Topics   Alcohol use: No   Drug use: No     Allergies   Nickel   Review of Systems Review of Systems  Constitutional: Negative for chills, diaphoresis, fatigue and fever.  HENT: Negative for congestion, rhinorrhea and sore throat.   Eyes: Negative for pain and visual disturbance.  Respiratory:  Negative for cough and shortness of breath.   Cardiovascular: Negative for chest pain and palpitations.  Gastrointestinal: Positive for abdominal pain and nausea. Negative for constipation, diarrhea and vomiting.  Genitourinary: Positive for frequency and vaginal bleeding. Negative for difficulty urinating, dysuria, hematuria, urgency, vaginal discharge and vaginal pain.  Neurological: Positive for seizures, weakness and headaches. Negative for dizziness and light-headedness.  Psychiatric/Behavioral: Negative for confusion.     Physical Exam Updated Vital Signs BP (!) 110/58    Pulse (!) 57    Temp (!) 97.3 F (36.3 C) (Oral)    Resp 13    Ht  (1.626 m)    Wt 72.6 kg    LMP 05/01/2018 (Approximate)    SpO2 96%    BMI 27.46 kg/m   Physical Exam Constitutional:      Appearance: Normal appearance. She is normal weight. She is  not diaphoretic.  HENT:     Head: Normocephalic and atraumatic.     Mouth/Throat:     Mouth: Mucous membranes are dry.     Pharynx: Oropharynx is clear. No oropharyngeal exudate or posterior oropharyngeal erythema.  Eyes:     Extraocular Movements: Extraocular movements intact.     Conjunctiva/sclera: Conjunctivae normal.     Pupils: Pupils are equal, round, and reactive to light.  Cardiovascular:     Rate and Rhythm: Normal rate and regular rhythm.     Heart sounds: Normal heart sounds. No murmur. No friction rub. No gallop.   Pulmonary:     Effort: Pulmonary effort is normal. No respiratory distress.     Breath sounds: Normal breath sounds. No wheezing or rales.  Abdominal:     General: Abdomen is flat. Bowel sounds are normal. There is no distension.     Palpations: Abdomen is soft.     Tenderness: There is abdominal tenderness.  Musculoskeletal:     Right lower leg: No edema.     Left lower leg: No edema.  Skin:    General: Skin is warm and dry.     Comments: Left LE bruise   Neurological:     Mental Status: She is alert and oriented to person, place, and time.  Psychiatric:        Mood and Affect: Mood normal.        Behavior: Behavior normal.        Thought Content: Thought content normal.        Judgment: Judgment normal.      ED Treatments / Results  Labs (all labs ordered are listed, but only abnormal results are displayed) Labs Reviewed  CBC WITH DIFFERENTIAL/PLATELET - Abnormal; Notable for the following components:      Result Value   WBC 10.7 (*)    Neutro Abs 8.0 (*)    All other components within normal limits  BASIC METABOLIC PANEL - Abnormal; Notable for the following components:   Glucose, Bld 101 (*)    BUN <5 (*)    All other components within normal limits  URINALYSIS, ROUTINE W REFLEX MICROSCOPIC - Abnormal; Notable for the following components:   Color, Urine STRAW (*)    Specific Gravity, Urine 1.004 (*)    Ketones, ur 20 (*)    All other  components within normal limits  PREGNANCY, URINE - Abnormal; Notable for the following components:   Preg Test, Ur POSITIVE (*)    All other components within normal limits  HCG, QUANTITATIVE, PREGNANCY - Abnormal; Notable for the following components:   hCG,  Beta ChainSharene Butters, Quant, [place] [phone] (*)    All other components within normal limits  HEPATIC FUNCTION PANEL  ABO/RH    EKG EKG Interpretation  Date/Time:  Sunday Jun 26 2018 11:08:24 EDT Ventricular Rate:  68 PR Interval:    QRS Duration: 91 QT Interval:  393 QTC Calculation: 418 R Axis:   95 Text Interpretation:  Sinus rhythm Borderline right axis deviation No STEMI  Confirmed by Alona BeneLong, [place] [phone]([id number]) on 06/26/2018 11:19:45 AM   Radiology [place] Ob Comp < 14 Wks  Result Date: 06/26/2018 CLINICAL DATA:  Lower abdominal/pelvic pain EXAM: OBSTETRIC <14 WK US AND TRANSVAGINAL OB US TECHNIQUE: Both transabdominal and transvaginal ultrasound examinations were performed for complete evaluation of the gestation as well as the maternal uterus, adnexal regions, and pelvic cul-de-sac. Transvaginal technique was performed to assess early pregnancy. COMPARISON:  None. FINDINGS: Intrauterine gestational sac: Visualized Yolk sac:  Visualized Embryo:  Visualized Cardiac Activity: Visualized Heart Rate: 117 bpm CRL:  11 mm   7 w   2 d                  US EDC: February 10, 2019 Subchorionic hemorrhage: There is an 8 x 4 mm subchorionic hemorrhage. Maternal uterus/adnexae: Cervical os is closed. Right ovary measures 4.0 x 2.7 x 2.9 cm. Left ovary measures 3.4 x 1.6 x 2.0 cm. There is a corpus luteum on the right measuring 2.7 x 1.9 x 2.3 cm. No free pelvic fluid. IMPRESSION: Single live intrauterine gestation with estimated gestational age of 7+ weeks. 8 x 4 mm subchorionic hemorrhage noted. Corpus luteum arising from right ovary. No other extrauterine pelvic mass. No free pelvic fluid. Electronically Signed   By: Bretta BangWilliam  Woodruff III M.D.   On: 06/26/2018 13:38    [place] Ob Transvaginal  Result Date: 06/26/2018 CLINICAL DATA:  Lower abdominal/pelvic pain EXAM: OBSTETRIC <14 WK US AND TRANSVAGINAL OB US TECHNIQUE: Both transabdominal and transvaginal ultrasound examinations were performed for complete evaluation of the gestation as well as the maternal uterus, adnexal regions, and pelvic cul-de-sac. Transvaginal technique was performed to assess early pregnancy. COMPARISON:  None. FINDINGS: Intrauterine gestational sac: Visualized Yolk sac:  Visualized Embryo:  Visualized Cardiac Activity: Visualized Heart Rate: 117 bpm CRL:  11 mm   7 w   2 d                  US EDC: February 10, 2019 Subchorionic hemorrhage: There is an 8 x 4 mm subchorionic hemorrhage. Maternal uterus/adnexae: Cervical os is closed. Right ovary measures 4.0 x 2.7 x 2.9 cm. Left ovary measures 3.4 x 1.6 x 2.0 cm. There is a corpus luteum on the right measuring 2.7 x 1.9 x 2.3 cm. No free pelvic fluid. IMPRESSION: Single live intrauterine gestation with estimated gestational age of 7+ weeks. 8 x 4 mm subchorionic hemorrhage noted. Corpus luteum arising from right ovary. No other extrauterine pelvic mass. No free pelvic fluid. Electronically Signed   By: Bretta BangWilliam  Woodruff III M.D.   On: 06/26/2018 13:38    Procedures Procedures (including critical care time)  Medications Ordered in ED Medications  levETIRAcetam (KEPPRA) IVPB 1500 mg/ 100 mL premix (has no administration in time range)  rho (d) immune globulin (RHIG/RHOPHYLAC) injection 300 mcg (has no administration in time range)  ondansetron (ZOFRAN-ODT) disintegrating tablet 8 mg (8 mg Oral Given 06/26/18 1058)     Initial Impression / Assessment and Plan / ED Course  I have reviewed the triage vital signs and  the nursing notes.  Pertinent labs & imaging results that were available during my care of the patient were reviewed by me and considered in my medical decision making (see chart for details).  Pt is a 23 yo female with a known  history of epilepsy not on medications presenting to the ED after 4 seizures in the passed four days. She is not on any medications, in the past has been on keppra 1000 mg bid and Lamictal tid. Her seizure activity is likely due to non-compliance with anti-seizure medications. She also reports passing a blood clot last night and is having lower abdominal cramping- concerning for miscarriage. LMP was 3/26. She also reports she is Rh negative. Will check a pregnancy test along with other labs and ekg to rule out other causes of seizure activity.   Pregnancy test is positive. Will add a quantitative hCG and pelvic ultrasound.   Discussed patient with neurology, Dr. Wilford Corner, who recommended loading with IV keppra 1500 mg and continuing keppra 100 mg BID starting tomorrow, with close neurology follow up in 2 weeks. Discussed seizure precautions and to avoid driving, swimming, operating machinery, etc. Patient expressed understanding.  Quantitative hCG 55,571. Pelvic and transvaginal ultrasound shows single live intrauterine gestating with estimated gestational age of 7+ weeks. Subchorionic hemorrhage noted. Patient is Rh negative, will administer Rhogan. Patient is stable for discharge home to follow up with neurology in 2 weeks and call to establish with local Ob.    Final Clinical Impressions(s) / ED Diagnoses   Final diagnoses:  Seizure disorder (HCC)  [redacted] weeks gestation of pregnancy    ED Discharge Orders         Ordered    levETIRAcetam (KEPPRA) 1000 MG tablet  2 times daily     06/26/18 1250    Ambulatory referral to Neurology    Comments:  An appointment is requested in approximately: 2 weeks. Known history of seizures, not taking medications and re-started on keppra 1000 mg.  PATIENT IS PREGNANT.   06/26/18 1250    Doxylamine-Pyridoxine 10-10 MG TBEC  Every 8 hours PRN     06/26/18 1354           Teagan Heidrick N, DO 06/26/18 1355    Long, Arlyss Repress, MD 06/27/18 [phone]

## 2018-06-26 NOTE — ED Notes (Signed)
Patient transported to Ultrasound 

## 2018-06-26 NOTE — ED Notes (Addendum)
ED Provider (Resident) at bedside. 

## 2018-06-26 NOTE — Discharge Instructions (Addendum)
You presented to the ED due to seizure activity and lower abdominal pain. You were loaded with Keppra and to start Keppra 1000 mg twice a day starting tomorrow morning, 5/25. Follow up with neurology in 2 weeks.  Your blood work and ultrasound also showed that you are 7+ weeks pregnant. I want you to call the women's outpatient clinic number to establish with an OBGYN.   As discussed, remember to avoid driving, swimming, or anything else that may be a risk with your seizure.

## 2018-06-27 LAB — RH IG WORKUP (INCLUDES ABO/RH)
ABO/RH(D): A NEG
Antibody Screen: NEGATIVE
Gestational Age(Wks): 4
Unit division: 0

## 2018-06-28 ENCOUNTER — Encounter: Payer: Self-pay | Admitting: Neurology

## 2018-07-11 ENCOUNTER — Telehealth: Payer: Self-pay

## 2018-07-11 NOTE — Telephone Encounter (Signed)
The patient called in to schedule an appointment. Stated she recently found out she was pregnant and would like to start care at our clinic. She stated she visited the hospital and they told her to call. She also stated she is high risk due to epilepsy and hr negative.

## 2018-07-13 ENCOUNTER — Telehealth: Payer: Self-pay | Admitting: Obstetrics & Gynecology

## 2018-07-13 ENCOUNTER — Other Ambulatory Visit: Payer: Self-pay

## 2018-07-13 ENCOUNTER — Telehealth (INDEPENDENT_AMBULATORY_CARE_PROVIDER_SITE_OTHER): Payer: Self-pay | Admitting: *Deleted

## 2018-07-13 ENCOUNTER — Encounter: Payer: Self-pay | Admitting: *Deleted

## 2018-07-13 DIAGNOSIS — Z6791 Unspecified blood type, Rh negative: Secondary | ICD-10-CM

## 2018-07-13 DIAGNOSIS — O0990 Supervision of high risk pregnancy, unspecified, unspecified trimester: Secondary | ICD-10-CM | POA: Insufficient documentation

## 2018-07-13 DIAGNOSIS — G40909 Epilepsy, unspecified, not intractable, without status epilepticus: Secondary | ICD-10-CM

## 2018-07-13 NOTE — Progress Notes (Signed)
I connected with  Renee Velez on 07/13/18 at  2:15 PM EDT by telephone and verified that I am speaking with the correct person using two identifiers.   I discussed the limitations, risks, security and privacy concerns of performing an evaluation and management service by telephone and the availability of in person appointments. I also discussed with the patient that there may be a patient responsible charge related to this service. The patient expressed understanding and agreed to proceed.  New Ob intake completed via telephone. Pt was unable to use MyChart video due to the camera is not functional on her phone. Pt was advised that her prenatal care will consist of a combination of virtual visits as well as face to visits. [id number] visitation policy explained. Per chart review, pt has history of GHTN however she denies having any knowledge of this problem. Pt states she is not taking Keppra as prescribed due to expense and she has no insurance. She will be applying for Medicaid. Pt agrees to check BP once weekly and enter results to [facility] app. She states she has access to BP cuff from family member. Pt voiced understanding of all information and instructions given.   Day, Ronnell Freshwater, RN 07/13/2018  4:59 PM

## 2018-07-13 NOTE — Telephone Encounter (Signed)
Spoke with patient about her virtual appointment on 6/10 @ 2:15. Patient was instructed that the visit will be a mychart visit and she verbalized that she had the app already downloaded on the device that she will be using for her appointment.

## 2018-07-14 ENCOUNTER — Other Ambulatory Visit: Payer: Self-pay | Admitting: *Deleted

## 2018-07-14 ENCOUNTER — Encounter: Payer: Self-pay | Admitting: *Deleted

## 2018-07-14 MED ORDER — FOLIC ACID 1 MG PO TABS: 1.0000 mg | ORAL_TABLET | Freq: Every day | ORAL | 10 refills | Status: DC

## 2018-07-14 NOTE — Progress Notes (Signed)
I have reviewed the chart and agree with nursing staff's documentation of this patient's encounter. Patient will need to take supplemental folate (1 g daily) when she restarts her seizure medications.  Verita Schneiders, MD 07/14/2018 9:45 AM

## 2018-07-27 ENCOUNTER — Encounter: Payer: Self-pay | Admitting: Emergency Medicine

## 2018-07-27 ENCOUNTER — Encounter: Payer: Self-pay | Admitting: Obstetrics & Gynecology

## 2018-07-27 ENCOUNTER — Other Ambulatory Visit (HOSPITAL_COMMUNITY)
Admission: RE | Admit: 2018-07-27 | Payer: Medicaid Other | Source: Ambulatory Visit | Admitting: Obstetrics & Gynecology

## 2018-07-27 ENCOUNTER — Ambulatory Visit: Payer: Self-pay | Admitting: Obstetrics & Gynecology

## 2018-07-27 ENCOUNTER — Other Ambulatory Visit: Payer: Self-pay

## 2018-07-27 VITALS — BP 104/65 | HR 65 | Wt 141.5 lb

## 2018-07-27 DIAGNOSIS — G40909 Epilepsy, unspecified, not intractable, without status epilepticus: Secondary | ICD-10-CM

## 2018-07-27 DIAGNOSIS — F319 Bipolar disorder, unspecified: Secondary | ICD-10-CM

## 2018-07-27 DIAGNOSIS — N760 Acute vaginitis: Secondary | ICD-10-CM

## 2018-07-27 DIAGNOSIS — Z6791 Unspecified blood type, Rh negative: Secondary | ICD-10-CM

## 2018-07-27 DIAGNOSIS — B9689 Other specified bacterial agents as the cause of diseases classified elsewhere: Secondary | ICD-10-CM

## 2018-07-27 DIAGNOSIS — O26899 Other specified pregnancy related conditions, unspecified trimester: Secondary | ICD-10-CM

## 2018-07-27 DIAGNOSIS — Z8759 Personal history of other complications of pregnancy, childbirth and the puerperium: Secondary | ICD-10-CM

## 2018-07-27 DIAGNOSIS — O0990 Supervision of high risk pregnancy, unspecified, unspecified trimester: Secondary | ICD-10-CM

## 2018-07-27 MED ORDER — ASPIRIN EC 81 MG PO TBEC: 81.0000 mg | DELAYED_RELEASE_TABLET | Freq: Every day | ORAL | 2 refills | Status: DC

## 2018-07-27 NOTE — Patient Instructions (Signed)
First Trimester of Pregnancy  The first trimester of pregnancy is from week 1 until the end of week 13 (months 1 through 3). A week after a sperm fertilizes an egg, the egg will implant on the wall of the uterus. This embryo will begin to develop into a baby. Genes from you and your partner will form the baby. The female genes will determine whether the baby will be a boy or a girl. At 6-8 weeks, the eyes and face will be formed, and the heartbeat can be seen on ultrasound. At the end of 12 weeks, all the baby's organs will be formed.  Now that you are pregnant, you will want to do everything you can to have a healthy baby. Two of the most important things are to get good prenatal care and to follow your health care provider's instructions. Prenatal care is all the medical care you receive before the baby's birth. This care will help prevent, find, and treat any problems during the pregnancy and childbirth.  Body changes during your first trimester  Your body goes through many changes during pregnancy. The changes vary from woman to woman.   You may gain or lose a couple of pounds at first.   You may feel sick to your stomach (nauseous) and you may throw up (vomit). If the vomiting is uncontrollable, call your health care provider.   You may tire easily.   You may develop headaches that can be relieved by medicines. All medicines should be approved by your health care provider.   You may urinate more often. Painful urination may mean you have a bladder infection.   You may develop heartburn as a result of your pregnancy.   You may develop constipation because certain hormones are causing the muscles that push stool through your intestines to slow down.   You may develop hemorrhoids or swollen veins (varicose veins).   Your breasts may begin to grow larger and become tender. Your nipples may stick out more, and the tissue that surrounds them (areola) may become darker.   Your gums may bleed and may be  sensitive to brushing and flossing.   Dark spots or blotches (chloasma, mask of pregnancy) may develop on your face. This will likely fade after the baby is born.   Your menstrual periods will stop.   You may have a loss of appetite.   You may develop cravings for certain kinds of food.   You may have changes in your emotions from day to day, such as being excited to be pregnant or being concerned that something may go wrong with the pregnancy and baby.   You may have more vivid and strange dreams.   You may have changes in your hair. These can include thickening of your hair, rapid growth, and changes in texture. Some women also have hair loss during or after pregnancy, or hair that feels dry or thin. Your hair will most likely return to normal after your baby is born.  What to expect at prenatal visits  During a routine prenatal visit:   You will be weighed to make sure you and the baby are growing normally.   Your blood pressure will be taken.   Your abdomen will be measured to track your baby's growth.   The fetal heartbeat will be listened to between weeks 10 and 14 of your pregnancy.   Test results from any previous visits will be discussed.  Your health care provider may ask you:     How you are feeling.   If you are feeling the baby move.   If you have had any abnormal symptoms, such as leaking fluid, bleeding, severe headaches, or abdominal cramping.   If you are using any tobacco products, including cigarettes, chewing tobacco, and electronic cigarettes.   If you have any questions.  Other tests that may be performed during your first trimester include:   Blood tests to find your blood type and to check for the presence of any previous infections. The tests will also be used to check for low iron levels (anemia) and protein on red blood cells (Rh antibodies). Depending on your risk factors, or if you previously had diabetes during pregnancy, you may have tests to check for high blood sugar  that affects pregnant women (gestational diabetes).   Urine tests to check for infections, diabetes, or protein in the urine.   An ultrasound to confirm the proper growth and development of the baby.   Fetal screens for spinal cord problems (spina bifida) and Down syndrome.   HIV (human immunodeficiency virus) testing. Routine prenatal testing includes screening for HIV, unless you choose not to have this test.   You may need other tests to make sure you and the baby are doing well.  Follow these instructions at home:  Medicines   Follow your health care provider's instructions regarding medicine use. Specific medicines may be either safe or unsafe to take during pregnancy.   Take a prenatal vitamin that contains at least 600 micrograms (mcg) of folic acid.   If you develop constipation, try taking a stool softener if your health care provider approves.  Eating and drinking     Eat a balanced diet that includes fresh fruits and vegetables, whole grains, good sources of protein such as meat, eggs, or tofu, and low-fat dairy. Your health care provider will help you determine the amount of weight gain that is right for you.   Avoid raw meat and uncooked cheese. These carry germs that can cause birth defects in the baby.   Eating four or five small meals rather than three large meals a day may help relieve nausea and vomiting. If you start to feel nauseous, eating a few soda crackers can be helpful. Drinking liquids between meals, instead of during meals, also seems to help ease nausea and vomiting.   Limit foods that are high in fat and processed sugars, such as fried and sweet foods.   To prevent constipation:  ? Eat foods that are high in fiber, such as fresh fruits and vegetables, whole grains, and beans.  ? Drink enough fluid to keep your urine clear or pale yellow.  Activity   Exercise only as directed by your health care provider. Most women can continue their usual exercise routine during  pregnancy. Try to exercise for 30 minutes at least 5 days a week. Exercising will help you:  ? Control your weight.  ? Stay in shape.  ? Be prepared for labor and delivery.   Experiencing pain or cramping in the lower abdomen or lower back is a good sign that you should stop exercising. Check with your health care provider before continuing with normal exercises.   Try to avoid standing for long periods of time. Move your legs often if you must stand in one place for a long time.   Avoid heavy lifting.   Wear low-heeled shoes and practice good posture.   You may continue to have sex unless your health care   provider tells you not to.  Relieving pain and discomfort   Wear a good support bra to relieve breast tenderness.   Take warm sitz baths to soothe any pain or discomfort caused by hemorrhoids. Use hemorrhoid cream if your health care provider approves.   Rest with your legs elevated if you have leg cramps or low back pain.   If you develop varicose veins in your legs, wear support hose. Elevate your feet for 15 minutes, 3-4 times a day. Limit salt in your diet.  Prenatal care   Schedule your prenatal visits by the twelfth week of pregnancy. They are usually scheduled monthly at first, then more often in the last 2 months before delivery.   Write down your questions. Take them to your prenatal visits.   Keep all your prenatal visits as told by your health care provider. This is important.  Safety   Wear your seat belt at all times when driving.   Make a list of emergency phone numbers, including numbers for family, friends, the hospital, and police and fire departments.  General instructions   Ask your health care provider for a referral to a local prenatal education class. Begin classes no later than the beginning of month 6 of your pregnancy.   Ask for help if you have counseling or nutritional needs during pregnancy. Your health care provider can offer advice or refer you to specialists for help  with various needs.   Do not use hot tubs, steam rooms, or saunas.   Do not douche or use tampons or scented sanitary pads.   Do not cross your legs for long periods of time.   Avoid cat litter boxes and soil used by cats. These carry germs that can cause birth defects in the baby and possibly loss of the fetus by miscarriage or stillbirth.   Avoid all smoking, herbs, alcohol, and medicines not prescribed by your health care provider. Chemicals in these products affect the formation and growth of the baby.   Do not use any products that contain nicotine or tobacco, such as cigarettes and e-cigarettes. If you need help quitting, ask your health care provider. You may receive counseling support and other resources to help you quit.   Schedule a dentist appointment. At home, brush your teeth with a soft toothbrush and be gentle when you floss.  Contact a health care provider if:   You have dizziness.   You have mild pelvic cramps, pelvic pressure, or nagging pain in the abdominal area.   You have persistent nausea, vomiting, or diarrhea.   You have a bad smelling vaginal discharge.   You have pain when you urinate.   You notice increased swelling in your face, hands, legs, or ankles.   You are exposed to fifth disease or chickenpox.   You are exposed to German measles (rubella) and have never had it.  Get help right away if:   You have a fever.   You are leaking fluid from your vagina.   You have spotting or bleeding from your vagina.   You have severe abdominal cramping or pain.   You have rapid weight gain or loss.   You vomit blood or material that looks like coffee grounds.   You develop a severe headache.   You have shortness of breath.   You have any kind of trauma, such as from a fall or a car accident.  Summary   The first trimester of pregnancy is from week 1 until   the end of week 13 (months 1 through 3).   Your body goes through many changes during pregnancy. The changes vary from  woman to woman.   You will have routine prenatal visits. During those visits, your health care provider will examine you, discuss any test results you may have, and talk with you about how you are feeling.  This information is not intended to replace advice given to you by your health care provider. Make sure you discuss any questions you have with your health care provider.  Document Released: 01/13/2001 Document Revised: 01/01/2016 Document Reviewed: 01/01/2016  Elsevier Interactive Patient Education  2019 Elsevier Inc.

## 2018-07-27 NOTE — Progress Notes (Signed)
Called and left pt a voicemail informing her of anatomy ultrasound scheduled for 8/19 @ 10 am. Mychart message was sent as well with this information.

## 2018-07-27 NOTE — Progress Notes (Signed)
History:   Renee Velez is a 23 y.o. [id number] at [redacted]w[redacted]d by early ultrasound being seen today for her first obstetrical visit.  Her obstetrical history is significant for three term SVDs, history of epilepsy currently on no meds. Has been on Keppra in the past, was on Lamictal last pregnancy. Just moved from [place], has appointment with Neurology soon. Patient does intend to breast feed. Pregnancy history fully reviewed.  Patient reports no complaints.      HISTORY: OB History  Gravida Para Term Preterm AB Living  5 3 3  0 1 3  SAB TAB Ectopic Multiple Live Births  1 0 0 0 3    # Outcome Date GA Lbr Len/2nd Weight Sex Delivery Anes PTL Lv  5 Current           4 Term 01/13/18 [redacted]w[redacted]d  6 lb 7 oz (2.92 kg) M Vag-Spont   LIV  3 SAB 01/04/17          2 Term 10/15/16 [redacted]w[redacted]d  5 lb 15 oz (2.693 kg) M Vag-Spont   LIV     Birth Comments: SGA, nuchal cord x3, went to NICU x1 day, developed ARDS @ 69 months of age  51 Term 10/29/14 [redacted]w[redacted]d  6 lb 5 oz (2.863 kg) F Vag-Spont  N LIV    Last pap smear was done 2019 and was normal  Past Medical History:  Diagnosis Date  . Anxiety   . Chlamydia   . Epilepsy ([place])   . Gonorrhea   . Post partum depression 2018  . Seizure [facility])    Past Surgical History:  Procedure Laterality Date  . NO PAST SURGERIES     Family History  Problem Relation Age of Onset  . Hypertension Father   . Cancer Maternal Grandmother   . Cancer Paternal Grandmother   . Diabetes Paternal Grandfather   . Thyroid disease Sister   . ADD / ADHD Brother    Social History   Tobacco Use  . Smoking status: Current Every Day Smoker    Packs/day: 0.25    Types: Cigarettes    Last attempt to quit: 02/28/2016    Years since quitting: 2.4  . Smokeless tobacco: Never Used  Substance Use Topics  . Alcohol use: Not Currently  . Drug use: No   Allergies  Allergen Reactions  . Nickel Rash   Current Outpatient Medications on File Prior to Visit  Medication Sig Dispense Refill  .  Prenatal Vit-Fe Fumarate-FA (PRENATAL MULTIVITAMIN) TABS tablet Take 1 tablet by mouth daily at 12 noon.    . Doxylamine-Pyridoxine 10-10 MG TBEC Take 1 tablet by mouth every 8 (eight) hours as needed (nausea). (Patient not taking: Reported on 07/13/2018) 20 tablet 0  . folic acid (FOLVITE) 1 MG tablet Take 1 tablet (1 mg total) by mouth daily. (Patient not taking: Reported on 07/27/2018) 30 tablet 10  . levETIRAcetam (KEPPRA) 1000 MG tablet Take 1 tablet (1,000 mg total) by mouth 2 (two) times daily for 30 days. (Patient not taking: Reported on 07/13/2018) 60 tablet 0   No current facility-administered medications on file prior to visit.     Review of Systems Pertinent items noted in HPI and remainder of comprehensive ROS otherwise negative. Physical Exam:   Vitals:   07/27/18 1015  BP: 104/65  Pulse: 65  Weight: 141 lb 8 oz (64.2 kg)   Fetal Heart Rate (bpm): 161 Uterus:     Pelvic Exam: Perineum: no hemorrhoids, normal perineum   Vulva:  normal external genitalia, no lesions   Vagina:  normal mucosa, normal discharge  System: General: well-developed, well-nourished female in no acute distress   Breasts:  normal appearance, no masses or tenderness bilaterally   Skin: normal coloration and turgor, no rashes   Neurologic: oriented, normal, negative, normal mood   Extremities: normal strength, tone, and muscle mass, ROM of all joints is normal   HEENT PERRLA, extraocular movement intact and sclera clear, anicteric   Mouth/Teeth mucous membranes moist, pharynx normal without lesions and dental hygiene good   Neck supple and no masses   Cardiovascular: regular rate and rhythm   Respiratory:  no respiratory distress, normal breath sounds   Abdomen: soft, non-tender; bowel sounds normal; no masses,  no organomegaly      Assessment:    Pregnancy: [id number] Patient Active Problem List   Diagnosis Date Noted  . Supervision of high risk pregnancy, antepartum 07/13/2018  . Rh negative,  antepartum 07/13/2018  . History of gestational hypertension 04/13/2016  . Nausea and vomiting during pregnancy 04/13/2016  . Seizure disorder (HCC) 09/10/2015  . Bipolar 1 disorder (HCC) 09/10/2015  . Tobacco use disorder 09/10/2015     Plan:    1. History of gestational hypertension ASA prescribed. Labs checked today. - aspirin EC 81 MG tablet; Take 1 tablet (81 mg total) by mouth daily. Take after 12 weeks for prevention of preeclampsia later in pregnancy  Dispense: 300 tablet; Refill: 2 - Comprehensive metabolic panel - Protein / creatinine ratio, urine  2. Rh negative, antepartum Will get rhogam at 28 weeks  3. Seizure disorder [facility](HCC) Will meet with Neurology soon, will follow up recommendations. If she starts AED, will need supplemental folic acid, patient is aware.  4. Bipolar 1 disorder (HCC) No meds, stable. [facility]  Referral  - Amb ref to [facility]  5. Supervision of high risk pregnancy, antepartum Initial labs drawn. Continue prenatal vitamins. Genetic Screening discussed, considering NIPS. Ultrasound discussed; fetal anatomic survey: ordered. Problem list reviewed and updated. The nature of [facility] with multiple MDs and other Advanced Practice Providers was explained to patient; also emphasized that residents, students are part of our team. Routine obstetric precautions reviewed. Return in about 4 weeks (around 08/24/2018) for Virtual LOB Visit.     Jaynie CollinsUGONNA  ANYANWU, MD, FACOG Obstetrician & Gynecologist, [facility] for [facility], [facility]

## 2018-07-28 LAB — COMPREHENSIVE METABOLIC PANEL
ALT: 6 IU/L (ref 0–32)
AST: 15 IU/L (ref 0–40)
Albumin/Globulin Ratio: 1.6 (ref 1.2–2.2)
Albumin: 4 g/dL (ref 3.9–5.0)
Alkaline Phosphatase: 55 IU/L (ref 39–117)
BUN/Creatinine Ratio: 8 — ABNORMAL LOW (ref 9–23)
BUN: 5 mg/dL — ABNORMAL LOW (ref 6–20)
Bilirubin Total: 0.3 mg/dL (ref 0.0–1.2)
CO2: 21 mmol/L (ref 20–29)
Calcium: 9.2 mg/dL (ref 8.7–10.2)
Chloride: 104 mmol/L (ref 96–106)
Creatinine, Ser: 0.64 mg/dL (ref 0.57–1.00)
GFR calc Af Amer: 145 mL/min/{1.73_m2} (ref >59)
GFR calc non Af Amer: 126 mL/min/{1.73_m2} (ref >59)
Globulin, Total: 2.5 g/dL (ref 1.5–4.5)
Glucose: 81 mg/dL (ref 65–99)
Potassium: 4.3 mmol/L (ref 3.5–5.2)
Sodium: 138 mmol/L (ref 134–144)
Total Protein: 6.5 g/dL (ref 6.0–8.5)

## 2018-07-28 LAB — AB SCR+ANTIBODY ID: Antibody Screen: POSITIVE — AB

## 2018-07-28 LAB — CERVICOVAGINAL ANCILLARY ONLY
Bacterial vaginitis: POSITIVE — AB
Candida vaginitis: NEGATIVE
Chlamydia: NEGATIVE
Neisseria Gonorrhea: NEGATIVE
Trichomonas: NEGATIVE

## 2018-07-28 LAB — OBSTETRIC PANEL, INCLUDING HIV
Basophils Absolute: 0.1 10*3/uL (ref 0.0–0.2)
Basos: 1 %
EOS (ABSOLUTE): 0.2 10*3/uL (ref 0.0–0.4)
Eos: 2 %
HIV Screen 4th Generation wRfx: NONREACTIVE
Hematocrit: 33.7 % — ABNORMAL LOW (ref 34.0–46.6)
Hemoglobin: 10.8 g/dL — ABNORMAL LOW (ref 11.1–15.9)
Hepatitis B Surface Ag: NEGATIVE
Immature Grans (Abs): 0 10*3/uL (ref 0.0–0.1)
Immature Granulocytes: 0 %
Lymphocytes Absolute: 1.9 10*3/uL (ref 0.7–3.1)
Lymphs: 22 %
MCH: 28.2 pg (ref 26.6–33.0)
MCHC: 32 g/dL (ref 31.5–35.7)
MCV: 88 fL (ref 79–97)
Monocytes Absolute: 0.7 10*3/uL (ref 0.1–0.9)
Monocytes: 8 %
Neutrophils Absolute: 5.8 10*3/uL (ref 1.4–7.0)
Neutrophils: 67 %
Platelets: 284 10*3/uL (ref 150–450)
RBC: 3.83 x10E6/uL (ref 3.77–5.28)
RDW: 14.1 % (ref 11.7–15.4)
RPR Ser Ql: NONREACTIVE
Rh Factor: NEGATIVE
Rubella Antibodies, IGG: 1.3 index (ref >0.99)
WBC: 8.6 10*3/uL (ref 3.4–10.8)

## 2018-07-28 LAB — HEMOGLOBIN A1C
Est. average glucose Bld gHb Est-mCnc: 105 mg/dL
Hgb A1c MFr Bld: 5.3 % (ref 4.8–5.6)

## 2018-07-28 LAB — PROTEIN / CREATININE RATIO, URINE
Creatinine, Urine: 124.6 mg/dL
Protein, Ur: 18.1 mg/dL
Protein/Creat Ratio: 145 mg/g creat (ref 0–200)

## 2018-07-29 LAB — URINE CULTURE, OB REFLEX

## 2018-07-29 LAB — CULTURE, OB URINE

## 2018-07-29 MED ORDER — METRONIDAZOLE 500 MG PO TABS: 500.0000 mg | ORAL_TABLET | Freq: Two times a day (BID) | ORAL | 0 refills | Status: DC

## 2018-07-29 NOTE — Addendum Note (Signed)
Addended by: Verita Schneiders A on: 07/29/2018 12:18 PM   Modules accepted: Orders

## 2018-08-03 ENCOUNTER — Other Ambulatory Visit: Payer: Self-pay

## 2018-08-03 ENCOUNTER — Telehealth: Payer: Self-pay | Admitting: Neurology

## 2018-08-24 ENCOUNTER — Other Ambulatory Visit: Payer: Self-pay

## 2018-08-24 ENCOUNTER — Telehealth: Payer: Self-pay | Admitting: Certified Nurse Midwife

## 2018-08-24 ENCOUNTER — Telehealth (INDEPENDENT_AMBULATORY_CARE_PROVIDER_SITE_OTHER): Payer: Self-pay | Admitting: Certified Nurse Midwife

## 2018-08-24 DIAGNOSIS — Z3A15 15 weeks gestation of pregnancy: Secondary | ICD-10-CM

## 2018-08-24 DIAGNOSIS — O0992 Supervision of high risk pregnancy, unspecified, second trimester: Secondary | ICD-10-CM

## 2018-08-24 DIAGNOSIS — O0990 Supervision of high risk pregnancy, unspecified, unspecified trimester: Secondary | ICD-10-CM

## 2018-08-24 NOTE — Progress Notes (Signed)
   [facility] VIRTUAL VIDEO VISIT ENCOUNTER NOTE  Provider location: Center for [employer] at [facility]   I connected with Pearson Grippe on 08/24/18 at  8:15 AM EDT by MyChart Video Encounter at home and verified that I am speaking with the correct person using two identifiers.   I discussed the limitations, risks, security and privacy concerns of performing an evaluation and management service virtually and the availability of in person appointments. I also discussed with the patient that there may be a patient responsible charge related to this service. The patient expressed understanding and agreed to proceed. Subjective:  Renee Velez is a 23 y.o. [id number] at [redacted]w[redacted]d being seen today for ongoing prenatal care.  She is currently monitored for the following issues for this low-risk pregnancy and has Seizure disorder ([place]); Bipolar 1 disorder ([place]); Tobacco use disorder; History of gestational hypertension; Nausea and vomiting during pregnancy; Supervision of high risk pregnancy, antepartum; and Rh negative, antepartum on their problem list.  Patient reports external vaginal irritation since completed Flagyl for BV. Denies discharge, itching, and odor. Denies painful sex.  Contractions: Regular. Vag. Bleeding: None.  Movement: Present. Denies any leaking of fluid.   The following portions of the patient's history were reviewed and updated as appropriate: allergies, current medications, past family history, past medical history, past social history, past surgical history and problem list.   Objective:  There were no vitals filed for this visit. Does not have BP cuff yet  Fetal Status:     Movement: Present     General:  Alert, oriented and cooperative. Patient is in no acute distress.  Respiratory: Normal respiratory effort, no problems with respiration noted  Mental Status: Normal mood and affect. Normal behavior. Normal judgment and thought content.  Rest of physical  exam deferred due to type of encounter  Imaging: No results found.  Assessment and Plan:  Pregnancy: [id number] at [redacted]w[redacted]d 1. Supervision of high risk pregnancy, antepartum - anatomy US scheduled - needs BP cuff for baby Rx- nursing to f/u - visit ended d/t internet disconnection and attempted to call pt back by phone x2 with no answer  Preterm labor symptoms and general obstetric precautions including but not limited to vaginal bleeding, contractions, leaking of fluid and fetal movement were reviewed in detail with the patient. I discussed the assessment and treatment plan with the patient. The patient was provided an opportunity to ask questions and all were answered. The patient agreed with the plan and demonstrated an understanding of the instructions. The patient was advised to call back or seek an in-person office evaluation/go to [facility] at [facility] for any urgent or concerning symptoms. Please refer to After Visit Summary for other counseling recommendations.   I provided 4 minutes of face-to-face time during this encounter.  Return in about 5 weeks (around 09/28/2018).- virtual visit  Future Appointments  Date Time Provider [place]  09/21/2018 10:00 AM [place] [facility]  09/21/2018 10:00 AM [facility] [place] 3 [facility]-M[facility]US M[facility]-US    Clayson Riling, [place] for [employer], [place]

## 2018-08-24 NOTE — Telephone Encounter (Signed)
Attempted to call patient with her next ob appointment ( 8/26 @ 8:15-mychart). No answer, left voicemail with appointment information. Patient instructed to give the office a call back if she is needing to rescheduled. Appointment reminder mailed

## 2018-09-21 ENCOUNTER — Ambulatory Visit (INDEPENDENT_AMBULATORY_CARE_PROVIDER_SITE_OTHER): Payer: Self-pay

## 2018-09-21 ENCOUNTER — Other Ambulatory Visit: Payer: Self-pay | Admitting: Obstetrics & Gynecology

## 2018-09-21 ENCOUNTER — Ambulatory Visit (HOSPITAL_COMMUNITY): Payer: Medicaid Other | Admitting: *Deleted

## 2018-09-21 ENCOUNTER — Encounter: Payer: Self-pay | Admitting: Obstetrics & Gynecology

## 2018-09-21 ENCOUNTER — Other Ambulatory Visit: Payer: Self-pay

## 2018-09-21 ENCOUNTER — Ambulatory Visit (HOSPITAL_COMMUNITY): Payer: Medicaid Other

## 2018-09-21 ENCOUNTER — Ambulatory Visit (HOSPITAL_COMMUNITY)
Admission: RE | Admit: 2018-09-21 | Discharge: 2018-09-21 | Disposition: A | Discharge status: Home / Self Care | Payer: Medicaid Other | Source: Ambulatory Visit | Attending: Obstetrics and Gynecology | Admitting: Obstetrics and Gynecology

## 2018-09-21 ENCOUNTER — Other Ambulatory Visit (HOSPITAL_COMMUNITY): Payer: Self-pay | Admitting: *Deleted

## 2018-09-21 ENCOUNTER — Encounter (HOSPITAL_COMMUNITY): Payer: Self-pay

## 2018-09-21 VITALS — BP 115/57 | HR 55 | Temp 97.9°F

## 2018-09-21 DIAGNOSIS — N390 Urinary tract infection, site not specified: Secondary | ICD-10-CM

## 2018-09-21 DIAGNOSIS — O365920 Maternal care for other known or suspected poor fetal growth, second trimester, not applicable or unspecified: Secondary | ICD-10-CM | POA: Diagnosis present

## 2018-09-21 DIAGNOSIS — O99342 Other mental disorders complicating pregnancy, second trimester: Secondary | ICD-10-CM

## 2018-09-21 DIAGNOSIS — F319 Bipolar disorder, unspecified: Secondary | ICD-10-CM | POA: Diagnosis not present

## 2018-09-21 DIAGNOSIS — O360120 Maternal care for anti-D [Rh] antibodies, second trimester, not applicable or unspecified: Secondary | ICD-10-CM

## 2018-09-21 DIAGNOSIS — O0990 Supervision of high risk pregnancy, unspecified, unspecified trimester: Secondary | ICD-10-CM

## 2018-09-21 DIAGNOSIS — G40909 Epilepsy, unspecified, not intractable, without status epilepticus: Secondary | ICD-10-CM | POA: Insufficient documentation

## 2018-09-21 DIAGNOSIS — Z363 Encounter for antenatal screening for malformations: Secondary | ICD-10-CM

## 2018-09-21 DIAGNOSIS — O09292 Supervision of pregnancy with other poor reproductive or obstetric history, second trimester: Secondary | ICD-10-CM | POA: Diagnosis not present

## 2018-09-21 DIAGNOSIS — O99352 Diseases of the nervous system complicating pregnancy, second trimester: Secondary | ICD-10-CM | POA: Diagnosis present

## 2018-09-21 DIAGNOSIS — O365990 Maternal care for other known or suspected poor fetal growth, unspecified trimester, not applicable or unspecified: Secondary | ICD-10-CM | POA: Insufficient documentation

## 2018-09-21 DIAGNOSIS — Z3A19 19 weeks gestation of pregnancy: Secondary | ICD-10-CM

## 2018-09-21 LAB — POCT URINALYSIS DIP (DEVICE)
Bilirubin Urine: NEGATIVE
Glucose, UA: NEGATIVE mg/dL
Hgb urine dipstick: NEGATIVE
Ketones, ur: NEGATIVE mg/dL
Nitrite: NEGATIVE
Protein, ur: NEGATIVE mg/dL
Specific Gravity, Urine: 1.015 (ref 1.005–1.030)
Urobilinogen, UA: 0.2 mg/dL (ref 0.0–1.0)
pH: 7.5 (ref 5.0–8.0)

## 2018-09-21 NOTE — Progress Notes (Signed)
Pt here today for questionable UTI.  Pt reports that she dribbles urine when she laughs.  I explained to the pt that it is normal to have dribbling after multiple vaginal deliveries.  Pt's urine shows small leukocytes.  Pt advised that we will continue to monitor.  Pt verbalized understanding with no further questions.  Mel Almond, RN 09/18/18

## 2018-09-21 NOTE — Progress Notes (Signed)
Patient seen and assessed by nursing staff.  Agree with documentation and plan.  

## 2018-09-27 ENCOUNTER — Telehealth: Payer: Self-pay | Admitting: Medical

## 2018-09-27 NOTE — Telephone Encounter (Signed)
Attempted to call patient about her appointment on 8/26 @ 8:15. No answer, and could not leave a message because the number stated that the call could not be completed.

## 2018-09-28 ENCOUNTER — Encounter: Payer: Self-pay | Admitting: Medical

## 2018-09-28 ENCOUNTER — Other Ambulatory Visit: Payer: Self-pay

## 2018-09-28 ENCOUNTER — Encounter: Payer: Self-pay | Admitting: Family Medicine

## 2018-09-28 ENCOUNTER — Telehealth (HOSPITAL_COMMUNITY): Payer: Self-pay | Admitting: *Deleted

## 2018-09-28 ENCOUNTER — Telehealth: Payer: Self-pay | Admitting: Family Medicine

## 2018-09-28 NOTE — Progress Notes (Signed)
8:22a, called pt for My Chart visit using # ending [phone], was told this was the wrong #, tried # ending [phone], received message stating pt was not available at this time try again later, no option to leave a message.   9:03a-2nd attempt , no answer, no VM to leave message.

## 2018-09-28 NOTE — Patient Instructions (Signed)
Please reschedule your appointment as soon as possible 

## 2018-09-28 NOTE — Telephone Encounter (Signed)
TC, left message for patient to return call.  After reviewing her lab results Dr. Gertie Exon would like for her to have a limited ultrasound next week in [facility].  Appointment was made for 10/05/18 at 8:30am.

## 2018-09-28 NOTE — Telephone Encounter (Signed)
Called the patient in regards to the missed appointment. Received the message we're sorry, your call can not be completed at this time. Please hang up and try your call again later.

## 2018-09-30 ENCOUNTER — Telehealth (HOSPITAL_COMMUNITY): Payer: Self-pay | Admitting: *Deleted

## 2018-09-30 ENCOUNTER — Other Ambulatory Visit (HOSPITAL_COMMUNITY): Payer: Self-pay | Admitting: *Deleted

## 2018-09-30 DIAGNOSIS — O365920 Maternal care for other known or suspected poor fetal growth, second trimester, not applicable or unspecified: Secondary | ICD-10-CM

## 2018-09-30 NOTE — Telephone Encounter (Signed)
TC, patients name and DOB verified.  Normal results from cell free DNA given. [place], CMV and toxo results given and notified of appointment for 10/05/18 @ 8:30.  Pt voiced understanding.

## 2018-10-05 ENCOUNTER — Other Ambulatory Visit: Payer: Self-pay

## 2018-10-05 ENCOUNTER — Other Ambulatory Visit (HOSPITAL_COMMUNITY): Payer: Self-pay | Admitting: *Deleted

## 2018-10-05 ENCOUNTER — Encounter (HOSPITAL_COMMUNITY): Payer: Self-pay

## 2018-10-05 ENCOUNTER — Ambulatory Visit (HOSPITAL_COMMUNITY): Payer: Medicaid Other | Admitting: *Deleted

## 2018-10-05 ENCOUNTER — Other Ambulatory Visit (HOSPITAL_COMMUNITY): Payer: Self-pay | Admitting: Maternal & Fetal Medicine

## 2018-10-05 ENCOUNTER — Ambulatory Visit (HOSPITAL_COMMUNITY)
Admission: RE | Admit: 2018-10-05 | Discharge: 2018-10-05 | Disposition: A | Discharge status: Home / Self Care | Payer: Medicaid Other | Source: Ambulatory Visit | Attending: Obstetrics and Gynecology | Admitting: Obstetrics and Gynecology

## 2018-10-05 DIAGNOSIS — Z6791 Unspecified blood type, Rh negative: Secondary | ICD-10-CM | POA: Diagnosis present

## 2018-10-05 DIAGNOSIS — G40909 Epilepsy, unspecified, not intractable, without status epilepticus: Secondary | ICD-10-CM

## 2018-10-05 DIAGNOSIS — Z3A21 21 weeks gestation of pregnancy: Secondary | ICD-10-CM

## 2018-10-05 DIAGNOSIS — O360120 Maternal care for anti-D [Rh] antibodies, second trimester, not applicable or unspecified: Secondary | ICD-10-CM

## 2018-10-05 DIAGNOSIS — O360121 Maternal care for anti-D [Rh] antibodies, second trimester, fetus 1: Secondary | ICD-10-CM

## 2018-10-05 DIAGNOSIS — O26899 Other specified pregnancy related conditions, unspecified trimester: Secondary | ICD-10-CM | POA: Diagnosis present

## 2018-10-05 DIAGNOSIS — O99342 Other mental disorders complicating pregnancy, second trimester: Secondary | ICD-10-CM | POA: Diagnosis not present

## 2018-10-05 DIAGNOSIS — O99352 Diseases of the nervous system complicating pregnancy, second trimester: Secondary | ICD-10-CM

## 2018-10-05 DIAGNOSIS — F319 Bipolar disorder, unspecified: Secondary | ICD-10-CM | POA: Diagnosis not present

## 2018-10-05 DIAGNOSIS — O365920 Maternal care for other known or suspected poor fetal growth, second trimester, not applicable or unspecified: Secondary | ICD-10-CM | POA: Insufficient documentation

## 2018-10-05 DIAGNOSIS — O365990 Maternal care for other known or suspected poor fetal growth, unspecified trimester, not applicable or unspecified: Secondary | ICD-10-CM | POA: Diagnosis present

## 2018-10-05 DIAGNOSIS — O09292 Supervision of pregnancy with other poor reproductive or obstetric history, second trimester: Secondary | ICD-10-CM | POA: Diagnosis not present

## 2018-10-12 ENCOUNTER — Ambulatory Visit (HOSPITAL_COMMUNITY): Payer: Self-pay

## 2018-10-18 ENCOUNTER — Telehealth: Payer: Self-pay | Admitting: Nurse Practitioner

## 2018-10-18 ENCOUNTER — Other Ambulatory Visit: Payer: Self-pay

## 2018-10-18 ENCOUNTER — Encounter: Payer: Self-pay | Admitting: Nurse Practitioner

## 2018-10-18 DIAGNOSIS — Z5329 Procedure and treatment not carried out because of patient's decision for other reasons: Secondary | ICD-10-CM

## 2018-10-18 DIAGNOSIS — Z91199 Patient's noncompliance with other medical treatment and regimen due to unspecified reason: Secondary | ICD-10-CM

## 2018-10-18 NOTE — Progress Notes (Signed)
2:45p- Called pt for My Chart visit, no answer,left VM that will call back in 10 to 15 min.   3:07p-2nd attempt, no answer, will need to reschedule, left VM.

## 2018-10-18 NOTE — Telephone Encounter (Signed)
Attempted to call patient to get her rescheduled for her missed OB appointment. No answer, left voicemail for patient to give the office a call back to be rescheduled. No show letter mailed  

## 2018-10-19 ENCOUNTER — Ambulatory Visit (HOSPITAL_COMMUNITY): Payer: Medicaid Other | Admitting: *Deleted

## 2018-10-19 ENCOUNTER — Other Ambulatory Visit: Payer: Self-pay

## 2018-10-19 ENCOUNTER — Ambulatory Visit (HOSPITAL_COMMUNITY)
Admission: RE | Admit: 2018-10-19 | Discharge: 2018-10-19 | Disposition: A | Discharge status: Home / Self Care | Payer: Medicaid Other | Source: Ambulatory Visit | Attending: Maternal & Fetal Medicine | Admitting: Maternal & Fetal Medicine

## 2018-10-19 ENCOUNTER — Other Ambulatory Visit (HOSPITAL_COMMUNITY): Payer: Self-pay | Admitting: *Deleted

## 2018-10-19 ENCOUNTER — Encounter (HOSPITAL_COMMUNITY): Payer: Self-pay

## 2018-10-19 DIAGNOSIS — Z6791 Unspecified blood type, Rh negative: Secondary | ICD-10-CM

## 2018-10-19 DIAGNOSIS — O99342 Other mental disorders complicating pregnancy, second trimester: Secondary | ICD-10-CM | POA: Diagnosis not present

## 2018-10-19 DIAGNOSIS — Z3A23 23 weeks gestation of pregnancy: Secondary | ICD-10-CM

## 2018-10-19 DIAGNOSIS — O365990 Maternal care for other known or suspected poor fetal growth, unspecified trimester, not applicable or unspecified: Secondary | ICD-10-CM | POA: Insufficient documentation

## 2018-10-19 DIAGNOSIS — O99352 Diseases of the nervous system complicating pregnancy, second trimester: Secondary | ICD-10-CM

## 2018-10-19 DIAGNOSIS — O365920 Maternal care for other known or suspected poor fetal growth, second trimester, not applicable or unspecified: Secondary | ICD-10-CM

## 2018-10-19 DIAGNOSIS — O09292 Supervision of pregnancy with other poor reproductive or obstetric history, second trimester: Secondary | ICD-10-CM

## 2018-10-19 DIAGNOSIS — F319 Bipolar disorder, unspecified: Secondary | ICD-10-CM

## 2018-10-19 DIAGNOSIS — O360120 Maternal care for anti-D [Rh] antibodies, second trimester, not applicable or unspecified: Secondary | ICD-10-CM | POA: Diagnosis not present

## 2018-10-19 DIAGNOSIS — O26899 Other specified pregnancy related conditions, unspecified trimester: Secondary | ICD-10-CM | POA: Diagnosis present

## 2018-10-19 DIAGNOSIS — Z362 Encounter for other antenatal screening follow-up: Secondary | ICD-10-CM

## 2018-10-19 DIAGNOSIS — G40909 Epilepsy, unspecified, not intractable, without status epilepticus: Secondary | ICD-10-CM

## 2018-10-27 ENCOUNTER — Telehealth: Payer: Self-pay | Admitting: Obstetrics & Gynecology

## 2018-10-27 NOTE — Telephone Encounter (Signed)
Attempted to call patient about her appointment on 9/25 @ 9:55. No answer left voicemail instructing patient to wear a face mask for the entire appointment and no visitors are allowed during the visit. Patient instructed not to attend the appointment if she was any symptoms. Symptom list and office number left.

## 2018-10-28 ENCOUNTER — Telehealth: Payer: Self-pay | Admitting: Family Medicine

## 2018-10-28 ENCOUNTER — Encounter: Payer: Self-pay | Admitting: Family Medicine

## 2018-10-28 ENCOUNTER — Encounter: Payer: Self-pay | Admitting: Obstetrics & Gynecology

## 2018-10-28 NOTE — Telephone Encounter (Signed)
Called patient to get her rescheduled, was unable to leave a message

## 2018-11-01 ENCOUNTER — Ambulatory Visit (HOSPITAL_COMMUNITY): Payer: Medicaid Other | Admitting: *Deleted

## 2018-11-01 ENCOUNTER — Encounter (HOSPITAL_COMMUNITY): Payer: Self-pay

## 2018-11-01 ENCOUNTER — Other Ambulatory Visit (HOSPITAL_COMMUNITY): Payer: Self-pay | Admitting: Obstetrics and Gynecology

## 2018-11-01 ENCOUNTER — Ambulatory Visit (HOSPITAL_COMMUNITY)
Admission: RE | Admit: 2018-11-01 | Discharge: 2018-11-01 | Disposition: A | Discharge status: Home / Self Care | Payer: Medicaid Other | Source: Ambulatory Visit | Attending: Obstetrics and Gynecology | Admitting: Obstetrics and Gynecology

## 2018-11-01 ENCOUNTER — Other Ambulatory Visit: Payer: Self-pay

## 2018-11-01 DIAGNOSIS — O360120 Maternal care for anti-D [Rh] antibodies, second trimester, not applicable or unspecified: Secondary | ICD-10-CM | POA: Diagnosis not present

## 2018-11-01 DIAGNOSIS — O365920 Maternal care for other known or suspected poor fetal growth, second trimester, not applicable or unspecified: Secondary | ICD-10-CM

## 2018-11-01 DIAGNOSIS — Z6791 Unspecified blood type, Rh negative: Secondary | ICD-10-CM | POA: Diagnosis present

## 2018-11-01 DIAGNOSIS — O365990 Maternal care for other known or suspected poor fetal growth, unspecified trimester, not applicable or unspecified: Secondary | ICD-10-CM | POA: Diagnosis present

## 2018-11-01 DIAGNOSIS — O26899 Other specified pregnancy related conditions, unspecified trimester: Secondary | ICD-10-CM | POA: Insufficient documentation

## 2018-11-01 DIAGNOSIS — O09292 Supervision of pregnancy with other poor reproductive or obstetric history, second trimester: Secondary | ICD-10-CM | POA: Diagnosis not present

## 2018-11-01 DIAGNOSIS — O99342 Other mental disorders complicating pregnancy, second trimester: Secondary | ICD-10-CM

## 2018-11-01 DIAGNOSIS — Z3A25 25 weeks gestation of pregnancy: Secondary | ICD-10-CM

## 2018-11-01 DIAGNOSIS — F319 Bipolar disorder, unspecified: Secondary | ICD-10-CM

## 2018-11-01 DIAGNOSIS — G40909 Epilepsy, unspecified, not intractable, without status epilepticus: Secondary | ICD-10-CM

## 2018-11-01 DIAGNOSIS — O99352 Diseases of the nervous system complicating pregnancy, second trimester: Secondary | ICD-10-CM

## 2018-11-15 ENCOUNTER — Encounter (HOSPITAL_COMMUNITY): Payer: Self-pay | Admitting: *Deleted

## 2018-11-15 ENCOUNTER — Ambulatory Visit (HOSPITAL_COMMUNITY): Payer: Medicaid Other | Admitting: *Deleted

## 2018-11-15 ENCOUNTER — Ambulatory Visit (HOSPITAL_COMMUNITY)
Admission: RE | Admit: 2018-11-15 | Discharge: 2018-11-15 | Disposition: A | Discharge status: Home / Self Care | Payer: Medicaid Other | Source: Ambulatory Visit | Attending: Obstetrics and Gynecology | Admitting: Obstetrics and Gynecology

## 2018-11-15 ENCOUNTER — Other Ambulatory Visit (HOSPITAL_COMMUNITY): Payer: Self-pay | Admitting: *Deleted

## 2018-11-15 ENCOUNTER — Telehealth: Payer: Self-pay | Admitting: Family Medicine

## 2018-11-15 ENCOUNTER — Other Ambulatory Visit: Payer: Self-pay

## 2018-11-15 DIAGNOSIS — O09292 Supervision of pregnancy with other poor reproductive or obstetric history, second trimester: Secondary | ICD-10-CM

## 2018-11-15 DIAGNOSIS — O365990 Maternal care for other known or suspected poor fetal growth, unspecified trimester, not applicable or unspecified: Secondary | ICD-10-CM | POA: Diagnosis present

## 2018-11-15 DIAGNOSIS — Z362 Encounter for other antenatal screening follow-up: Secondary | ICD-10-CM | POA: Diagnosis not present

## 2018-11-15 DIAGNOSIS — O360190 Maternal care for anti-D [Rh] antibodies, unspecified trimester, not applicable or unspecified: Secondary | ICD-10-CM

## 2018-11-15 DIAGNOSIS — Z6791 Unspecified blood type, Rh negative: Secondary | ICD-10-CM

## 2018-11-15 DIAGNOSIS — O365920 Maternal care for other known or suspected poor fetal growth, second trimester, not applicable or unspecified: Secondary | ICD-10-CM | POA: Diagnosis present

## 2018-11-15 DIAGNOSIS — O365921 Maternal care for other known or suspected poor fetal growth, second trimester, fetus 1: Secondary | ICD-10-CM | POA: Diagnosis not present

## 2018-11-15 DIAGNOSIS — F319 Bipolar disorder, unspecified: Secondary | ICD-10-CM

## 2018-11-15 DIAGNOSIS — Z3A27 27 weeks gestation of pregnancy: Secondary | ICD-10-CM

## 2018-11-15 DIAGNOSIS — O99352 Diseases of the nervous system complicating pregnancy, second trimester: Secondary | ICD-10-CM

## 2018-11-15 DIAGNOSIS — O26899 Other specified pregnancy related conditions, unspecified trimester: Secondary | ICD-10-CM | POA: Diagnosis present

## 2018-11-15 DIAGNOSIS — G40909 Epilepsy, unspecified, not intractable, without status epilepticus: Secondary | ICD-10-CM

## 2018-11-15 DIAGNOSIS — O99342 Other mental disorders complicating pregnancy, second trimester: Secondary | ICD-10-CM | POA: Diagnosis not present

## 2018-11-15 NOTE — Telephone Encounter (Signed)
Patient came over form MFM w/ a sticky note about getting appointments scheduled. Patient was given several days to come in for her OB visits but stated that she could nto do any of them due to child care. The days that the patient had u/s appointments we did not have availability. Patient was offered an afternoon appointment on the day she has a morning u/s appointment and she declined. Patient stated that she would have to call us back to be scheduled for her OB appointments.

## 2018-11-15 NOTE — Progress Notes (Signed)
Patient states she has not been to her prenatal appts in a while. States her "mental health is not good right now." States she is bipolar and has been off her meds and it is hard for her to leave her house. States she has 3 kids and needs to schedule her MFM and prenatal appts on the same day. States she has no desire to harm her children. States "I want to die, but I don't want to die." States she knows she needs to be there for her children. Spoke with Edyth Gunnels in [place] office who wrote a note for patient at checkout to schedule appts together, to see MD and Roselyn Reef ASAP.

## 2018-11-16 ENCOUNTER — Encounter: Payer: Self-pay | Admitting: *Deleted

## 2018-11-17 ENCOUNTER — Inpatient Hospital Stay (HOSPITAL_COMMUNITY)
Admission: AD | Admit: 2018-11-17 | Discharge: 2018-11-18 | DRG: 833 | Discharge status: Against Medical Advice | Payer: Medicaid Other | Attending: Obstetrics and Gynecology | Admitting: Obstetrics and Gynecology

## 2018-11-17 ENCOUNTER — Encounter (HOSPITAL_COMMUNITY): Payer: Self-pay | Admitting: *Deleted

## 2018-11-17 ENCOUNTER — Inpatient Hospital Stay (HOSPITAL_COMMUNITY): Payer: Medicaid Other

## 2018-11-17 ENCOUNTER — Other Ambulatory Visit: Payer: Self-pay

## 2018-11-17 DIAGNOSIS — Z87440 Personal history of urinary (tract) infections: Secondary | ICD-10-CM | POA: Diagnosis not present

## 2018-11-17 DIAGNOSIS — M549 Dorsalgia, unspecified: Secondary | ICD-10-CM | POA: Diagnosis present

## 2018-11-17 DIAGNOSIS — O99353 Diseases of the nervous system complicating pregnancy, third trimester: Secondary | ICD-10-CM

## 2018-11-17 DIAGNOSIS — Z8249 Family history of ischemic heart disease and other diseases of the circulatory system: Secondary | ICD-10-CM | POA: Diagnosis not present

## 2018-11-17 DIAGNOSIS — R109 Unspecified abdominal pain: Secondary | ICD-10-CM | POA: Diagnosis present

## 2018-11-17 DIAGNOSIS — Z8349 Family history of other endocrine, nutritional and metabolic diseases: Secondary | ICD-10-CM

## 2018-11-17 DIAGNOSIS — O99332 Smoking (tobacco) complicating pregnancy, second trimester: Secondary | ICD-10-CM | POA: Diagnosis present

## 2018-11-17 DIAGNOSIS — R590 Localized enlarged lymph nodes: Secondary | ICD-10-CM | POA: Diagnosis present

## 2018-11-17 DIAGNOSIS — G40909 Epilepsy, unspecified, not intractable, without status epilepticus: Secondary | ICD-10-CM | POA: Diagnosis present

## 2018-11-17 DIAGNOSIS — Z9189 Other specified personal risk factors, not elsewhere classified: Secondary | ICD-10-CM

## 2018-11-17 DIAGNOSIS — O26892 Other specified pregnancy related conditions, second trimester: Secondary | ICD-10-CM | POA: Diagnosis present

## 2018-11-17 DIAGNOSIS — O2300 Infections of kidney in pregnancy, unspecified trimester: Secondary | ICD-10-CM | POA: Diagnosis present

## 2018-11-17 DIAGNOSIS — O26893 Other specified pregnancy related conditions, third trimester: Secondary | ICD-10-CM

## 2018-11-17 DIAGNOSIS — O368120 Decreased fetal movements, second trimester, not applicable or unspecified: Secondary | ICD-10-CM | POA: Diagnosis present

## 2018-11-17 DIAGNOSIS — F17210 Nicotine dependence, cigarettes, uncomplicated: Secondary | ICD-10-CM | POA: Diagnosis present

## 2018-11-17 DIAGNOSIS — Z833 Family history of diabetes mellitus: Secondary | ICD-10-CM | POA: Diagnosis not present

## 2018-11-17 DIAGNOSIS — O99891 Other specified diseases and conditions complicating pregnancy: Secondary | ICD-10-CM | POA: Diagnosis not present

## 2018-11-17 DIAGNOSIS — R102 Pelvic and perineal pain: Secondary | ICD-10-CM | POA: Diagnosis present

## 2018-11-17 DIAGNOSIS — O2303 Infections of kidney in pregnancy, third trimester: Secondary | ICD-10-CM

## 2018-11-17 DIAGNOSIS — O99352 Diseases of the nervous system complicating pregnancy, second trimester: Secondary | ICD-10-CM | POA: Diagnosis present

## 2018-11-17 DIAGNOSIS — Z3A27 27 weeks gestation of pregnancy: Secondary | ICD-10-CM | POA: Diagnosis not present

## 2018-11-17 DIAGNOSIS — M546 Pain in thoracic spine: Secondary | ICD-10-CM | POA: Diagnosis present

## 2018-11-17 DIAGNOSIS — Z8759 Personal history of other complications of pregnancy, childbirth and the puerperium: Secondary | ICD-10-CM

## 2018-11-17 LAB — URINALYSIS, ROUTINE W REFLEX MICROSCOPIC
Bilirubin Urine: NEGATIVE
Glucose, UA: NEGATIVE mg/dL
Hgb urine dipstick: NEGATIVE
Ketones, ur: 5 mg/dL — AB
Nitrite: NEGATIVE
Protein, ur: NEGATIVE mg/dL
Specific Gravity, Urine: 1.015 (ref 1.005–1.030)
pH: 6 (ref 5.0–8.0)

## 2018-11-17 LAB — CBC WITH DIFFERENTIAL/PLATELET
Abs Immature Granulocytes: 0.08 10*3/uL — ABNORMAL HIGH (ref 0.00–0.07)
Basophils Absolute: 0.1 10*3/uL (ref 0.0–0.1)
Basophils Relative: 1 %
Eosinophils Absolute: 0.1 10*3/uL (ref 0.0–0.5)
Eosinophils Relative: 0 %
HCT: 31.6 % — ABNORMAL LOW (ref 36.0–46.0)
Hemoglobin: 10.7 g/dL — ABNORMAL LOW (ref 12.0–15.0)
Immature Granulocytes: 1 %
Lymphocytes Relative: 12 %
Lymphs Abs: 1.7 10*3/uL (ref 0.7–4.0)
MCH: 30.5 pg (ref 26.0–34.0)
MCHC: 33.9 g/dL (ref 30.0–36.0)
MCV: 90 fL (ref 80.0–100.0)
Monocytes Absolute: 1.1 10*3/uL — ABNORMAL HIGH (ref 0.1–1.0)
Monocytes Relative: 8 %
Neutro Abs: 11.1 10*3/uL — ABNORMAL HIGH (ref 1.7–7.7)
Neutrophils Relative %: 78 %
Platelets: 245 10*3/uL (ref 150–400)
RBC: 3.51 MIL/uL — ABNORMAL LOW (ref 3.87–5.11)
RDW: 13.8 % (ref 11.5–15.5)
WBC: 14.1 10*3/uL — ABNORMAL HIGH (ref 4.0–10.5)
nRBC: 0 % (ref 0.0–0.2)

## 2018-11-17 LAB — CBC
HCT: 32 % — ABNORMAL LOW (ref 36.0–46.0)
Hemoglobin: 10.6 g/dL — ABNORMAL LOW (ref 12.0–15.0)
MCH: 30 pg (ref 26.0–34.0)
MCHC: 33.1 g/dL (ref 30.0–36.0)
MCV: 90.7 fL (ref 80.0–100.0)
Platelets: 260 10*3/uL (ref 150–400)
RBC: 3.53 MIL/uL — ABNORMAL LOW (ref 3.87–5.11)
RDW: 13.8 % (ref 11.5–15.5)
WBC: 14.4 10*3/uL — ABNORMAL HIGH (ref 4.0–10.5)
nRBC: 0 % (ref 0.0–0.2)

## 2018-11-17 LAB — WET PREP, GENITAL
Sperm: NONE SEEN
Trich, Wet Prep: NONE SEEN
Yeast Wet Prep HPF POC: NONE SEEN

## 2018-11-17 MED ORDER — FOLIC ACID 1 MG PO TABS: 1.0000 mg | ORAL_TABLET | Freq: Every day | ORAL | 5 refills | Status: DC

## 2018-11-17 MED ORDER — LEVETIRACETAM 1000 MG PO TABS: 1000.0000 mg | ORAL_TABLET | Freq: Two times a day (BID) | ORAL | 5 refills | Status: DC

## 2018-11-17 MED ORDER — PRENATAL MULTIVITAMIN CH: 1.0000 | ORAL_TABLET | Freq: Every day | ORAL | Status: DC

## 2018-11-17 MED ORDER — CEPHALEXIN 500 MG PO CAPS: 500.0000 mg | ORAL_CAPSULE | Freq: Four times a day (QID) | ORAL | 0 refills | Status: DC

## 2018-11-17 MED ORDER — SODIUM CHLORIDE 0.9 % IV SOLN
INTRAVENOUS | Status: DC | PRN
  Administered 2018-11-17: 1000 mL via INTRAVENOUS

## 2018-11-17 MED ORDER — DOCUSATE SODIUM 100 MG PO CAPS: 100.0000 mg | ORAL_CAPSULE | Freq: Every day | ORAL | Status: DC

## 2018-11-17 MED ORDER — ACETAMINOPHEN 500 MG PO TABS
1000.0000 mg | ORAL_TABLET | Freq: Once | ORAL | Status: AC
  Administered 2018-11-17: 1000 mg via ORAL
  Filled 2018-11-17: qty 2

## 2018-11-17 MED ORDER — SODIUM CHLORIDE 0.9 % IV SOLN
2.0000 g | INTRAVENOUS | Status: DC
  Administered 2018-11-17: 2 g via INTRAVENOUS
  Filled 2018-11-17: qty 2

## 2018-11-17 MED ORDER — ACETAMINOPHEN 325 MG PO TABS: 650.0000 mg | ORAL_TABLET | ORAL | Status: DC | PRN

## 2018-11-17 MED ORDER — CALCIUM CARBONATE ANTACID 500 MG PO CHEW: 2.0000 | CHEWABLE_TABLET | ORAL | Status: DC | PRN

## 2018-11-17 MED ORDER — ASPIRIN EC 81 MG PO TBEC: 81.0000 mg | DELAYED_RELEASE_TABLET | Freq: Every day | ORAL | Status: DC

## 2018-11-17 MED ORDER — LACTATED RINGERS IV SOLN: INTRAVENOUS | Status: DC

## 2018-11-17 NOTE — MAU Provider Note (Addendum)
History     CSN: [id number]  Arrival date and time: 11/17/18 1718   First Provider Initiated Contact with Patient 11/17/18 1813      Chief Complaint  Patient presents with  . Decreased Fetal Movement   HPI Renee Velez is a 23 y.o. [id number] at 2013w6d who presents today with decreased fetal movement following a seizure last night around 2130. Reports 5-6 fetal movements since seizure. Pt has a hx of epilepsy but does not take any medications due to side effects. She has previous been on Keppra for many years without issues and about 4 years ago was switched to Lamictal She reports she does not have a neurologist here to help manage her care. Last seizure was 6 months ago.  She also reports left flank pain that radiates to abdomen. States she had some similar pains in her previous pregnancy that ended up being pyelonephritis. Reports urgency, but denies dysuria, frequency or hematuria.  There is an associated painful left inguinal lymph node. It started this morning, and has worsened to the point where it hurts to walk. She has tried heating pads without improvement. Reports it is very tender to palpation. States during prior pyelonephritis and prior Chlamydia infection she also had a swollen inguinal lymph node.   Lastly complains of "pressure in her bottom" for the past 3 days. The pressure is intermittent and she describes as someone is "pushing on her buttock".   Reports N/V during pregnancy unchanged in recent weeks. Denies fever, chills or fatigue Denies vaginal bleeding, vaginal discharge, loss of fluids.   OB History    Gravida  5   Para  3   Term  3   Preterm      AB  1   Living  3     SAB  1   TAB  0   Ectopic  0   Multiple  0   Live Births  3           Past Medical History:  Diagnosis Date  . Anxiety   . Chlamydia   . Epilepsy (HCC)   . Gonorrhea   . Post partum depression 2018  . Seizure [facility](HCC)     Past Surgical History:  Procedure  Laterality Date  . NO PAST SURGERIES      Family History  Problem Relation Age of Onset  . Hypertension Father   . Cancer Maternal Grandmother   . Cancer Paternal Grandmother   . Diabetes Paternal Grandfather   . Thyroid disease Sister   . ADD / ADHD Brother     Social History   Tobacco Use  . Smoking status: Current Every Day Smoker    Packs/day: 0.25    Types: Cigarettes  . Smokeless tobacco: Never Used  Substance Use Topics  . Alcohol use: Not Currently  . Drug use: No    Allergies:  Allergies  Allergen Reactions  . Nickel Rash    Medications Prior to Admission  Medication Sig Dispense Refill Last Dose  . aspirin EC 81 MG tablet Take 1 tablet (81 mg total) by mouth daily. Take after 12 weeks for prevention of preeclampsia later in pregnancy 300 tablet 2 11/17/2018 at Unknown time  . Prenatal Vit-Fe Fumarate-FA (PRENATAL MULTIVITAMIN) TABS tablet Take 1 tablet by mouth daily at 12 noon.   11/17/2018 at Unknown time  . Doxylamine-Pyridoxine 10-10 MG TBEC Take 1 tablet by mouth every 8 (eight) hours as needed (nausea). (Patient not taking: Reported on 07/13/2018) 20  tablet 0   . folic acid (FOLVITE) 1 MG tablet Take 1 tablet (1 mg total) by mouth daily. (Patient not taking: Reported on 07/27/2018) 30 tablet 10   . levETIRAcetam (KEPPRA) 1000 MG tablet Take 1 tablet (1,000 mg total) by mouth 2 (two) times daily for 30 days. (Patient not taking: Reported on 07/13/2018) 60 tablet 0     Review of Systems  Constitutional: Negative for chills, fatigue and fever.  Gastrointestinal: Positive for nausea and vomiting. Negative for abdominal pain, constipation and diarrhea.  Genitourinary: Positive for flank pain and frequency. Negative for dysuria and hematuria.  Neurological: Positive for seizures. Negative for weakness and numbness.   Physical Exam   Blood pressure (!) 117/59, pulse (!) 108, temperature 98.1 F (36.7 C), resp. rate 16, last menstrual period 04/26/2018, SpO2 96  %, unknown if currently breastfeeding.  Physical Exam  Constitutional: She is oriented to person, place, and time. She appears well-developed and well-nourished.  HENT:  Head: Normocephalic.  Eyes: Conjunctivae are normal.  Neck: Normal range of motion.  Cardiovascular: Normal rate.  Respiratory: Effort normal.  GI: There is CVA tenderness.  Genitourinary:    Vagina normal.   Lymphadenopathy:       Left: Inguinal adenopathy present.  Very tender to palpation  Neurological: She is alert and oriented to person, place, and time.  Skin: Skin is warm and dry.  Psychiatric: She has a normal mood and affect.     MAU Course  Procedures Orders Placed This Encounter  Procedures  . OB Urine Culture  . Wet prep, genital  . US Renal  . Urinalysis, Routine w reflex microscopic  . CBC    MDM - given pelvic pain work up for PROM - CBC and UA with culture performed to r/o pyelonephritis vs UTI - GC/C and wet prep performed - sent for renal US to r/o stone causing flank pain  NST: baseline 135, appropriate for gestational age with 10x10 accelerations  A: 1. Acute left flank pain   2. Back pain affecting pregnancy in third trimester   3. Pelvic pain affecting pregnancy in third trimester, antepartum   4. Lymphadenopathy, inguinal   5. Epilepsy affecting pregnancy in third trimester [facility])     Plan - Previous treatment with Keppra without adverse side effects. Will restart patient on Keppra and provide referral to establish with neurology in the area - Pt also showed interest in behavorial health care and will set up with behavorial health specialist in office - UA with large leukocytes, culture pending - CBC - wet prep + for clue cells and WBC  Alexa A Kimker 11/17/2018, 6:14 PM   I confirm that I have verified the information documented in the physician assistant student's note and that I have also personally performed the history, physical exam and all medical decision making  activities of this service and have verified that all service and findings are accurately documented in this student's note.    Report to Darrol Poke, CNM, with renal US and CBC pending.  Message sent to [facility] and referral placed for neurology and integrated behavioral health.    Elvera Maria, CNM 11/17/2018 8:34 PM   Labs and [place] report reviewed:  Results for orders placed or performed during the hospital encounter of 11/17/18 (from the past 24 hour(s))  Urinalysis, Routine w reflex microscopic     Status: Abnormal   Collection Time: 11/17/18  5:27 PM  Result Value Ref Range   Color, Urine YELLOW  YELLOW   APPearance CLOUDY (A) CLEAR   Specific Gravity, Urine 1.015 1.005 - 1.030   pH 6.0 5.0 - 8.0   Glucose, UA NEGATIVE NEGATIVE mg/dL   Hgb urine dipstick NEGATIVE NEGATIVE   Bilirubin Urine NEGATIVE NEGATIVE   Ketones, ur 5 (A) NEGATIVE mg/dL   Protein, ur NEGATIVE NEGATIVE mg/dL   Nitrite NEGATIVE NEGATIVE   Leukocytes,Ua LARGE (A) NEGATIVE   RBC / HPF 0-5 0 - 5 RBC/hpf   WBC, UA 11-20 0 - 5 WBC/hpf   Bacteria, UA FEW (A) NONE SEEN   Squamous Epithelial / LPF 21-50 0 - 5   Mucus PRESENT   Wet prep, genital     Status: Abnormal   Collection Time: 11/17/18  7:00 PM   Specimen: PATH Cytology Cervicovaginal Ancillary Only; Genital  Result Value Ref Range   Yeast Wet Prep HPF POC NONE SEEN NONE SEEN   Trich, Wet Prep NONE SEEN NONE SEEN   Clue Cells Wet Prep HPF POC PRESENT (A) NONE SEEN   WBC, Wet Prep HPF POC MANY (A) NONE SEEN   Sperm NONE SEEN   CBC     Status: Abnormal   Collection Time: 11/17/18  7:36 PM  Result Value Ref Range   WBC 14.4 (H) 4.0 - 10.5 K/uL   RBC 3.53 (L) 3.87 - 5.11 MIL/uL   Hemoglobin 10.6 (L) 12.0 - 15.0 g/dL   HCT 40.9 (L) 81.1 - 91.4 %   MCV 90.7 80.0 - 100.0 fL   MCH 30.0 26.0 - 34.0 pg   MCHC 33.1 30.0 - 36.0 g/dL   RDW 78.2 95.6 - 21.3 %   Platelets 260 150 - 400 K/uL   nRBC 0.0 0.0 - 0.2 %  CBC with Differential/Platelet      Status: Abnormal   Collection Time: 11/17/18  7:36 PM  Result Value Ref Range   WBC 14.1 (H) 4.0 - 10.5 K/uL   RBC 3.51 (L) 3.87 - 5.11 MIL/uL   Hemoglobin 10.7 (L) 12.0 - 15.0 g/dL   HCT 08.6 (L) 57.8 - 46.9 %   MCV 90.0 80.0 - 100.0 fL   MCH 30.5 26.0 - 34.0 pg   MCHC 33.9 30.0 - 36.0 g/dL   RDW 62.9 52.8 - 41.3 %   Platelets 245 150 - 400 K/uL   nRBC 0.0 0.0 - 0.2 %   Neutrophils Relative % 78 %   Neutro Abs 11.1 (H) 1.7 - 7.7 K/uL   Lymphocytes Relative 12 %   Lymphs Abs 1.7 0.7 - 4.0 K/uL   Monocytes Relative 8 %   Monocytes Absolute 1.1 (H) 0.1 - 1.0 K/uL   Eosinophils Relative 0 %   Eosinophils Absolute 0.1 0.0 - 0.5 K/uL   Basophils Relative 1 %   Basophils Absolute 0.1 0.0 - 0.1 K/uL   Immature Granulocytes 1 %   Abs Immature Granulocytes 0.08 (H) 0.00 - 0.07 K/uL   US Renal  Result Date: 11/17/2018 CLINICAL DATA:  Left flank pain for 1 day.  [redacted] weeks pregnant. EXAM: RENAL / URINARY TRACT ULTRASOUND COMPLETE COMPARISON:  None. FINDINGS: Right Kidney: Renal measurements: 11.6 x 4.7 x 6.1 cm = volume: 172 mL . Echogenicity within normal limits. No mass identified. Mild right hydronephrosis noted. Left Kidney: Renal measurements: 11.2 x 4.5 x 4.7 cm = volume: 126 mL. Echogenicity within normal limits. No mass or hydronephrosis visualized. Bladder: Appears normal for degree of bladder distention. Other: Intrauterine fetus noted in cephalic presentation. IMPRESSION: No evidence of  left-sided hydronephrosis. Mild right hydronephrosis. Etiology is not visualized by ultrasound. This may represent physiologic hydronephrosis of pregnancy, although a ureteral calculus cannot be excluded by ultrasound. Electronically Signed   By: Danae Orleans M.D.   On: 11/17/2018 20:12   Positive left shift on CBC with Diff plus CVA tenderness and flank pain based on physical examination  Hx of pyelonephritis in previous pregnancy. Based on lab work and examination diagnosed with pyelo this  pregnancy.  Consult with Dr Jolayne Panther who recommends admission to [facility] speciality care and treatment.   Patient adamant that she can not stay for treatment. She reports husband is on last strike at work and if he misses work in the morning then will lose his job. She reports needing to be at home in the morning to take care of the children. Educated and discussed importance of adequate treatment. Patient tearful in room and "does not want to be complicated but can not stay"   Discussed options of signing AMA and receiving Rx for Keflex, signing AMA receiving IV dose of Rocephin and Rx for Keflex or staying for complete impatient treatment. Patient agrees to receiving IV dose then going home with Keflex.  Consult with Dr Jolayne Panther to agree with plan of care  IV dose of rochephin given to patient prior to discharge home. Discussed reasons to return to MAU immediately for evaluation. Follow up as scheduled for prenatal appointments.   Meds ordered this encounter  Medications  . levETIRAcetam (KEPPRA) 1000 MG tablet    Sig: Take 1 tablet (1,000 mg total) by mouth 2 (two) times daily.    Dispense:  60 tablet    Refill:  5    Order Specific Question:   Supervising Provider    Answer:   ERVIN, MICHAEL L [[id number]]  . folic acid (FOLVITE) 1 MG tablet    Sig: Take 1 tablet (1 mg total) by mouth daily.    Dispense:  30 tablet    Refill:  5    Order Specific Question:   Supervising Provider    Answer:   ERVIN, MICHAEL L [[id number]]  . cefTRIAXone (ROCEPHIN) 2 g in sodium chloride 0.9 % 100 mL IVPB    Order Specific Question:   Antibiotic Indication:    Answer:   Other Indication (list below)    Order Specific Question:   Other Indication:    Answer:   Pyelonephritis in pregnancy  . acetaminophen (TYLENOL) tablet 1,000 mg  . cephALEXin (KEFLEX) 500 MG capsule    Sig: Take 1 capsule (500 mg total) by mouth 4 (four) times daily.    Dispense:  38 capsule    Refill:  0    Order Specific Question:   Supervising  Provider    Answer:   Tilda Burrow [[id number]]  . 0.9 %  sodium chloride infusion    Carrier Fluid Protocol   Rx for keppra, keflex, and folvite sent to pharmacy of choice. Patient signed AMA papers and agrees to leaving against medical advice and not staying for full treatment.   Pt stable prior to leaving MAU AMA   Sharyon Cable, CNM 11/18/18, 2:15 AM

## 2018-11-17 NOTE — MAU Note (Signed)
.   Renee Velez is a 23 y.o. at [redacted]w[redacted]d here in MAU reporting: that she has a history of seizures, does not take her medication. States she had a seizure last night around 2130 and has pelvic pain, back pain and a decrease in fetal movement since the seizure LMP:  Onset of complaint: last night at 2130 Pain score: 7 Vitals:   11/17/18 1735  BP: (!) 117/59  Pulse: (!) 108  Resp: 16  Temp: 98.1 F (36.7 C)  SpO2: 96%     FHT:142 Lab orders placed from triage: UA

## 2018-11-18 LAB — CULTURE, OB URINE

## 2018-11-18 LAB — CMV ANTIBODY, IGG (EIA): CMV Ab - IgG: <0.6 U/mL (ref 0.00–0.59)

## 2018-11-18 LAB — MATERNIT 21 PLUS CORE, BLOOD
Fetal Fraction: 11
Result (T21): NEGATIVE
Trisomy 13 (Patau syndrome): NEGATIVE
Trisomy 18 (Edwards syndrome): NEGATIVE
Trisomy 21 (Down syndrome): NEGATIVE

## 2018-11-18 LAB — INFECT DISEASE AB IGM REFLEX 1

## 2018-11-18 LAB — PARVOVIRUS B19 ANTIBODY, IGG AND IGM
Parvovirus B19 IgG: 1.3 index — ABNORMAL HIGH (ref 0.0–0.8)
Parvovirus B19 IgM: 0.1 index (ref 0.0–0.8)

## 2018-11-18 LAB — CYTOMEGALOVIRUS IGG AB AVIDITY

## 2018-11-18 LAB — TOXOPLASMA GONDII ANTIBODY, IGM: Toxoplasma Antibody- IgM: 4.1 AU/mL (ref 0.0–7.9)

## 2018-11-18 LAB — TOXOPLASMA GONDII ANTIBODY, IGG: Toxoplasma IgG Ratio: <3 IU/mL (ref 0.0–7.1)

## 2018-11-18 LAB — CMV IGM: CMV IgM Ser EIA-aCnc: <30 AU/mL (ref 0.0–29.9)

## 2018-11-18 NOTE — BH Specialist Note (Signed)
Pt did not arrive to video visit and did not answer the phone; phone message "call cannot be completed at this time"; left MyChart message for patient.   [place] via Telemedicine Video Visit  11/18/2018 Renee Velez [id number]  [name]

## 2018-11-21 ENCOUNTER — Telehealth: Payer: Self-pay | Admitting: Obstetrics & Gynecology

## 2018-11-21 NOTE — Telephone Encounter (Signed)
Called the patient to inform of the upcoming appointment. Received a message we're sorry, your call can not be completed at this time. Please hang up and try your call again later. °

## 2018-11-22 ENCOUNTER — Ambulatory Visit (HOSPITAL_COMMUNITY): Payer: Medicaid Other | Admitting: *Deleted

## 2018-11-22 ENCOUNTER — Other Ambulatory Visit: Payer: Self-pay

## 2018-11-22 ENCOUNTER — Ambulatory Visit: Payer: Medicaid Other | Admitting: Clinical

## 2018-11-22 ENCOUNTER — Encounter: Payer: Self-pay | Admitting: Obstetrics and Gynecology

## 2018-11-22 ENCOUNTER — Ambulatory Visit (HOSPITAL_COMMUNITY)
Admission: RE | Admit: 2018-11-22 | Discharge: 2018-11-22 | Disposition: A | Discharge status: Home / Self Care | Payer: Medicaid Other | Source: Ambulatory Visit | Attending: Obstetrics | Admitting: Obstetrics

## 2018-11-22 ENCOUNTER — Encounter (HOSPITAL_COMMUNITY): Payer: Self-pay

## 2018-11-22 ENCOUNTER — Ambulatory Visit (INDEPENDENT_AMBULATORY_CARE_PROVIDER_SITE_OTHER): Payer: Medicaid Other | Admitting: Obstetrics and Gynecology

## 2018-11-22 VITALS — BP 108/54 | HR 67 | Wt 138.0 lb

## 2018-11-22 DIAGNOSIS — Z23 Encounter for immunization: Secondary | ICD-10-CM | POA: Diagnosis not present

## 2018-11-22 DIAGNOSIS — O26899 Other specified pregnancy related conditions, unspecified trimester: Secondary | ICD-10-CM

## 2018-11-22 DIAGNOSIS — O99353 Diseases of the nervous system complicating pregnancy, third trimester: Secondary | ICD-10-CM

## 2018-11-22 DIAGNOSIS — Z3A28 28 weeks gestation of pregnancy: Secondary | ICD-10-CM

## 2018-11-22 DIAGNOSIS — F319 Bipolar disorder, unspecified: Secondary | ICD-10-CM

## 2018-11-22 DIAGNOSIS — O0993 Supervision of high risk pregnancy, unspecified, third trimester: Secondary | ICD-10-CM

## 2018-11-22 DIAGNOSIS — Z5329 Procedure and treatment not carried out because of patient's decision for other reasons: Secondary | ICD-10-CM

## 2018-11-22 DIAGNOSIS — O360130 Maternal care for anti-D [Rh] antibodies, third trimester, not applicable or unspecified: Secondary | ICD-10-CM | POA: Diagnosis not present

## 2018-11-22 DIAGNOSIS — O2303 Infections of kidney in pregnancy, third trimester: Secondary | ICD-10-CM | POA: Diagnosis not present

## 2018-11-22 DIAGNOSIS — O26893 Other specified pregnancy related conditions, third trimester: Secondary | ICD-10-CM

## 2018-11-22 DIAGNOSIS — Z91199 Patient's noncompliance with other medical treatment and regimen due to unspecified reason: Secondary | ICD-10-CM

## 2018-11-22 DIAGNOSIS — Z6791 Unspecified blood type, Rh negative: Secondary | ICD-10-CM

## 2018-11-22 DIAGNOSIS — O365931 Maternal care for other known or suspected poor fetal growth, third trimester, fetus 1: Secondary | ICD-10-CM

## 2018-11-22 DIAGNOSIS — G40909 Epilepsy, unspecified, not intractable, without status epilepticus: Secondary | ICD-10-CM

## 2018-11-22 DIAGNOSIS — O365990 Maternal care for other known or suspected poor fetal growth, unspecified trimester, not applicable or unspecified: Secondary | ICD-10-CM | POA: Insufficient documentation

## 2018-11-22 DIAGNOSIS — Z8759 Personal history of other complications of pregnancy, childbirth and the puerperium: Secondary | ICD-10-CM

## 2018-11-22 DIAGNOSIS — O09293 Supervision of pregnancy with other poor reproductive or obstetric history, third trimester: Secondary | ICD-10-CM

## 2018-11-22 DIAGNOSIS — O365930 Maternal care for other known or suspected poor fetal growth, third trimester, not applicable or unspecified: Secondary | ICD-10-CM | POA: Diagnosis not present

## 2018-11-22 DIAGNOSIS — O99343 Other mental disorders complicating pregnancy, third trimester: Secondary | ICD-10-CM

## 2018-11-22 DIAGNOSIS — O0990 Supervision of high risk pregnancy, unspecified, unspecified trimester: Secondary | ICD-10-CM

## 2018-11-22 DIAGNOSIS — O2300 Infections of kidney in pregnancy, unspecified trimester: Secondary | ICD-10-CM

## 2018-11-22 LAB — GC/CHLAMYDIA PROBE AMP (~~LOC~~) NOT AT ARMC
Chlamydia: NEGATIVE
Comment: NEGATIVE
Comment: NORMAL
Neisseria Gonorrhea: NEGATIVE

## 2018-11-22 MED ORDER — RHO D IMMUNE GLOBULIN 1500 UNIT/2ML IJ SOSY
300.0000 ug | PREFILLED_SYRINGE | Freq: Once | INTRAMUSCULAR | Status: AC
  Administered 2018-11-22: 300 ug via INTRAMUSCULAR

## 2018-11-22 NOTE — Progress Notes (Signed)
PRENATAL VISIT NOTE  Subjective:  Renee Velez is a 23 y.o. [id number] at [redacted]w[redacted]d being seen today for ongoing prenatal care.  She is currently monitored for the following issues for this high-risk pregnancy and has Seizure disorder ([place]); Bipolar 1 disorder ([place]); Tobacco use disorder; History of gestational hypertension; Nausea and vomiting during pregnancy; Supervision of high risk pregnancy, antepartum; Rh negative, antepartum; Pregnancy affected by fetal growth restriction; History of pyelonephritis during pregnancy; and Pyelonephritis affecting pregnancy, antepartum on their problem list.  Patient reports some pressure she thinks is due to her kidney infection, she is not currently taking her antibiotics yet. Has had difficult time with mental issues lately. Gets sad and cries, "can't do anything." Denies suicidal/homicidal ideation but reports she is having a rough time. Contractions: Irritability. Vag. Bleeding: None.  Movement: Present. Denies leaking of fluid.   The following portions of the patient's history were reviewed and updated as appropriate: allergies, current medications, past family history, past medical history, past social history, past surgical history and problem list.   Objective:   Vitals:   11/22/18 1136  BP: (!) 108/54  Pulse: 67  Weight: 138 lb (62.6 kg)    Fetal Status: Fetal Heart Rate (bpm): 174   Movement: Present     General:  Alert, oriented and cooperative. Patient is in no acute distress.  Skin: Skin is warm and dry. No rash noted.   Cardiovascular: Normal heart rate noted  Respiratory: Normal respiratory effort, no problems with respiration noted  Abdomen: Soft, gravid, appropriate for gestational age.  Pain/Pressure: Present     Pelvic: Cervical exam deferred        Extremities: Normal range of motion.  Edema: None  Mental Status: Normal mood and affect. Normal behavior. Normal judgment and thought content.   Assessment and Plan:  Pregnancy: [id number]  at [redacted]w[redacted]d  1. Supervision of high risk pregnancy, antepartum 2 hr GTT to be scheduled - CBC - RPR - HIV Antibody (routine testing w rflx)  2. Rh negative, antepartum Rho gam today Type & Screen  3. Pregnancy affected by fetal growth restriction Last growth 1% Normal dopplers today Has BPP weekly Reviewed likelihood of early delivery  4. History of gestational hypertension BP stable today  5. Pyelonephritis affecting pregnancy, antepartum Has not started antibiotics yet, encouraged her to start abx or infection will recur - to pick up keflex today  6. Seizure disorder ([place]) Seizure on 11/17/18, has not been on meds To start keppra, has not picked up at pharmacy today  7. Bipolar 1 disorder ([place]) - Reports she had a mental "breakdown" last week and her mental health issues and scheduling have been the reason she has been unable to make her OB appointments - Trying to keep herself as unstressed as possible - Currently living in a hotel - denies SI/HI - accepts referral    Preterm labor symptoms and general obstetric precautions including but not limited to vaginal bleeding, contractions, leaking of fluid and fetal movement were reviewed in detail with the patient. Please refer to After Visit Summary for other counseling recommendations.   Return in about 2 weeks (around 12/06/2018) for high OB, virtual.  Future Appointments  Date Time Provider [place]  11/22/2018  2:15 PM Natoma Vining  11/28/2018  9:15 AM La Liga McKittrick MFC-US  11/28/2018  9:15 AM WH-MFC [place] 4 WH-MFCUS MFC-US  11/28/2018 10:55 AM Emily Filbert, MD WOC-WOCA WOC  12/05/2018  9:15 AM WH-MFC NURSE WH-MFC MFC-US  12/05/2018  9:15 AM WH-MFC [place] 4 WH-MFCUS MFC-US  12/05/2018 10:15 AM Levie Heritage, DO WOC-WOCA WOC  12/12/2018 10:55 AM Adrian Blackwater Rhona Raider, DO WOC-WOCA WOC  12/12/2018 11:30 AM WH-MFC NURSE WH-MFC MFC-US  12/12/2018 11:30 AM WH-MFC [place] 1 WH-MFCUS MFC-US     Conan Bowens, MD

## 2018-11-22 NOTE — Addendum Note (Signed)
Addended by: Louisa Second E on: 11/22/2018 12:22 PM   Modules accepted: Orders

## 2018-11-23 ENCOUNTER — Other Ambulatory Visit: Payer: Self-pay | Admitting: Lactation Services

## 2018-11-23 DIAGNOSIS — O0990 Supervision of high risk pregnancy, unspecified, unspecified trimester: Secondary | ICD-10-CM

## 2018-11-23 LAB — CBC
Hematocrit: 32.4 % — ABNORMAL LOW (ref 34.0–46.6)
Hemoglobin: 10.8 g/dL — ABNORMAL LOW (ref 11.1–15.9)
MCH: 29.8 pg (ref 26.6–33.0)
MCHC: 33.3 g/dL (ref 31.5–35.7)
MCV: 89 fL (ref 79–97)
Platelets: 280 10*3/uL (ref 150–450)
RBC: 3.63 x10E6/uL — ABNORMAL LOW (ref 3.77–5.28)
RDW: 13.5 % (ref 11.7–15.4)
WBC: 10.8 10*3/uL (ref 3.4–10.8)

## 2018-11-23 LAB — RPR: RPR Ser Ql: NONREACTIVE

## 2018-11-23 LAB — HIV ANTIBODY (ROUTINE TESTING W REFLEX): HIV Screen 4th Generation wRfx: NONREACTIVE

## 2018-11-28 ENCOUNTER — Other Ambulatory Visit: Payer: Self-pay

## 2018-11-28 ENCOUNTER — Ambulatory Visit (HOSPITAL_COMMUNITY): Payer: Medicaid Other | Admitting: *Deleted

## 2018-11-28 ENCOUNTER — Other Ambulatory Visit (HOSPITAL_COMMUNITY): Payer: Self-pay | Admitting: *Deleted

## 2018-11-28 ENCOUNTER — Other Ambulatory Visit: Payer: Medicaid Other

## 2018-11-28 ENCOUNTER — Encounter (HOSPITAL_COMMUNITY): Payer: Self-pay

## 2018-11-28 ENCOUNTER — Ambulatory Visit (INDEPENDENT_AMBULATORY_CARE_PROVIDER_SITE_OTHER): Payer: Medicaid Other | Admitting: Obstetrics & Gynecology

## 2018-11-28 ENCOUNTER — Ambulatory Visit (HOSPITAL_COMMUNITY)
Admission: RE | Admit: 2018-11-28 | Discharge: 2018-11-28 | Disposition: A | Discharge status: Home / Self Care | Payer: Medicaid Other | Source: Ambulatory Visit | Attending: Obstetrics | Admitting: Obstetrics

## 2018-11-28 VITALS — BP 122/58 | HR 67 | Wt 138.8 lb

## 2018-11-28 DIAGNOSIS — O365920 Maternal care for other known or suspected poor fetal growth, second trimester, not applicable or unspecified: Secondary | ICD-10-CM | POA: Diagnosis not present

## 2018-11-28 DIAGNOSIS — Z6791 Unspecified blood type, Rh negative: Secondary | ICD-10-CM | POA: Diagnosis present

## 2018-11-28 DIAGNOSIS — O99343 Other mental disorders complicating pregnancy, third trimester: Secondary | ICD-10-CM

## 2018-11-28 DIAGNOSIS — O26899 Other specified pregnancy related conditions, unspecified trimester: Secondary | ICD-10-CM

## 2018-11-28 DIAGNOSIS — Z3A29 29 weeks gestation of pregnancy: Secondary | ICD-10-CM

## 2018-11-28 DIAGNOSIS — O09293 Supervision of pregnancy with other poor reproductive or obstetric history, third trimester: Secondary | ICD-10-CM

## 2018-11-28 DIAGNOSIS — O0990 Supervision of high risk pregnancy, unspecified, unspecified trimester: Secondary | ICD-10-CM

## 2018-11-28 DIAGNOSIS — O365930 Maternal care for other known or suspected poor fetal growth, third trimester, not applicable or unspecified: Secondary | ICD-10-CM

## 2018-11-28 DIAGNOSIS — O360130 Maternal care for anti-D [Rh] antibodies, third trimester, not applicable or unspecified: Secondary | ICD-10-CM

## 2018-11-28 DIAGNOSIS — F319 Bipolar disorder, unspecified: Secondary | ICD-10-CM

## 2018-11-28 DIAGNOSIS — O365990 Maternal care for other known or suspected poor fetal growth, unspecified trimester, not applicable or unspecified: Secondary | ICD-10-CM

## 2018-11-28 DIAGNOSIS — G40909 Epilepsy, unspecified, not intractable, without status epilepticus: Secondary | ICD-10-CM

## 2018-11-28 DIAGNOSIS — O99353 Diseases of the nervous system complicating pregnancy, third trimester: Secondary | ICD-10-CM

## 2018-11-28 DIAGNOSIS — O0993 Supervision of high risk pregnancy, unspecified, third trimester: Secondary | ICD-10-CM | POA: Diagnosis present

## 2018-11-28 MED ORDER — BETAMETHASONE SOD PHOS & ACET 6 (3-3) MG/ML IJ SUSP
12.0000 mg | Freq: Once | INTRAMUSCULAR | Status: AC
  Administered 2018-11-28: 12 mg via INTRAMUSCULAR

## 2018-11-28 NOTE — Progress Notes (Addendum)
   PRENATAL VISIT NOTE  Subjective:  Renee Velez is a 23 y.o. [id number] at [redacted]w[redacted]d being seen today for ongoing prenatal care.  She is currently monitored for the following issues for this high-risk pregnancy and has Seizure disorder ([place]); Bipolar 1 disorder ([place]); Tobacco use disorder; History of gestational hypertension; Nausea and vomiting during pregnancy; Supervision of high risk pregnancy, antepartum; Rh negative, antepartum; Pregnancy affected by fetal growth restriction; History of pyelonephritis during pregnancy; and Pyelonephritis affecting pregnancy, antepartum on their problem list.  Patient reports no complaints. She denies suicidal or homicidal ideation.  Contractions: Irritability. Vag. Bleeding: None.  Movement: Present. Denies leaking of fluid.   The following portions of the patient's history were reviewed and updated as appropriate: allergies, current medications, past family history, past medical history, past social history, past surgical history and problem list.   Objective:   Vitals:   11/28/18 1053  BP: (!) 122/58  Pulse: 67  Weight: 138 lb 12.8 oz (63 kg)    Fetal Status: Fetal Heart Rate (bpm): 156   Movement: Present     General:  Alert, oriented and cooperative. Patient is in no acute distress.  Skin: Skin is warm and dry. No rash noted.   Cardiovascular: Normal heart rate noted  Respiratory: Normal respiratory effort, no problems with respiration noted  Abdomen: Soft, gravid, appropriate for gestational age.  Pain/Pressure: Absent     Pelvic: Cervical exam deferred        Extremities: Normal range of motion.  Edema: None  Mental Status: Normal mood and affect. Normal behavior. Normal judgment and thought content.   Assessment and Plan:  Pregnancy: [id number] at [redacted]w[redacted]d 1. Supervision of high risk pregnancy, antepartum   2. Bipolar 1 disorder ([place]) - I offered an appt with Roselyn Reef, she agrees but says that she cannot do it today  3. Seizure disorder ([place]) -  on Keppra with no side effects  4. Pregnancy affected by fetal growth restriction - start BMZ today and tomorrow  Preterm labor symptoms and general obstetric precautions including but not limited to vaginal bleeding, contractions, leaking of fluid and fetal movement were reviewed in detail with the patient. Please refer to After Visit Summary for other counseling recommendations.   No follow-ups on file.  Future Appointments  Date Time Provider [place]  12/05/2018  9:15 AM [place] NURSE [place] MFC-US  12/05/2018  9:15 AM [place] 4 WH-MFCUS MFC-US  12/05/2018 10:00 AM WH-MFC NST [place] MFC-US  12/05/2018 10:15 AM Truett Mainland, DO WOC-WOCA WOC  12/12/2018 10:55 AM Nehemiah Settle, Tanna Savoy, DO WOC-WOCA WOC    Emily Filbert, MD

## 2018-11-28 NOTE — Progress Notes (Signed)
Pt placed on PHQ-9 a level 1 stating that she had thoughts of hurting herself.  Per Dr. Hulan Fray, pt informed her that she did not think of harming herself and that she would get an appt scheduled with Roselyn Reef.  Pt has an appt scheduled with Roselyn Reef on 11/30/18.

## 2018-11-29 ENCOUNTER — Ambulatory Visit (INDEPENDENT_AMBULATORY_CARE_PROVIDER_SITE_OTHER): Payer: Medicaid Other

## 2018-11-29 VITALS — BP 122/58 | HR 89 | Wt 141.7 lb

## 2018-11-29 DIAGNOSIS — O365930 Maternal care for other known or suspected poor fetal growth, third trimester, not applicable or unspecified: Secondary | ICD-10-CM | POA: Diagnosis not present

## 2018-11-29 DIAGNOSIS — Z3A29 29 weeks gestation of pregnancy: Secondary | ICD-10-CM

## 2018-11-29 DIAGNOSIS — O0990 Supervision of high risk pregnancy, unspecified, unspecified trimester: Secondary | ICD-10-CM

## 2018-11-29 LAB — GLUCOSE TOLERANCE, 2 HOURS W/ 1HR
Glucose, 1 hour: 76 mg/dL (ref 65–179)
Glucose, 2 hour: 76 mg/dL (ref 65–152)
Glucose, Fasting: 79 mg/dL (ref 65–91)

## 2018-11-29 MED ORDER — BETAMETHASONE SOD PHOS & ACET 6 (3-3) MG/ML IJ SUSP
12.0000 mg | Freq: Once | INTRAMUSCULAR | Status: AC
  Administered 2018-11-29: 08:00:00 12 mg via INTRAMUSCULAR

## 2018-11-29 NOTE — Progress Notes (Signed)
Renee Velez here for second Betamethasone  Injection.  Injection administered without complication.   Annabell Howells, RN 11/29/2018  8:25 AM

## 2018-11-29 NOTE — BH Specialist Note (Signed)
Pt did not arrive to video visit and did not answer the phone; Pt's phone did not go to voicemail, so no phone message left; Left MyChart message for patient.   Integrated Behavioral Health via Telemedicine Video Visit  11/29/2018 [name] [id number]  [name]  Depression screen [facility] 2/9 11/28/2018 04/13/2016  Decreased Interest 2 1  Down, Depressed, Hopeless 2 0  PHQ - 2 Score 4 1  Altered sleeping 0 2  Tired, decreased energy 0 2  Change in appetite 0 1  Feeling bad or failure about yourself  1 0  Trouble concentrating 1 0  Moving slowly or fidgety/restless 0 0  Suicidal thoughts 1 0  PHQ-9 Score 7 6   GAD 7 : Generalized Anxiety Score 11/28/2018 04/13/2016  Nervous, Anxious, on Edge 2 1  Control/stop worrying 1 1  Worry too much - different things 1 1  Trouble relaxing 2 1  Restless 2 2  Easily annoyed or irritable 2 2  Afraid - awful might happen 2 0  Total GAD 7 Score 12 8

## 2018-11-30 ENCOUNTER — Ambulatory Visit: Payer: Medicaid Other | Admitting: Clinical

## 2018-11-30 ENCOUNTER — Other Ambulatory Visit: Payer: Self-pay

## 2018-11-30 DIAGNOSIS — Z5329 Procedure and treatment not carried out because of patient's decision for other reasons: Secondary | ICD-10-CM

## 2018-11-30 DIAGNOSIS — Z91199 Patient's noncompliance with other medical treatment and regimen due to unspecified reason: Secondary | ICD-10-CM

## 2018-12-02 NOTE — Progress Notes (Signed)
I have reviewed this chart and agree with the RN/CMA assessment and management.    Roxie Kreeger C Aurel Nguyen, MD, FACOG Attending Physician, Faculty Practice [facility]  

## 2018-12-03 ENCOUNTER — Other Ambulatory Visit: Payer: Self-pay

## 2018-12-03 ENCOUNTER — Inpatient Hospital Stay (HOSPITAL_COMMUNITY)
Admission: EM | Admit: 2018-12-03 | Discharge: 2018-12-06 | DRG: 833 | Disposition: A | Discharge status: Home / Self Care | Payer: Medicaid Other | Attending: Obstetrics and Gynecology | Admitting: Obstetrics and Gynecology

## 2018-12-03 ENCOUNTER — Encounter (HOSPITAL_COMMUNITY): Payer: Self-pay

## 2018-12-03 DIAGNOSIS — R109 Unspecified abdominal pain: Secondary | ICD-10-CM | POA: Diagnosis present

## 2018-12-03 DIAGNOSIS — Z6791 Unspecified blood type, Rh negative: Secondary | ICD-10-CM

## 2018-12-03 DIAGNOSIS — O09293 Supervision of pregnancy with other poor reproductive or obstetric history, third trimester: Secondary | ICD-10-CM | POA: Diagnosis not present

## 2018-12-03 DIAGNOSIS — O09893 Supervision of other high risk pregnancies, third trimester: Secondary | ICD-10-CM | POA: Diagnosis not present

## 2018-12-03 DIAGNOSIS — O26893 Other specified pregnancy related conditions, third trimester: Secondary | ICD-10-CM | POA: Diagnosis present

## 2018-12-03 DIAGNOSIS — O365930 Maternal care for other known or suspected poor fetal growth, third trimester, not applicable or unspecified: Secondary | ICD-10-CM | POA: Diagnosis present

## 2018-12-03 DIAGNOSIS — O365990 Maternal care for other known or suspected poor fetal growth, unspecified trimester, not applicable or unspecified: Secondary | ICD-10-CM | POA: Diagnosis present

## 2018-12-03 DIAGNOSIS — O99353 Diseases of the nervous system complicating pregnancy, third trimester: Secondary | ICD-10-CM | POA: Diagnosis present

## 2018-12-03 DIAGNOSIS — Z3A30 30 weeks gestation of pregnancy: Secondary | ICD-10-CM | POA: Diagnosis not present

## 2018-12-03 DIAGNOSIS — O26899 Other specified pregnancy related conditions, unspecified trimester: Secondary | ICD-10-CM | POA: Diagnosis present

## 2018-12-03 DIAGNOSIS — O2303 Infections of kidney in pregnancy, third trimester: Principal | ICD-10-CM | POA: Diagnosis present

## 2018-12-03 DIAGNOSIS — O99333 Smoking (tobacco) complicating pregnancy, third trimester: Secondary | ICD-10-CM | POA: Diagnosis present

## 2018-12-03 DIAGNOSIS — G40909 Epilepsy, unspecified, not intractable, without status epilepticus: Secondary | ICD-10-CM | POA: Diagnosis present

## 2018-12-03 DIAGNOSIS — F319 Bipolar disorder, unspecified: Secondary | ICD-10-CM | POA: Diagnosis present

## 2018-12-03 DIAGNOSIS — Z20828 Contact with and (suspected) exposure to other viral communicable diseases: Secondary | ICD-10-CM | POA: Diagnosis present

## 2018-12-03 DIAGNOSIS — F17210 Nicotine dependence, cigarettes, uncomplicated: Secondary | ICD-10-CM | POA: Diagnosis present

## 2018-12-03 DIAGNOSIS — O360130 Maternal care for anti-D [Rh] antibodies, third trimester, not applicable or unspecified: Secondary | ICD-10-CM | POA: Diagnosis not present

## 2018-12-03 DIAGNOSIS — O99343 Other mental disorders complicating pregnancy, third trimester: Secondary | ICD-10-CM | POA: Diagnosis present

## 2018-12-03 DIAGNOSIS — Z362 Encounter for other antenatal screening follow-up: Secondary | ICD-10-CM | POA: Diagnosis not present

## 2018-12-03 DIAGNOSIS — O2300 Infections of kidney in pregnancy, unspecified trimester: Secondary | ICD-10-CM | POA: Diagnosis present

## 2018-12-03 DIAGNOSIS — R10A0 Flank pain, unspecified side: Secondary | ICD-10-CM | POA: Diagnosis present

## 2018-12-03 LAB — URINALYSIS, ROUTINE W REFLEX MICROSCOPIC
Bilirubin Urine: NEGATIVE
Glucose, UA: NEGATIVE mg/dL
Ketones, ur: NEGATIVE mg/dL
Nitrite: NEGATIVE
Protein, ur: 100 mg/dL — AB
Specific Gravity, Urine: 1.01 (ref 1.005–1.030)
pH: 8 (ref 5.0–8.0)

## 2018-12-03 LAB — BASIC METABOLIC PANEL
Anion gap: 9 (ref 5–15)
BUN: 7 mg/dL (ref 6–20)
CO2: 22 mmol/L (ref 22–32)
Calcium: 8.9 mg/dL (ref 8.9–10.3)
Chloride: 106 mmol/L (ref 98–111)
Creatinine, Ser: 0.61 mg/dL (ref 0.44–1.00)
GFR calc Af Amer: >60 mL/min (ref >60)
GFR calc non Af Amer: >60 mL/min (ref >60)
Glucose, Bld: 89 mg/dL (ref 70–99)
Potassium: 3.4 mmol/L — ABNORMAL LOW (ref 3.5–5.1)
Sodium: 137 mmol/L (ref 135–145)

## 2018-12-03 LAB — CBC WITH DIFFERENTIAL/PLATELET
Abs Immature Granulocytes: 0.13 10*3/uL — ABNORMAL HIGH (ref 0.00–0.07)
Basophils Absolute: 0.1 10*3/uL (ref 0.0–0.1)
Basophils Relative: 0 %
Eosinophils Absolute: 0.1 10*3/uL (ref 0.0–0.5)
Eosinophils Relative: 1 %
HCT: 30 % — ABNORMAL LOW (ref 36.0–46.0)
Hemoglobin: 9.7 g/dL — ABNORMAL LOW (ref 12.0–15.0)
Immature Granulocytes: 1 %
Lymphocytes Relative: 16 %
Lymphs Abs: 3.4 10*3/uL (ref 0.7–4.0)
MCH: 29.6 pg (ref 26.0–34.0)
MCHC: 32.3 g/dL (ref 30.0–36.0)
MCV: 91.5 fL (ref 80.0–100.0)
Monocytes Absolute: 1.3 10*3/uL — ABNORMAL HIGH (ref 0.1–1.0)
Monocytes Relative: 6 %
Neutro Abs: 16 10*3/uL — ABNORMAL HIGH (ref 1.7–7.7)
Neutrophils Relative %: 76 %
Platelets: 251 10*3/uL (ref 150–400)
RBC: 3.28 MIL/uL — ABNORMAL LOW (ref 3.87–5.11)
RDW: 14 % (ref 11.5–15.5)
WBC: 21 10*3/uL — ABNORMAL HIGH (ref 4.0–10.5)
nRBC: 0 % (ref 0.0–0.2)

## 2018-12-03 LAB — SARS CORONAVIRUS 2 BY RT PCR (HOSPITAL ORDER, PERFORMED IN ~~LOC~~ HOSPITAL LAB): SARS Coronavirus 2: NEGATIVE

## 2018-12-03 MED ORDER — SODIUM CHLORIDE 0.9 % IV BOLUS: 500.0000 mL | Freq: Once | INTRAVENOUS | Status: DC

## 2018-12-03 MED ORDER — ACETAMINOPHEN 325 MG PO TABS: 650.0000 mg | ORAL_TABLET | ORAL | Status: DC | PRN

## 2018-12-03 MED ORDER — NIFEDIPINE 10 MG PO CAPS
10.0000 mg | ORAL_CAPSULE | Freq: Once | ORAL | Status: AC
  Administered 2018-12-03: 10 mg via ORAL
  Filled 2018-12-03: qty 1

## 2018-12-03 MED ORDER — CALCIUM CARBONATE ANTACID 500 MG PO CHEW: 2.0000 | CHEWABLE_TABLET | ORAL | Status: DC | PRN

## 2018-12-03 MED ORDER — PRENATAL MULTIVITAMIN CH
1.0000 | ORAL_TABLET | Freq: Every day | ORAL | Status: DC
  Administered 2018-12-04: 1 via ORAL
  Filled 2018-12-03: qty 1

## 2018-12-03 MED ORDER — OXYCODONE-ACETAMINOPHEN 5-325 MG PO TABS
2.0000 | ORAL_TABLET | Freq: Four times a day (QID) | ORAL | Status: DC | PRN
  Administered 2018-12-04 – 2018-12-05 (×4): 2 via ORAL
  Filled 2018-12-03 (×4): qty 2

## 2018-12-03 MED ORDER — OXYCODONE-ACETAMINOPHEN 5-325 MG PO TABS
2.0000 | ORAL_TABLET | Freq: Once | ORAL | Status: AC
  Administered 2018-12-03: 2 via ORAL
  Filled 2018-12-03: qty 2

## 2018-12-03 MED ORDER — DOCUSATE SODIUM 100 MG PO CAPS
100.0000 mg | ORAL_CAPSULE | Freq: Every day | ORAL | Status: DC
  Administered 2018-12-04: 100 mg via ORAL
  Filled 2018-12-03: qty 1

## 2018-12-03 MED ORDER — SODIUM CHLORIDE 0.9 % IV SOLN
INTRAVENOUS | Status: DC
  Administered 2018-12-04 – 2018-12-06 (×10): via INTRAVENOUS

## 2018-12-03 MED ORDER — LACTATED RINGERS IV BOLUS
500.0000 mL | Freq: Once | INTRAVENOUS | Status: AC
  Administered 2018-12-03: 500 mL via INTRAVENOUS

## 2018-12-03 MED ORDER — SODIUM CHLORIDE 0.9 % IV SOLN
1.0000 g | Freq: Two times a day (BID) | INTRAVENOUS | Status: DC
  Administered 2018-12-04 (×2): 1 g via INTRAVENOUS
  Filled 2018-12-03 (×3): qty 10

## 2018-12-03 MED ORDER — ZOLPIDEM TARTRATE 5 MG PO TABS
5.0000 mg | ORAL_TABLET | Freq: Every day | ORAL | Status: DC
  Administered 2018-12-04: 01:00:00 5 mg via ORAL
  Filled 2018-12-03 (×2): qty 1

## 2018-12-03 MED ORDER — ZOLPIDEM TARTRATE 5 MG PO TABS: 5.0000 mg | ORAL_TABLET | Freq: Every evening | ORAL | Status: DC | PRN

## 2018-12-03 NOTE — ED Notes (Signed)
Called [facility]@19 :12pm.

## 2018-12-03 NOTE — Progress Notes (Signed)
Pt is a G5 P4, all vaginal deliveries presenting with c/o uterine contractions and vaginal pressure. Cervix is FT and posterior. No bloody show.She is contracting every 2-4 min. She is rating her pain as a 7 out of 10 on her pain scale.This pregnancy is complicated by IUGR. It was documented in the chart that pt received a second dose of betamethasone on 11/29/2018. Pt says she has epilepsy and takes kepra 1000mg  po every day. Says she took it this morning. She says that her last seizure was 2 weeks ago. Pt says she was seen 2 weeks ago for a ? UTI, but could not stay in the hospital due to lack of child care. Says she did not take any meds for her UTI. She thinks she was seen at [facility] but she is not sure. Pt also has bipolar disorder.

## 2018-12-03 NOTE — ED Triage Notes (Signed)
Patient was dropped off by significant other in front of ED. Patient is having contractions and is 31 weeks and 1 day pregnant. 4th living child and 5th pregnancy. All delivery's were vaginal delivery's. Patients last delviery required Mag Sulfate due to Blood pressure being elevated.   Rapid Response has arrived at this time.

## 2018-12-03 NOTE — Progress Notes (Signed)
Report given to [facility], MAU, [facility].

## 2018-12-03 NOTE — Progress Notes (Signed)
Patient also verbalizes decreased fetal movement for last 2 days. She states she drank caffeine this morning to try to make baby move. No movement noted until after arrival to ED.

## 2018-12-03 NOTE — MAU Note (Signed)
Pt transferred from [facility] with c/o ctx and flank pain. Given IVF and procardia and pt reports ctx much milder and not as frequent. Still c/o bilat eral flank pain. Pt Dx with Pyleonephritis but was unable to stay for IV ABX due to child care issues.

## 2018-12-03 NOTE — Progress Notes (Signed)
Report given to [facility].

## 2018-12-03 NOTE — Progress Notes (Signed)
Spoke with Dr. Rip Harbour. Pt is a G5P4 at 30 1/[redacted] weeks gestation c/o uc's and lower abd pressure.Cervix is FT, posterior, presenting part high. Uc's are ? 2-4 min, mild to moderate by palpation. FHR is a category 1 tracing. Pt has received 527ml of a LR bolus..Orders received for the other 544ml and for 10mg  of po procardia. Pt is to be transferred to [facility] for further evaluation. I updated Dr. Rip Harbour on the pt's hx in my previous note.

## 2018-12-03 NOTE — ED Provider Notes (Signed)
[facility]-EMERGENCY DEPT Provider Note   CSN: [id number] Arrival date & time: 12/03/18  1747     History   Chief Complaint Chief Complaint  Patient presents with  . Contractions @ 31.1 Weeks    4th Living, 5th pregnancy    HPI Renee Velez is a 23 y.o. female.     HPI   She presents for evaluation of abdominal cramping since yesterday, [redacted] weeks pregnant, G4, P3.  Pregnancy complicated by intrauterine growth retardation.  She denies recent illnesses.  Past Medical History:  Diagnosis Date  . Anxiety   . Chlamydia   . Epilepsy (HCC)   . Gonorrhea   . Post partum depression 2018  . Seizure [facility])     Patient Active Problem List   Diagnosis Date Noted  . History of pyelonephritis during pregnancy 11/17/2018  . Pyelonephritis affecting pregnancy, antepartum 11/17/2018  . Pregnancy affected by fetal growth restriction 09/21/2018  . Supervision of high risk pregnancy, antepartum 07/13/2018  . Rh negative, antepartum 07/13/2018  . History of gestational hypertension 04/13/2016  . Nausea and vomiting during pregnancy 04/13/2016  . Seizure disorder (HCC) 09/10/2015  . Bipolar 1 disorder (HCC) 09/10/2015  . Tobacco use disorder 09/10/2015    Past Surgical History:  Procedure Laterality Date  . NO PAST SURGERIES       OB History    Gravida  5   Para  3   Term  3   Preterm      AB  1   Living  3     SAB  1   TAB  0   Ectopic  0   Multiple  0   Live Births  3            Home Medications    Prior to Admission medications   Medication Sig Start Date End Date Taking? Authorizing Provider  aspirin EC 81 MG tablet Take 1 tablet (81 mg total) by mouth daily. Take after 12 weeks for prevention of preeclampsia later in pregnancy Patient not taking: Reported on 11/22/2018 07/27/18   Anyanwu, Jethro Bastos, MD  cephALEXin (KEFLEX) 500 MG capsule Take 1 capsule (500 mg total) by mouth 4 (four) times daily. 11/17/18   Sharyon Cable,  CNM  folic acid (FOLVITE) 1 MG tablet Take 1 tablet (1 mg total) by mouth daily. Patient not taking: Reported on 11/22/2018 11/17/18   Leftwich-Kirby, Wilmer Floor, CNM  levETIRAcetam (KEPPRA) 1000 MG tablet Take 1 tablet (1,000 mg total) by mouth 2 (two) times daily. 11/17/18 12/17/18  Leftwich-Kirby, Wilmer Floor, CNM  Prenatal Vit-Fe Fumarate-FA (PRENATAL MULTIVITAMIN) TABS tablet Take 1 tablet by mouth daily at 12 noon.    [provider]    Family History Family History  Problem Relation Age of Onset  . Hypertension Father   . Cancer Maternal Grandmother   . Cancer Paternal Grandmother   . Diabetes Paternal Grandfather   . Thyroid disease Sister   . ADD / ADHD Brother     Social History Social History   Tobacco Use  . Smoking status: Current Every Day Smoker    Packs/day: 0.25    Types: Cigarettes  . Smokeless tobacco: Never Used  Substance Use Topics  . Alcohol use: Not Currently  . Drug use: No     Allergies   Nickel   Review of Systems Review of Systems  All other systems reviewed and are negative.    Physical Exam Updated Vital Signs BP (!) 117/58  Pulse 73   Temp 97.7 F (36.5 C) (Oral)   Resp 20   Ht (S) 5\' 4"  (1.626 m)   Wt (S) 64.3 kg   LMP 04/26/2018 (Approximate)   SpO2 100%   BMI 24.32 kg/m   Physical Exam Vitals signs and nursing note reviewed.  Constitutional:      Appearance: She is well-developed.  HENT:     Head: Normocephalic and atraumatic.     Right Ear: External ear normal.     Left Ear: External ear normal.  Eyes:     Conjunctiva/sclera: Conjunctivae normal.     Pupils: Pupils are equal, round, and reactive to light.  Neck:     Musculoskeletal: Normal range of motion and neck supple.     Trachea: Phonation normal.  Cardiovascular:     Rate and Rhythm: Normal rate and regular rhythm.     Heart sounds: Normal heart sounds.  Pulmonary:     Effort: Pulmonary effort is normal.     Breath sounds: Normal breath sounds.   Abdominal:     General: There is distension.     Palpations: Abdomen is soft.     Tenderness: There is no abdominal tenderness. There is no guarding.     Comments: Gravid  Musculoskeletal: Normal range of motion.  Skin:    General: Skin is warm and dry.  Neurological:     Mental Status: She is alert and oriented to person, place, and time.     Cranial Nerves: No cranial nerve deficit.     Sensory: No sensory deficit.     Motor: No abnormal muscle tone.     Coordination: Coordination normal.  Psychiatric:        Mood and Affect: Mood normal.        Behavior: Behavior normal.        Thought Content: Thought content normal.        Judgment: Judgment normal.      ED Treatments / Results  Labs (all labs ordered are listed, but only abnormal results are displayed) Labs Reviewed  BASIC METABOLIC PANEL - Abnormal; Notable for the following components:      Result Value   Potassium 3.4 (*)    All other components within normal limits  CBC WITH DIFFERENTIAL/PLATELET - Abnormal; Notable for the following components:   WBC 21.0 (*)    RBC 3.28 (*)    Hemoglobin 9.7 (*)    HCT 30.0 (*)    Neutro Abs 16.0 (*)    Monocytes Absolute 1.3 (*)    Abs Immature Granulocytes 0.13 (*)    All other components within normal limits  URINALYSIS, ROUTINE W REFLEX MICROSCOPIC - Abnormal; Notable for the following components:   APPearance CLOUDY (*)    Hgb urine dipstick MODERATE (*)    Protein, ur 100 (*)    Leukocytes,Ua LARGE (*)    Bacteria, UA RARE (*)    All other components within normal limits  SARS CORONAVIRUS 2 BY RT PCR (HOSPITAL ORDER, PERFORMED IN [facility])  URINE CULTURE    EKG None  Radiology No results found.  Procedures Procedures (including critical care time)  Medications Ordered in ED Medications  NIFEdipine (PROCARDIA) capsule 10 mg (10 mg Oral Given 12/03/18 1831)  lactated ringers bolus 500 mL (500 mLs Intravenous New Bag/Given 12/03/18 1831)      Initial Impression / Assessment and Plan / ED Course  I have reviewed the triage vital signs and the nursing notes.  Pertinent labs & imaging results that were available during my care of the patient were reviewed by me and considered in my medical decision making (see chart for details).         Patient Vitals for the past 24 hrs:  BP Temp Temp src Pulse Resp SpO2 Height Weight  12/03/18 1900 (!) 117/58 - - 73 20 100 % - -  12/03/18 1815 - - - - - - 5\' 4"  (1.626 m) 64.3 kg  12/03/18 1812 130/62 97.7 F (36.5 C) Oral 80 (!) 24 100 % - -    6:01 PM Reevaluation with update and discussion. After initial assessment and treatment, an updated evaluation reveals she is more comfortable now.  Rapid response nurse is with the patient and states that the obstetrician plans on observing her at [facility], for an additional period of time.  At this time [facility] is here to transfer the patient. Daleen Bo   Medical Decision Making: Preterm labor, with ongoing contraction.  Patient requires ongoing management by obstetrics service.  CRITICAL CARE-no Performed by: Daleen Bo  Nursing Notes Reviewed/ Care Coordinated Applicable Imaging Reviewed Interpretation of Laboratory Data incorporated into ED treatment  Plan-[facility] hospital for [facility] care.  Final Clinical Impressions(s) / ED Diagnoses   Final diagnoses:  Preterm labor in third trimester without delivery    ED Discharge Orders    None       Daleen Bo, MD 12/03/18 2040

## 2018-12-04 ENCOUNTER — Inpatient Hospital Stay (HOSPITAL_COMMUNITY): Payer: Medicaid Other

## 2018-12-04 ENCOUNTER — Encounter (HOSPITAL_COMMUNITY): Payer: Self-pay | Admitting: *Deleted

## 2018-12-04 DIAGNOSIS — Z3A30 30 weeks gestation of pregnancy: Secondary | ICD-10-CM

## 2018-12-04 DIAGNOSIS — O2303 Infections of kidney in pregnancy, third trimester: Principal | ICD-10-CM

## 2018-12-04 MED ORDER — PROMETHAZINE HCL 25 MG/ML IJ SOLN
INTRAMUSCULAR | Status: AC
  Administered 2018-12-04: 02:00:00 25 mg
  Filled 2018-12-04: qty 1

## 2018-12-04 MED ORDER — LEVETIRACETAM 500 MG PO TABS
1000.0000 mg | ORAL_TABLET | Freq: Two times a day (BID) | ORAL | Status: DC
  Administered 2018-12-04 – 2018-12-05 (×3): 1000 mg via ORAL
  Filled 2018-12-04 (×3): qty 2

## 2018-12-04 MED ORDER — LEVETIRACETAM 500 MG PO TABS
500.0000 mg | ORAL_TABLET | Freq: Two times a day (BID) | ORAL | Status: DC
  Administered 2018-12-04: 10:00:00 500 mg via ORAL
  Filled 2018-12-04: qty 1

## 2018-12-04 MED ORDER — PROMETHAZINE HCL 25 MG/ML IJ SOLN
25.0000 mg | Freq: Four times a day (QID) | INTRAMUSCULAR | Status: DC | PRN
  Administered 2018-12-04 – 2018-12-05 (×3): 25 mg via INTRAVENOUS
  Filled 2018-12-04 (×3): qty 1

## 2018-12-04 MED ORDER — SODIUM CHLORIDE 0.9 % IV SOLN
1.0000 g | INTRAVENOUS | Status: DC
  Administered 2018-12-05: 1 g via INTRAVENOUS
  Filled 2018-12-04: qty 10
  Filled 2018-12-04: qty 1

## 2018-12-04 MED ORDER — LEVETIRACETAM 500 MG PO TABS
500.0000 mg | ORAL_TABLET | Freq: Once | ORAL | Status: AC
  Administered 2018-12-04: 13:00:00 500 mg via ORAL
  Filled 2018-12-04: qty 1

## 2018-12-04 MED FILL — Calcium Gluconate Inj 10%: INTRAVENOUS | Qty: 1 | Status: CN

## 2018-12-04 NOTE — H&P (Signed)
History     CSN: [id number]  Arrival date and time: 12/03/18 4098   First Provider Initiated Contact with Patient 12/03/18 2301      Chief Complaint  Patient presents with  . Contractions @ 31.1 Weeks    4th Living, 5th pregnancy   HPI  Ms.  Renee Velez is a 23 y.o. year old [id number] female at [redacted]w[redacted]d weeks gestation who was transferred to MAU from [facility] with complaint of bilateral flank pain. She states she has been hurting on the LT side for about 1 week, but last night the pain got stronger and on both sides. She was recently dx'd with pyelonephritis about 2 wks ago. It was recommended that she be admitted on that day, but she could not stay d/t childcare issues. She ws given a shot of Rocephin and Rx'd Keflex to take at home. She completed the course of Keflex. She was given BMZ on 10/26 & 10/27. She had a normal 2 hr GTT (79-76-76) on 10/26. She went to [facility], because the pain was getting way too bad tonight. She has childcare issues taken care of and is prepared to be admitted tonight.   Past Medical History:  Diagnosis Date  . Anxiety   . Chlamydia   . Epilepsy (HCC)   . Gonorrhea   . Post partum depression 2018  . Seizure [facility])     Past Surgical History:  Procedure Laterality Date  . NO PAST SURGERIES      Family History  Problem Relation Age of Onset  . Hypertension Father   . Cancer Maternal Grandmother   . Cancer Paternal Grandmother   . Diabetes Paternal Grandfather   . Thyroid disease Sister   . ADD / ADHD Brother     Social History   Tobacco Use  . Smoking status: Current Every Day Smoker    Packs/day: 0.25    Types: Cigarettes  . Smokeless tobacco: Never Used  Substance Use Topics  . Alcohol use: Not Currently  . Drug use: No    Allergies:  Allergies  Allergen Reactions  . Nickel Rash    Medications Prior to Admission  Medication Sig Dispense Refill Last Dose  . aspirin EC 81 MG tablet Take 1 tablet (81 mg total) by mouth daily. Take after  12 weeks for prevention of preeclampsia later in pregnancy 300 tablet 2 12/03/2018 at Unknown time  . cephALEXin (KEFLEX) 500 MG capsule Take 1 capsule (500 mg total) by mouth 4 (four) times daily. 38 capsule 0 Past Month at Unknown time  . levETIRAcetam (KEPPRA) 1000 MG tablet Take 1 tablet (1,000 mg total) by mouth 2 (two) times daily. 60 tablet 5 12/03/2018 at Unknown time  . Prenatal Vit-Fe Fumarate-FA (PRENATAL MULTIVITAMIN) TABS tablet Take 1 tablet by mouth daily at 12 noon.   12/03/2018 at Unknown time  . folic acid (FOLVITE) 1 MG tablet Take 1 tablet (1 mg total) by mouth daily. (Patient not taking: Reported on 11/22/2018) 30 tablet 5     Review of Systems  Constitutional: Negative.   HENT: Negative.   Eyes: Negative.   Respiratory: Negative.   Cardiovascular: Negative.   Gastrointestinal: Negative.   Endocrine: Negative.   Genitourinary: Negative for pelvic pain ("contractions were every 2-4 mins, but stopped when they gave me medication and IVFs at [facility]").  Musculoskeletal: Positive for back pain (bilateral).  Skin: Negative.   Allergic/Immunologic: Negative.   Neurological: Negative.   Hematological: Negative.   Psychiatric/Behavioral: Negative.    Physical  Exam   Blood pressure (!) 109/53, pulse 84, temperature 98.4 F (36.9 C), resp. rate 18, height (S) 5\' 4"  (1.626 m), weight (S) 64.3 kg, last menstrual period 04/26/2018, SpO2 100 %.  Physical Exam  Nursing note and vitals reviewed. Constitutional: She is oriented to person, place, and time. She appears well-developed and well-nourished.  HENT:  Head: Normocephalic and atraumatic.  Eyes: Pupils are equal, round, and reactive to light. Conjunctivae are normal.  Neck: Normal range of motion.  Cardiovascular: Normal rate.  Respiratory: Effort normal.  GI: Soft. There is CVA tenderness (significant, bilateral).  Genitourinary:    Genitourinary Comments: Pelvic deferred  FT/thick/high/posterior per Rapid OB RN    Musculoskeletal: Normal range of motion.  Neurological: She is alert and oriented to person, place, and time.  Skin: Skin is warm and dry.  Psychiatric: She has a normal mood and affect. Her behavior is normal. Judgment and thought content normal.   NST - FHR: 140 bpm / moderate variability / accels present / decels absent / TOCO: none   MAU Course  Procedures  MDM CEFM *Consult with Dr. Rip Harbour @ 2326 - notified of patient's complaints, assessments, lab & NST results, tx plan admit to [facility] for IV abx - orders received: Rocephin 1 gm IV q 12 hrs, Percocet 5/325 mg x 2 every 6 hrs po, Ambien 5 mg po hs, NST q shift x 30 mins, Renal U/S tomorrow  Assessment and Plan  Pyelonephritis affecting pregnancy, antepartum - Admit to OBSCU - Routine Antenatal admission orders - Rocephin 1 gm every 12 hrs IV - Percocet 5/325 mg 2 tabs every 6 hrs prn pain - Ambien 5 mg po hs - Renal U/S on 12/04/2018 - NST every shift x 30 mins - OB MFM Follow-up U/S here instead of [place] office on Monday 12/05/2018  Laury Deep, MSN, CNM 12/03/2018, 11:10 PM

## 2018-12-04 NOTE — Progress Notes (Signed)
Patient ID: Renee Velez, female   DOB: Nov 28, 1995, 23 y.o.   MRN: [id number] [place] ANTEPARTUM COMPREHENSIVE PROGRESS NOTE  Renee Velez is a 23 y.o. [id number] at [redacted]w[redacted]d  who is admitted for pyelonephritis.   Fetal presentation is cephalic. U/S 11/28/18 Length of Stay:  1  Days  Subjective: Pt reports still having back pain. No N/V. Tolerating diet. + FM. No ut ctx, vaginal bleeding or LOF  Vitals:  Blood pressure (!) 101/49, pulse 65, temperature 97.8 F (36.6 C), temperature source Oral, resp. rate 18, height (S) 5\' 4"  (1.626 m), weight (S) 64.3 kg, last menstrual period 04/26/2018, SpO2 100 %, unknown if currently breastfeeding.   Physical Examination: Lungs clear Heart RRR Abd soft + BS gravid non tender Back CVA tenderness Rt > Lt Ext non tender  Fetal Monitoring:  120's, + accels, no decels, no ut ctx  Labs:  Results for orders placed or performed during the hospital encounter of 12/03/18 (from the past 24 hour(s))  Basic metabolic panel   Collection Time: 12/03/18  6:00 PM  Result Value Ref Range   Sodium 137 135 - 145 mmol/L   Potassium 3.4 (L) 3.5 - 5.1 mmol/L   Chloride 106 98 - 111 mmol/L   CO2 22 22 - 32 mmol/L   Glucose, Bld 89 70 - 99 mg/dL   BUN 7 6 - 20 mg/dL   Creatinine, Ser 0.61 0.44 - 1.00 mg/dL   Calcium 8.9 8.9 - 10.3 mg/dL   GFR calc non Af Amer >60 >60 mL/min   GFR calc Af Amer >60 >60 mL/min   Anion gap 9 5 - 15  CBC with Differential   Collection Time: 12/03/18  6:00 PM  Result Value Ref Range   WBC 21.0 (H) 4.0 - 10.5 K/uL   RBC 3.28 (L) 3.87 - 5.11 MIL/uL   Hemoglobin 9.7 (L) 12.0 - 15.0 g/dL   HCT 30.0 (L) 36.0 - 46.0 %   MCV 91.5 80.0 - 100.0 fL   MCH 29.6 26.0 - 34.0 pg   MCHC 32.3 30.0 - 36.0 g/dL   RDW 14.0 11.5 - 15.5 %   Platelets 251 150 - 400 K/uL   nRBC 0.0 0.0 - 0.2 %   Neutrophils Relative % 76 %   Neutro Abs 16.0 (H) 1.7 - 7.7 K/uL   Lymphocytes Relative 16 %   Lymphs Abs 3.4 0.7 - 4.0 K/uL   Monocytes Relative 6 %   Monocytes Absolute 1.3 (H) 0.1 - 1.0 K/uL   Eosinophils Relative 1 %   Eosinophils Absolute 0.1 0.0 - 0.5 K/uL   Basophils Relative 0 %   Basophils Absolute 0.1 0.0 - 0.1 K/uL   Immature Granulocytes 1 %   Abs Immature Granulocytes 0.13 (H) 0.00 - 0.07 K/uL  SARS Coronavirus 2 by RT PCR (hospital order, performed in [place] hospital lab) Nasopharyngeal Nasopharyngeal Swab   Collection Time: 12/03/18  6:01 PM   Specimen: Nasopharyngeal Swab  Result Value Ref Range   SARS Coronavirus 2 NEGATIVE NEGATIVE  Urinalysis, Routine w reflex microscopic   Collection Time: 12/03/18  7:33 PM  Result Value Ref Range   Color, Urine YELLOW YELLOW   APPearance CLOUDY (A) CLEAR   Specific Gravity, Urine 1.010 1.005 - 1.030   pH 8.0 5.0 - 8.0   Glucose, UA NEGATIVE NEGATIVE mg/dL   Hgb urine dipstick MODERATE (A) NEGATIVE   Bilirubin Urine NEGATIVE NEGATIVE   Ketones, ur NEGATIVE NEGATIVE mg/dL   Protein, ur 100 (  A) NEGATIVE mg/dL   Nitrite NEGATIVE NEGATIVE   Leukocytes,Ua LARGE (A) NEGATIVE   RBC / HPF 21-50 0 - 5 RBC/hpf   WBC, UA 21-50 0 - 5 WBC/hpf   Bacteria, UA RARE (A) NONE SEEN   Squamous Epithelial / LPF 0-5 0 - 5   Mucus PRESENT   Type and screen [facility]   Collection Time: 12/04/18  5:13 AM  Result Value Ref Range   ABO/RH(D) A NEG    Antibody Screen POS    Sample Expiration 12/07/2018,2359    Antibody Identification      PASSIVELY ACQUIRED ANTI-D Performed at [facility], [address], [place], [place] [address]    Unit Number [id number]    Blood Component Type RED CELLS,LR    Unit division 00    Status of Unit ALLOCATED    Transfusion Status OK TO TRANSFUSE    Crossmatch Result COMPATIBLE    Unit Number [id number]    Blood Component Type RED CELLS,LR    Unit division 00    Status of Unit ALLOCATED    Transfusion Status OK TO TRANSFUSE    Crossmatch Result COMPATIBLE   BPAM RBC   Collection Time: 12/04/18  5:13 AM  Result Value  Ref Range   Blood Product Unit Number [id number]    PRODUCT CODE E0382V00    Unit Type and Rh 9500    Blood Product Expiration Date [id number]    Blood Product Unit Number [id number]    PRODUCT CODE K5537S82    Unit Type and Rh 9500    Blood Product Expiration Date 707867544920     Imaging Studies:    NA   Medications:  Scheduled . docusate sodium  100 mg Oral Daily  . levETIRAcetam  500 mg Oral BID  . prenatal multivitamin  1 tablet Oral Q1200  . zolpidem  5 mg Oral QHS   I have reviewed the patient's current medications.  ASSESSMENT: IUP 24 2/7 weeks Pyelonephritis IUGR, S/P BMZ x 2 Sz disorder Bipolar disorder  Rh negative, S/P Rhogam H/O GHTN   PLAN: Stable. Afebrile. Growth scan, doppler studies and BPP tomorrow.  Continue routine antenatal care.   Hermina Staggers 12/04/2018,7:01 AM

## 2018-12-05 ENCOUNTER — Ambulatory Visit (HOSPITAL_COMMUNITY): Payer: Medicaid Other

## 2018-12-05 ENCOUNTER — Inpatient Hospital Stay (HOSPITAL_COMMUNITY): Payer: Medicaid Other

## 2018-12-05 ENCOUNTER — Ambulatory Visit (HOSPITAL_COMMUNITY)
Admission: RE | Admit: 2018-12-05 | Discharge: 2018-12-05 | Disposition: A | Discharge status: Home / Self Care | Payer: Medicaid Other | Source: Ambulatory Visit | Attending: Obstetrics | Admitting: Obstetrics

## 2018-12-05 ENCOUNTER — Other Ambulatory Visit (HOSPITAL_COMMUNITY): Payer: Medicaid Other

## 2018-12-05 ENCOUNTER — Encounter (HOSPITAL_COMMUNITY): Payer: Self-pay

## 2018-12-05 ENCOUNTER — Encounter: Payer: Medicaid Other | Admitting: Family Medicine

## 2018-12-05 DIAGNOSIS — F319 Bipolar disorder, unspecified: Secondary | ICD-10-CM

## 2018-12-05 DIAGNOSIS — O365930 Maternal care for other known or suspected poor fetal growth, third trimester, not applicable or unspecified: Secondary | ICD-10-CM

## 2018-12-05 DIAGNOSIS — O09293 Supervision of pregnancy with other poor reproductive or obstetric history, third trimester: Secondary | ICD-10-CM

## 2018-12-05 DIAGNOSIS — Z3A30 30 weeks gestation of pregnancy: Secondary | ICD-10-CM

## 2018-12-05 DIAGNOSIS — O09893 Supervision of other high risk pregnancies, third trimester: Secondary | ICD-10-CM

## 2018-12-05 DIAGNOSIS — O99343 Other mental disorders complicating pregnancy, third trimester: Secondary | ICD-10-CM

## 2018-12-05 DIAGNOSIS — O360130 Maternal care for anti-D [Rh] antibodies, third trimester, not applicable or unspecified: Secondary | ICD-10-CM

## 2018-12-05 DIAGNOSIS — Z362 Encounter for other antenatal screening follow-up: Secondary | ICD-10-CM

## 2018-12-05 DIAGNOSIS — O99353 Diseases of the nervous system complicating pregnancy, third trimester: Secondary | ICD-10-CM

## 2018-12-05 DIAGNOSIS — G40909 Epilepsy, unspecified, not intractable, without status epilepticus: Secondary | ICD-10-CM

## 2018-12-05 MED ORDER — NICOTINE 14 MG/24HR TD PT24
14.0000 mg | MEDICATED_PATCH | Freq: Every day | TRANSDERMAL | Status: DC
  Administered 2018-12-05: 15:00:00 14 mg via TRANSDERMAL
  Filled 2018-12-05: qty 1

## 2018-12-05 NOTE — Progress Notes (Addendum)
Renee Velez is a 22 y.o. [id number] at [redacted]w[redacted]d  who is admitted for pyelonephritis.   Fetal presentation is cephalic. U/S 11/28/18 Length of Stay:  2  Days  Subjective: Pt reports still having back pain. No N/V. Tolerating diet. + FM. No ut ctx, vaginal bleeding or LOF  Objective: VSS, AF Heart- rrr Lungs- CTAB Abd- benign, gravid + Right CVAT  Renal u/s from yesterday: IMPRESSION: 1. RIGHT-sided hydronephrosis, mild to moderate in degree, most likely physiologic given the third-trimester pregnancy. However, ureteral obstruction cannot be excluded as there was no RIGHT-sided ureteral jet visualized at the level of the bladder on today's exam. 2. LEFT kidney appears normal. 3. Bladder is unremarkable.  A/P. 1. Pyelonephritis at 30.[redacted] weeks EGA DOS: 2 Continue with IV rocephin Consider nephrology/urology consult if no rapid improvement.  2. Severe IUGR (less than 1%)- continue weekly dopplers and BPP with MFM (imaging scheduled for today)  She has had BMZ x 2  3. Seizure disorder- continue keppra

## 2018-12-06 LAB — URINE CULTURE: Culture: >=100000 — AB

## 2018-12-06 MED ORDER — CEPHALEXIN 500 MG PO CAPS: 500.0000 mg | ORAL_CAPSULE | Freq: Three times a day (TID) | ORAL | 0 refills | Status: DC

## 2018-12-06 NOTE — Discharge Summary (Signed)
Physician Discharge Summary  Patient ID: Renee Velez MRN: [id number] DOB/AGE: 25-Feb-1995 23 y.o.  Admit date: 12/03/2018 Discharge date: 12/06/2018  Admission Diagnoses:Principal Problem:   Pyelonephritis affecting pregnancy in third trimester Active Problems:   Pregnancy affected by fetal growth restriction  Discharge Diagnoses:  Principal Problem:   Pyelonephritis affecting pregnancy in third trimester Active Problems:   Pregnancy affected by fetal growth restriction   Discharged Condition: good  Hospital Course: Ms.  Renee Velez is a 23 y.o. year old G42P3013 female at [redacted]w[redacted]d weeks gestation who was transferred to MAU from [facility] with complaint of bilateral flank pain. She states she has been hurting on the LT side for about 1 week, but last night the pain got stronger and on both sides. She was recently dx'd with pyelonephritis about 2 wks ago. It was recommended that she be admitted on that day, but she could not stay d/t childcare issues. She ws given a shot of Rocephin and Rx'd Keflex to take at home. She completed the course of Keflex. She was given BMZ on 10/26 & 10/27. She had a normal 2 hr GTT (79-76-76) on 10/26. She went to [facility], because the pain was getting way too bad tonight. She has childcare issues taken care of and is prepared to be admitted tonight. Pyelonephritis affecting pregnancy, antepartum - Admit to OBSCU - Routine Antenatal admission orders - Rocephin 1 gm every 12 hrs IV - Percocet 5/325 mg 2 tabs every 6 hrs prn pain - Ambien 5 mg po hs - Renal U/S on 12/04/2018 - NST every shift x 30 mins - OB MFM Follow-up U/S here instead of [facility] office on Monday 12/05/2018 Consults: None  Significant Diagnostic Studies:  ----------------------------------------------------------------------  OBSTETRICS REPORT                       (Signed Final 12/05/2018 11:12 am) ---------------------------------------------------------------------- Patient Info  ID #:        [id number]                          D.O.B.:  [birth year] (23 yrs)  Name:       Renee Velez                     Visit Date: 12/05/2018 08:24 am ---------------------------------------------------------------------- Performed By  Performed By:     Hurman Horn          Ref. Address:     [address]                                                             [place], [place]  [address]  Attending:        Noralee Space MD        [facility]  Referred By:      Tereso Newcomer MD ---------------------------------------------------------------------- Orders   #  Description                          Code         Ordered By   1  [place] MFM OB FOLLOW UP                  79892.11     MICHAEL ERVIN   2  [place] MFM UA CORD DOPPLER               76820.02     MICHAEL ERVIN   3  US FETAL BPP WO NON STRESS           76819.0      MICHAEL ERVIN  ----------------------------------------------------------------------   #  Order #                    Accession #                 Episode #   1  [id number]                  [id number]   2  [id number]                  [id number]   3  [id number]                  [id number]                  [id number]  ---------------------------------------------------------------------- Indications   Maternal care for known or suspected poor      O36.5930   fetal growth, third trimester, not applicable or   unspecified IUGR   [redacted] weeks gestation of pregnancy                Z3A.30   Poor obstetric history: Previous               O09.299   preeclampsia / eclampsia/gestational HTN   Rh negative state in antepartum                O36.0190   Bipolar disease during pregnancy                O99.340 F31.9   Seizure disorder                               O99.350 G40.909   Short interval between pregancies, 3rd  B01.751   trimester  ---------------------------------------------------------------------- Vital Signs                                                 Height:        5'4" ---------------------------------------------------------------------- Fetal Evaluation  Num Of Fetuses:         1  Fetal Heart Rate(bpm):  146  Cardiac Activity:       Observed  Presentation:           Cephalic  Placenta:               Posterior  P. Cord Insertion:      Visualized  Amniotic Fluid  AFI FV:      Within normal limits  AFI Sum(cm)     %Tile       Largest Pocket(cm)  11.32           25          4.09  RUQ(cm)       RLQ(cm)       LUQ(cm)        LLQ(cm)  3.12          2.41          4.09           1.7 ---------------------------------------------------------------------- Biophysical Evaluation  Amniotic F.V:   Within normal limits       F. Tone:        Observed  F. Movement:    Observed                   Score:          8/8  F. Breathing:   Observed ---------------------------------------------------------------------- Biometry  BPD:      69.7  mm     G. Age:  28w 0d        < 1  %    CI:        70.98   %    70 - 86                                                          FL/HC:      19.6   %    19.2 - 21.4  HC:      263.6  mm     G. Age:  28w 5d        < 1  %    HC/AC:      1.06        0.99 - 1.21  AC:      248.9  mm     G. Age:  29w 1d         12  %    FL/BPD:     74.2   %    71 - 87  FL:       51.7  mm     G. Age:  27w 4d        < 1  %    FL/AC:      20.8   %    20 - 24  HUM:      44.4  mm  G. Age:  26w 2d        < 5  %  Est. FW:    1237  gm    2 lb 12 oz       3  % ---------------------------------------------------------------------- OB History  Gravidity:    5         Term:   3        Prem:   0        SAB:   1  TOP:          0       Ectopic:  0        Living:  3 ---------------------------------------------------------------------- Gestational Age  LMP:           31w 6d        Date:  04/26/18                 EDD:   01/31/19  U/S Today:     28w 3d                                        EDD:   02/24/19  Best:          30w 3d     Det. ByMarcella Dubs         EDD:   02/10/19                                      (06/26/18) ---------------------------------------------------------------------- Anatomy  Cranium:               Appears normal         LVOT:                   Appears normal  Cavum:                 Appears normal         Aortic Arch:            Previously seen  Ventricles:            Appears normal         Ductal Arch:            Previously seen  Choroid Plexus:        Previously seen        Diaphragm:              Appears normal  Cerebellum:            Previously seen        Stomach:                Appears normal, left                                                                        sided  Posterior Fossa:       Previously seen        Abdomen:                Appears normal  Nuchal Fold:           Not applicable (>20    Abdominal Wall:         Previously seen                         wks GA)  Face:                  Orbits and profile     Cord Vessels:           Previously seen                         previously seen  Lips:                  Previously seen        Kidneys:                Appear normal  Palate:                Previously seen        Bladder:                Appears normal  Thoracic:              Appears normal         Spine:                  Previously seen  Heart:                 Appears normal         Upper Extremities:      Previously seen                         (4CH, axis, and                         situs)  RVOT:                  Appears normal         Lower Extremities:      Previously seen  Other:  Heels and 5th digit previously visualized. Parents do not wish to know          sex of fetus. Nasal bone  previously visualized. ---------------------------------------------------------------------- Doppler - Fetal Vessels  Umbilical Artery   S/D     %tile     RI              PI                     ADFV    RDFV  3.38       79     0.7            1.17                        No      No ---------------------------------------------------------------------- Impression  Severe fetal growth restriction.  Patient was admitted  yesterday with R/o acute pyelonephritis.  On ultrasound, the estimated fetal weight is at the 3rd  percentile (interval weight gain is 457 g over 3  weeks).Amniotic fluid is normal and good fetal activity is  seen. Antenatal testing is reassuring. BPP 8/8. Umbilical  artery Doppler showed normal forward diastolic flow. ---------------------------------------------------------------------- Recommendations  -  Continue weekly BPP and UA Doppler studies. ----------------------------------------------------------------------                  Noralee Space, MD Electronically Signed Final Report   12/05/2018 11:12 am  Component 3d ago  Specimen Description URINE, CLEAN CATCH  Performed at [facility], [address] [place], [place] [address]   Special Requests NONE  Performed at [facility], [address] [place], [place] [address]   Culture >=100,000 COLONIES/mL STAPHYLOCOCCUS SAPROPHYTICUSAbnormal    Report Status 12/06/2018 FINAL   Organism ID, Bacteria STAPHYLOCOCCUS SAPROPHYTICUSAbnormal      Treatments: IV hydration and antibiotics: Ancef  Discharge Exam: Blood pressure (!) 118/58, pulse 72, temperature 98.3 F (36.8 C), temperature source Oral, resp. rate 18, height (S)  (1.626 m), weight (S) 64.3 kg, last menstrual period 04/26/2018, SpO2 96 %, unknown if currently breastfeeding. General appearance: alert, cooperative and no distress GI: no CVAT  Disposition: Discharge disposition: 01-Home or Self  Care       Discharge Instructions    Discharge patient   Complete by: As directed    Discharge disposition: 01-Home or Self Care   Discharge patient date: 12/06/2018     Allergies as of 12/06/2018      Reactions   Nickel Rash      Medication List    TAKE these medications   aspirin EC 81 MG tablet Take 1 tablet (81 mg total) by mouth daily. Take after 12 weeks for prevention of preeclampsia later in pregnancy   cephALEXin 500 MG capsule Commonly known as: KEFLEX Take 1 capsule (500 mg total) by mouth 3 (three) times daily. What changed: when to take this   folic acid 1 MG tablet Commonly known as: FOLVITE Take 1 tablet (1 mg total) by mouth daily.   levETIRAcetam 1000 MG tablet Commonly known as: KEPPRA Take 1 tablet (1,000 mg total) by mouth 2 (two) times daily.   prenatal multivitamin Tabs tablet Take 1 tablet by mouth daily at 12 noon.      Follow-up Information    [facility] Follow up in 1 week(s).   Specialty: Obstetrics and Gynecology Contact information: [address] [place] [address], [address] [id number] mc [place] [address] [phone]          Signed: Scheryl Darter 12/06/2018, 10:17 AM

## 2018-12-06 NOTE — Discharge Instructions (Signed)
Intrauterine Growth Restriction ° °Intrauterine growth restriction (IUGR) is when a baby is not growing normally during pregnancy. A baby with IUGR is smaller than it should be and may weigh less than normal at birth. °IUGR can result from a problem with the organ that supplies the unborn baby (fetus) with oxygen and nutrition (placenta). Usually, there is no way to prevent this type of problem. Babies with IUGR are at higher risk for early delivery and needing special (intensive) care after birth. °What are the causes? °The most common cause of IUGR is a problem with the placenta or umbilical cord that causes the fetus to get less oxygen or nutrition than needed. Other causes include: °· The mother eating a very unhealthy diet (poor maternal nutrition). °· Exposure to chemicals found in substances such as cigarettes, alcohol, and some drugs. °· Some prescription medicines. °· Other problems that develop in the womb (congenital birth defects). °· Genetic disorders. °· Infection. °· Carrying more than one baby. °What increases the risk? °This condition is more likely to affect babies of mothers who: °· Are over the age of 35 at the time of delivery. °· Are younger than age 16 at the time of delivery. °· Have medical conditions such as high blood pressure, preeclampsia, diabetes, heart or kidney disease, systemic lupus erythematosus, or anemia. °· Live at a very high altitude during pregnancy. °· Have a personal history or family history of: °? IUGR. °? A genetic disorder. °· Take medicines during pregnancy that are related to congenital disabilities. °· Come into contact with infected cat feces (toxoplasmosis). °· Come into contact with chickenpox (varicella) or German measles (rubella). °· Have or are at risk of getting an infectious disease such as syphilis, HIV, or herpes. °· Eat an unhealthy diet during pregnancy. °· Weigh less than 100 pounds. °· Have had treatments to help her have children (infertility  treatments). °· Use tobacco, drugs, or alcohol during pregnancy. °What are the signs or symptoms? °IUGR does not cause many symptoms. You might notice that your baby does not move or kick very often. Also, your belly may not be as big as expected for the stage of your pregnancy. °How is this diagnosed? °This condition is diagnosed with physical exams and prenatal exams. You may also have: °· Fundal measurements to check the size of your uterus. °· An ultrasound done to measure your baby's size compared to the size of other babies at the same stage of development (gestational age). Your health care provider will monitor your baby's growth with ultrasounds throughout pregnancy. °You may also have tests to find the cause of IUGR. These may include: °· Amniocentesis. This is a procedure that involves passing a needle into the uterus to collect a sample of fluid that surrounds the fetus (amniotic fluid). This may be done to check for signs of infection or congenital defects. °· Tests to evaluate blood flow to your baby and placenta. °How is this treated? °In most cases, the goal of treatment is to treat the cause of IUGR. Your health care providers will monitor your pregnancy closely and help you manage your pregnancy. °If your condition is caused by a placenta problem and your baby is not getting enough blood, you may need: °· Medicine to start labor and deliver your baby early (induction). °· Cesarean delivery, also called a C-section. In this procedure, your baby is delivered through an incision in your abdomen and uterus. °Follow these instructions at home: °Medicines °· Take over-the-counter and prescription medicines   only as told by your health care provider. This includes vitamins and supplements. °· Make sure that your health care provider knows about and approves of all the medicines, supplements, vitamins, eye drops, and creams that you use. °General instructions °· Eat a healthy diet that includes fresh fruits  and vegetables, lean proteins, whole grains, and calcium-rich foods such as milk, yogurt, and dark, leafy greens. Work with your health care provider or a dietitian to make sure that: °? You are getting enough nutrients. °? You are gaining enough weight. °· Rest as needed. Try to get at least 8 hours of sleep every night. °· Do not drink alcohol or use drugs. °· Do not use any products that contain nicotine or tobacco, such as cigarettes and e-cigarettes. If you need help quitting, ask your health care provider. °· Keep all follow-up visits as told by your health care provider. This is important. °Get help right away if you: °· Notice that your baby is moving less than usual or is not moving. °· Have contractions that are 5 minutes or less apart, or that increase in frequency, intensity, or length. °· Have signs and symptoms of infection, including a fever. °· Have vaginal bleeding. °· Have increased swelling in your legs, hands, or face. °· Have vision changes, including seeing spots or having blurry or double vision. °· Have a severe headache that does not go away. °· Have sudden, sharp abdominal pain or low back pain. °· Have an uncontrolled gush or trickle of fluid from your vagina. °Summary °· Intrauterine growth restriction (IUGR) is when a baby is not growing normally during pregnancy. °· The most common cause of IUGR is a problem with the placenta or umbilical cord that causes the fetus to get less oxygen or nutrition than needed. °· This condition is diagnosed with physical and prenatal exams. Your health care provider will monitor your baby's growth with ultrasounds throughout pregnancy. °· Make sure that your health care provider knows about and approves of all the medicines, supplements, vitamins, eye drops, and creams that you use. °This information is not intended to replace advice given to you by your health care provider. Make sure you discuss any questions you have with your health care  provider. °Document Released: 10/29/2007 Document Revised: 01/01/2017 Document Reviewed: 11/19/2016 °Elsevier Patient Education © 2020 [employer] Inc[employer] ° °

## 2018-12-07 LAB — TYPE AND SCREEN
ABO/RH(D): A NEG
Antibody Screen: POSITIVE
Unit division: 0
Unit division: 0

## 2018-12-07 LAB — BPAM RBC
Blood Product Expiration Date: 202012062359
Blood Product Expiration Date: 202012062359
Unit Type and Rh: 9500
Unit Type and Rh: 9500

## 2018-12-12 ENCOUNTER — Ambulatory Visit (HOSPITAL_COMMUNITY): Payer: Medicaid Other

## 2018-12-12 ENCOUNTER — Other Ambulatory Visit: Payer: Self-pay

## 2018-12-12 ENCOUNTER — Ambulatory Visit (INDEPENDENT_AMBULATORY_CARE_PROVIDER_SITE_OTHER): Payer: Medicaid Other | Admitting: Family Medicine

## 2018-12-12 VITALS — BP 129/62 | HR 79 | Wt 140.9 lb

## 2018-12-12 DIAGNOSIS — Z8759 Personal history of other complications of pregnancy, childbirth and the puerperium: Secondary | ICD-10-CM

## 2018-12-12 DIAGNOSIS — F419 Anxiety disorder, unspecified: Secondary | ICD-10-CM | POA: Insufficient documentation

## 2018-12-12 DIAGNOSIS — F319 Bipolar disorder, unspecified: Secondary | ICD-10-CM

## 2018-12-12 DIAGNOSIS — G40909 Epilepsy, unspecified, not intractable, without status epilepticus: Secondary | ICD-10-CM

## 2018-12-12 DIAGNOSIS — O0993 Supervision of high risk pregnancy, unspecified, third trimester: Secondary | ICD-10-CM

## 2018-12-12 DIAGNOSIS — O99343 Other mental disorders complicating pregnancy, third trimester: Secondary | ICD-10-CM

## 2018-12-12 DIAGNOSIS — O2303 Infections of kidney in pregnancy, third trimester: Secondary | ICD-10-CM

## 2018-12-12 DIAGNOSIS — O365930 Maternal care for other known or suspected poor fetal growth, third trimester, not applicable or unspecified: Secondary | ICD-10-CM

## 2018-12-12 DIAGNOSIS — O99353 Diseases of the nervous system complicating pregnancy, third trimester: Secondary | ICD-10-CM

## 2018-12-12 DIAGNOSIS — O0990 Supervision of high risk pregnancy, unspecified, unspecified trimester: Secondary | ICD-10-CM

## 2018-12-12 DIAGNOSIS — Z87440 Personal history of urinary (tract) infections: Secondary | ICD-10-CM

## 2018-12-12 DIAGNOSIS — Z3A31 31 weeks gestation of pregnancy: Secondary | ICD-10-CM

## 2018-12-12 MED ORDER — NITROFURANTOIN MONOHYD MACRO 100 MG PO CAPS: 100.0000 mg | ORAL_CAPSULE | Freq: Every day | ORAL | 3 refills | Status: DC

## 2018-12-12 MED ORDER — CITALOPRAM HYDROBROMIDE 20 MG PO TABS: 20.0000 mg | ORAL_TABLET | Freq: Every day | ORAL | 12 refills | Status: DC

## 2018-12-12 NOTE — Progress Notes (Signed)
   PRENATAL VISIT NOTE  Subjective:  Renee Velez is a 23 y.o. W2B7628 at [redacted]w[redacted]d being seen today for ongoing prenatal care.  She is currently monitored for the following issues for this high-risk pregnancy and has Seizure disorder ([place]); Bipolar 1 disorder ([place]); Tobacco use disorder; History of gestational hypertension; Nausea and vomiting during pregnancy; Supervision of high risk pregnancy, antepartum; Rh negative, antepartum; Pregnancy affected by fetal growth restriction; History of pyelonephritis during pregnancy; Pyelonephritis affecting pregnancy in third trimester; and Anxiety on their problem list.  Patient reports having increased anxiety with panic attacks. Has h/o bipolar 1..  Contractions: Irritability. Vag. Bleeding: None.  Movement: Present. Denies leaking of fluid.   The following portions of the patient's history were reviewed and updated as appropriate: allergies, current medications, past family history, past medical history, past social history, past surgical history and problem list.   Objective:   Vitals:   12/12/18 1059  BP: 129/62  Pulse: 79  Weight: 140 lb 14.4 oz (63.9 kg)    Fetal Status: Fetal Heart Rate (bpm): 147 Fundal Height: 27 cm Movement: Present     General:  Alert, oriented and cooperative. Patient is in no acute distress.  Skin: Skin is warm and dry. No rash noted.   Cardiovascular: Normal heart rate noted  Respiratory: Normal respiratory effort, no problems with respiration noted  Abdomen: Soft, gravid, appropriate for gestational age.  Pain/Pressure: Present     Pelvic: Cervical exam deferred        Extremities: Normal range of motion.  Edema: None  Mental Status: Normal mood and affect. Normal behavior. Normal judgment and thought content.   Assessment and Plan:  Pregnancy: B1D1761 at [redacted]w[redacted]d 1. Bipolar 1 disorder ([place]) Will refer to psychiatry. - Ambulatory referral to Psychiatry  2. Anxiety  - Ambulatory referral to Psychiatry  3.  Supervision of high risk pregnancy, antepartum FHT and FH normal  4. Poor fetal growth affecting management of mother in third trimester, single or unspecified fetus Continue BPP and dopplers with growth [place]  5. History of pyelonephritis during pregnancy Start suppression with macrobid 100mg  qhs  6. Pyelonephritis affecting pregnancy in third trimester   7. Seizure disorder ([place]) Continue keppra with folic acid supplementation  Preterm labor symptoms and general obstetric precautions including but not limited to vaginal bleeding, contractions, leaking of fluid and fetal movement were reviewed in detail with the patient. Please refer to After Visit Summary for other counseling recommendations.   Return in about 2 weeks (around 12/26/2018) for HR OB f/u.  Future Appointments  Date Time Provider [place]  12/23/2018  9:00 AM [place] [facility] MFC-US  12/23/2018  9:00 AM [place] 3 WH-MFCUS MFC-US  01/02/2019  1:35 PM [place] Mode, MD [place], DO

## 2018-12-23 ENCOUNTER — Other Ambulatory Visit (HOSPITAL_COMMUNITY): Payer: Self-pay | Admitting: *Deleted

## 2018-12-23 ENCOUNTER — Encounter (HOSPITAL_COMMUNITY): Payer: Self-pay

## 2018-12-23 ENCOUNTER — Other Ambulatory Visit: Payer: Self-pay

## 2018-12-23 ENCOUNTER — Ambulatory Visit (HOSPITAL_COMMUNITY): Payer: Medicaid Other | Admitting: *Deleted

## 2018-12-23 ENCOUNTER — Ambulatory Visit (HOSPITAL_COMMUNITY)
Admission: RE | Admit: 2018-12-23 | Discharge: 2018-12-23 | Disposition: A | Discharge status: Home / Self Care | Payer: Medicaid Other | Source: Ambulatory Visit | Attending: Obstetrics | Admitting: Obstetrics

## 2018-12-23 DIAGNOSIS — Z6791 Unspecified blood type, Rh negative: Secondary | ICD-10-CM | POA: Diagnosis present

## 2018-12-23 DIAGNOSIS — O26899 Other specified pregnancy related conditions, unspecified trimester: Secondary | ICD-10-CM | POA: Diagnosis present

## 2018-12-23 DIAGNOSIS — Z3A33 33 weeks gestation of pregnancy: Secondary | ICD-10-CM

## 2018-12-23 DIAGNOSIS — O99353 Diseases of the nervous system complicating pregnancy, third trimester: Secondary | ICD-10-CM

## 2018-12-23 DIAGNOSIS — O360190 Maternal care for anti-D [Rh] antibodies, unspecified trimester, not applicable or unspecified: Secondary | ICD-10-CM | POA: Diagnosis not present

## 2018-12-23 DIAGNOSIS — O09293 Supervision of pregnancy with other poor reproductive or obstetric history, third trimester: Secondary | ICD-10-CM | POA: Diagnosis not present

## 2018-12-23 DIAGNOSIS — O99343 Other mental disorders complicating pregnancy, third trimester: Secondary | ICD-10-CM

## 2018-12-23 DIAGNOSIS — G40909 Epilepsy, unspecified, not intractable, without status epilepticus: Secondary | ICD-10-CM

## 2018-12-23 DIAGNOSIS — O365990 Maternal care for other known or suspected poor fetal growth, unspecified trimester, not applicable or unspecified: Secondary | ICD-10-CM | POA: Diagnosis present

## 2018-12-23 DIAGNOSIS — O365930 Maternal care for other known or suspected poor fetal growth, third trimester, not applicable or unspecified: Secondary | ICD-10-CM

## 2018-12-23 DIAGNOSIS — O09893 Supervision of other high risk pregnancies, third trimester: Secondary | ICD-10-CM

## 2018-12-23 DIAGNOSIS — F319 Bipolar disorder, unspecified: Secondary | ICD-10-CM

## 2019-01-02 ENCOUNTER — Other Ambulatory Visit: Payer: Self-pay

## 2019-01-02 ENCOUNTER — Ambulatory Visit (HOSPITAL_COMMUNITY): Payer: Medicaid Other | Admitting: *Deleted

## 2019-01-02 ENCOUNTER — Ambulatory Visit (HOSPITAL_COMMUNITY)
Admission: RE | Admit: 2019-01-02 | Discharge: 2019-01-02 | Disposition: A | Discharge status: Home / Self Care | Payer: Medicaid Other | Source: Ambulatory Visit | Attending: Obstetrics and Gynecology | Admitting: Obstetrics and Gynecology

## 2019-01-02 ENCOUNTER — Encounter (HOSPITAL_COMMUNITY): Payer: Self-pay | Admitting: *Deleted

## 2019-01-02 ENCOUNTER — Ambulatory Visit (INDEPENDENT_AMBULATORY_CARE_PROVIDER_SITE_OTHER): Payer: Medicaid Other | Admitting: Obstetrics & Gynecology

## 2019-01-02 VITALS — BP 120/61 | HR 84 | Wt 144.9 lb

## 2019-01-02 DIAGNOSIS — F319 Bipolar disorder, unspecified: Secondary | ICD-10-CM

## 2019-01-02 DIAGNOSIS — O365990 Maternal care for other known or suspected poor fetal growth, unspecified trimester, not applicable or unspecified: Secondary | ICD-10-CM

## 2019-01-02 DIAGNOSIS — O09893 Supervision of other high risk pregnancies, third trimester: Secondary | ICD-10-CM

## 2019-01-02 DIAGNOSIS — O99343 Other mental disorders complicating pregnancy, third trimester: Secondary | ICD-10-CM | POA: Diagnosis not present

## 2019-01-02 DIAGNOSIS — O99353 Diseases of the nervous system complicating pregnancy, third trimester: Secondary | ICD-10-CM

## 2019-01-02 DIAGNOSIS — Z6791 Unspecified blood type, Rh negative: Secondary | ICD-10-CM

## 2019-01-02 DIAGNOSIS — O360130 Maternal care for anti-D [Rh] antibodies, third trimester, not applicable or unspecified: Secondary | ICD-10-CM

## 2019-01-02 DIAGNOSIS — O09293 Supervision of pregnancy with other poor reproductive or obstetric history, third trimester: Secondary | ICD-10-CM | POA: Diagnosis not present

## 2019-01-02 DIAGNOSIS — O26899 Other specified pregnancy related conditions, unspecified trimester: Secondary | ICD-10-CM | POA: Insufficient documentation

## 2019-01-02 DIAGNOSIS — O365930 Maternal care for other known or suspected poor fetal growth, third trimester, not applicable or unspecified: Secondary | ICD-10-CM | POA: Insufficient documentation

## 2019-01-02 DIAGNOSIS — O0990 Supervision of high risk pregnancy, unspecified, unspecified trimester: Secondary | ICD-10-CM

## 2019-01-02 DIAGNOSIS — G40909 Epilepsy, unspecified, not intractable, without status epilepticus: Secondary | ICD-10-CM

## 2019-01-02 DIAGNOSIS — O365931 Maternal care for other known or suspected poor fetal growth, third trimester, fetus 1: Secondary | ICD-10-CM

## 2019-01-02 DIAGNOSIS — O0993 Supervision of high risk pregnancy, unspecified, third trimester: Secondary | ICD-10-CM

## 2019-01-02 DIAGNOSIS — Z3A34 34 weeks gestation of pregnancy: Secondary | ICD-10-CM

## 2019-01-02 NOTE — Patient Instructions (Signed)

## 2019-01-02 NOTE — Progress Notes (Signed)
   PRENATAL VISIT NOTE  Subjective:  Renee Velez is a 23 y.o. X9B7169 at [redacted]w[redacted]d being seen today for ongoing prenatal care.  She is currently monitored for the following issues for this high-risk pregnancy and has Seizure disorder ([place]); Bipolar 1 disorder ([place]); Tobacco use disorder; History of gestational hypertension; Nausea and vomiting during pregnancy; Supervision of high risk pregnancy, antepartum; Rh negative, antepartum; Pregnancy affected by fetal growth restriction; History of pyelonephritis during pregnancy; Pyelonephritis affecting pregnancy in third trimester; and Anxiety on their problem list.  Patient reports no complaints.  Contractions: Not present. Vag. Bleeding: None.  Movement: Present. Denies leaking of fluid.   The following portions of the patient's history were reviewed and updated as appropriate: allergies, current medications, past family history, past medical history, past social history, past surgical history and problem list.   Objective:   Vitals:   01/02/19 1329  BP: 120/61  Pulse: 84  Weight: 144 lb 14.4 oz (65.7 kg)    Fetal Status: Fetal Heart Rate (bpm): 143   Movement: Present     General:  Alert, oriented and cooperative. Patient is in no acute distress.  Skin: Skin is warm and dry. No rash noted.   Cardiovascular: Normal heart rate noted  Respiratory: Normal respiratory effort, no problems with respiration noted  Abdomen: Soft, gravid, appropriate for gestational age.  Pain/Pressure: Present     Pelvic: Cervical exam deferred        Extremities: Normal range of motion.  Edema: Trace  Mental Status: Normal mood and affect. Normal behavior. Normal judgment and thought content.   Assessment and Plan:  Pregnancy: [id number] at [redacted]w[redacted]d 1. Supervision of high risk pregnancy, antepartum [place] f/utoday  2. Pregnancy affected by fetal growth restriction BPP 8/8 last visit  Preterm labor symptoms and general obstetric precautions including but not limited to  vaginal bleeding, contractions, leaking of fluid and fetal movement were reviewed in detail with the patient. Please refer to After Visit Summary for other counseling recommendations.   Return in about 1 week (around 01/09/2019).  Future Appointments  Date Time Provider [place]  01/02/2019  2:30 PM [place] [facility] M[facility]-US  01/02/2019  2:30 PM [place] 5 WH-MFCUS MFC-US  01/09/2019 11:30 AM WH-MFC NURSE WH-MFC MFC-US  01/09/2019 11:30 AM WH-MFC [place] 1 WH-MFCUS MFC-US  01/16/2019  9:45 AM WH-MFC NURSE WH-MFC MFC-US  01/16/2019  9:45 AM WH-MFC [place] 2 WH-MFCUS MFC-US    Emeterio Reeve, MD

## 2019-01-09 ENCOUNTER — Other Ambulatory Visit (INDEPENDENT_AMBULATORY_CARE_PROVIDER_SITE_OTHER): Payer: Medicaid Other | Admitting: Obstetrics & Gynecology

## 2019-01-09 ENCOUNTER — Other Ambulatory Visit: Payer: Self-pay

## 2019-01-09 ENCOUNTER — Ambulatory Visit (HOSPITAL_COMMUNITY): Payer: Medicaid Other | Admitting: *Deleted

## 2019-01-09 ENCOUNTER — Other Ambulatory Visit (HOSPITAL_COMMUNITY): Payer: Self-pay | Admitting: Obstetrics and Gynecology

## 2019-01-09 ENCOUNTER — Ambulatory Visit (HOSPITAL_COMMUNITY)
Admission: RE | Admit: 2019-01-09 | Discharge: 2019-01-09 | Disposition: A | Discharge status: Home / Self Care | Payer: Medicaid Other | Source: Ambulatory Visit | Attending: Obstetrics and Gynecology | Admitting: Obstetrics and Gynecology

## 2019-01-09 ENCOUNTER — Encounter (HOSPITAL_COMMUNITY): Payer: Self-pay

## 2019-01-09 ENCOUNTER — Other Ambulatory Visit (HOSPITAL_COMMUNITY)
Admission: RE | Admit: 2019-01-09 | Discharge: 2019-01-09 | Disposition: A | Discharge status: Home / Self Care | Payer: Medicaid Other | Source: Ambulatory Visit | Attending: Obstetrics & Gynecology | Admitting: Obstetrics & Gynecology

## 2019-01-09 ENCOUNTER — Ambulatory Visit: Payer: Medicaid Other | Admitting: Obstetrics & Gynecology

## 2019-01-09 VITALS — BP 125/75 | HR 87 | Wt 148.0 lb

## 2019-01-09 DIAGNOSIS — O365930 Maternal care for other known or suspected poor fetal growth, third trimester, not applicable or unspecified: Secondary | ICD-10-CM | POA: Diagnosis present

## 2019-01-09 DIAGNOSIS — O0990 Supervision of high risk pregnancy, unspecified, unspecified trimester: Secondary | ICD-10-CM | POA: Diagnosis present

## 2019-01-09 DIAGNOSIS — G40909 Epilepsy, unspecified, not intractable, without status epilepticus: Secondary | ICD-10-CM

## 2019-01-09 DIAGNOSIS — O365990 Maternal care for other known or suspected poor fetal growth, unspecified trimester, not applicable or unspecified: Secondary | ICD-10-CM

## 2019-01-09 DIAGNOSIS — F319 Bipolar disorder, unspecified: Secondary | ICD-10-CM

## 2019-01-09 DIAGNOSIS — O09293 Supervision of pregnancy with other poor reproductive or obstetric history, third trimester: Secondary | ICD-10-CM | POA: Diagnosis not present

## 2019-01-09 DIAGNOSIS — Z6791 Unspecified blood type, Rh negative: Secondary | ICD-10-CM | POA: Diagnosis present

## 2019-01-09 DIAGNOSIS — Z3A35 35 weeks gestation of pregnancy: Secondary | ICD-10-CM

## 2019-01-09 DIAGNOSIS — O99343 Other mental disorders complicating pregnancy, third trimester: Secondary | ICD-10-CM

## 2019-01-09 DIAGNOSIS — O365993 Maternal care for other known or suspected poor fetal growth, unspecified trimester, fetus 3: Secondary | ICD-10-CM

## 2019-01-09 DIAGNOSIS — O360130 Maternal care for anti-D [Rh] antibodies, third trimester, not applicable or unspecified: Secondary | ICD-10-CM

## 2019-01-09 DIAGNOSIS — O26899 Other specified pregnancy related conditions, unspecified trimester: Secondary | ICD-10-CM | POA: Diagnosis present

## 2019-01-09 DIAGNOSIS — O09893 Supervision of other high risk pregnancies, third trimester: Secondary | ICD-10-CM

## 2019-01-09 DIAGNOSIS — O99353 Diseases of the nervous system complicating pregnancy, third trimester: Secondary | ICD-10-CM

## 2019-01-09 NOTE — Progress Notes (Signed)
   PRENATAL VISIT NOTE  Subjective:  Renee Velez is a 23 y.o. [id number] at [redacted]w[redacted]d being seen today for ongoing prenatal care.  She is currently monitored for the following issues for this high-risk pregnancy and has Seizure disorder ([place]); Bipolar 1 disorder ([place]); Tobacco use disorder; History of gestational hypertension; Nausea and vomiting during pregnancy; Supervision of high risk pregnancy, antepartum; Rh negative, antepartum; Pregnancy affected by fetal growth restriction; History of pyelonephritis during pregnancy; Pyelonephritis affecting pregnancy in third trimester; and Anxiety on their problem list.  Patient reports no complaints.  Contractions: Irregular. Vag. Bleeding: None.  Movement: Present. Denies leaking of fluid.   The following portions of the patient's history were reviewed and updated as appropriate: allergies, current medications, past family history, past medical history, past social history, past surgical history and problem list.   Objective:   Vitals:   01/09/19 1604  BP: 125/75  Pulse: 87  Weight: 148 lb (67.1 kg)    Fetal Status: Fetal Heart Rate (bpm): 154   Movement: Present     General:  Alert, oriented and cooperative. Patient is in no acute distress.  Skin: Skin is warm and dry. No rash noted.   Cardiovascular: Normal heart rate noted  Respiratory: Normal respiratory effort, no problems with respiration noted  Abdomen: Soft, gravid, appropriate for gestational age.  Pain/Pressure: Absent     Pelvic: Cervical exam performed        Extremities: Normal range of motion.  Edema: Trace  Mental Status: Normal mood and affect. Normal behavior. Normal judgment and thought content.   Assessment and Plan:  Pregnancy: [id number] at [redacted]w[redacted]d 1. Pregnancy affected by fetal growth restriction routine - Culture, beta strep (group b only) - Cervicovaginal ancillary only( [place])  2. Supervision of high risk pregnancy, antepartum IOL per MFM 37 weeks - Culture, beta  strep (group b only) - Cervicovaginal ancillary only( [place])  Preterm labor symptoms and general obstetric precautions including but not limited to vaginal bleeding, contractions, leaking of fluid and fetal movement were reviewed in detail with the patient. Please refer to After Visit Summary for other counseling recommendations.   Return in about 1 week (around 01/16/2019).  Future Appointments  Date Time Provider [place]  01/16/2019  9:45 AM [place] NURSE [place] [facility]-US  01/16/2019  9:45 AM [place] 2 WH-MFCUS M[facility]-US  01/17/2019 11:15 AM Ardean Larsen, Mervyn Skeeters, CNM WOC-WOCA [place]  01/20/2019 12:15 AM [facility]-LD [place] ROOM [facility]-INDC None    Emeterio Reeve, MD

## 2019-01-09 NOTE — Patient Instructions (Signed)

## 2019-01-11 ENCOUNTER — Telehealth (HOSPITAL_COMMUNITY): Payer: Self-pay | Admitting: *Deleted

## 2019-01-11 LAB — CERVICOVAGINAL ANCILLARY ONLY
Chlamydia: NEGATIVE
Comment: NEGATIVE
Comment: NORMAL
Neisseria Gonorrhea: NEGATIVE

## 2019-01-11 NOTE — Telephone Encounter (Signed)
Preadmission screen  

## 2019-01-12 ENCOUNTER — Telehealth (HOSPITAL_COMMUNITY): Payer: Self-pay | Admitting: *Deleted

## 2019-01-12 NOTE — Telephone Encounter (Signed)
Preadmission screen  

## 2019-01-13 ENCOUNTER — Telehealth (HOSPITAL_COMMUNITY): Payer: Self-pay | Admitting: *Deleted

## 2019-01-13 NOTE — Telephone Encounter (Signed)
Preadmission screen  

## 2019-01-14 LAB — CULTURE, BETA STREP (GROUP B ONLY): Strep Gp B Culture: NEGATIVE

## 2019-01-16 ENCOUNTER — Telehealth (HOSPITAL_COMMUNITY): Payer: Self-pay | Admitting: *Deleted

## 2019-01-16 ENCOUNTER — Encounter (HOSPITAL_COMMUNITY): Payer: Self-pay

## 2019-01-16 ENCOUNTER — Ambulatory Visit (HOSPITAL_COMMUNITY): Payer: Medicaid Other | Admitting: *Deleted

## 2019-01-16 ENCOUNTER — Other Ambulatory Visit: Payer: Self-pay

## 2019-01-16 ENCOUNTER — Ambulatory Visit (HOSPITAL_COMMUNITY)
Admission: RE | Admit: 2019-01-16 | Discharge: 2019-01-16 | Disposition: A | Discharge status: Home / Self Care | Payer: Medicaid Other | Source: Ambulatory Visit | Attending: Obstetrics and Gynecology | Admitting: Obstetrics and Gynecology

## 2019-01-16 DIAGNOSIS — F319 Bipolar disorder, unspecified: Secondary | ICD-10-CM

## 2019-01-16 DIAGNOSIS — G40909 Epilepsy, unspecified, not intractable, without status epilepticus: Secondary | ICD-10-CM | POA: Diagnosis not present

## 2019-01-16 DIAGNOSIS — O09893 Supervision of other high risk pregnancies, third trimester: Secondary | ICD-10-CM

## 2019-01-16 DIAGNOSIS — O365990 Maternal care for other known or suspected poor fetal growth, unspecified trimester, not applicable or unspecified: Secondary | ICD-10-CM | POA: Diagnosis present

## 2019-01-16 DIAGNOSIS — O99353 Diseases of the nervous system complicating pregnancy, third trimester: Secondary | ICD-10-CM | POA: Diagnosis not present

## 2019-01-16 DIAGNOSIS — Z3A36 36 weeks gestation of pregnancy: Secondary | ICD-10-CM

## 2019-01-16 DIAGNOSIS — O360130 Maternal care for anti-D [Rh] antibodies, third trimester, not applicable or unspecified: Secondary | ICD-10-CM

## 2019-01-16 DIAGNOSIS — O26899 Other specified pregnancy related conditions, unspecified trimester: Secondary | ICD-10-CM | POA: Diagnosis present

## 2019-01-16 DIAGNOSIS — O99343 Other mental disorders complicating pregnancy, third trimester: Secondary | ICD-10-CM

## 2019-01-16 DIAGNOSIS — Z6791 Unspecified blood type, Rh negative: Secondary | ICD-10-CM

## 2019-01-16 DIAGNOSIS — O365930 Maternal care for other known or suspected poor fetal growth, third trimester, not applicable or unspecified: Secondary | ICD-10-CM | POA: Diagnosis not present

## 2019-01-16 DIAGNOSIS — O09293 Supervision of pregnancy with other poor reproductive or obstetric history, third trimester: Secondary | ICD-10-CM | POA: Diagnosis not present

## 2019-01-16 NOTE — Telephone Encounter (Signed)
Preadmission screen  

## 2019-01-17 ENCOUNTER — Telehealth (HOSPITAL_COMMUNITY): Payer: Self-pay | Admitting: *Deleted

## 2019-01-17 ENCOUNTER — Encounter: Payer: Self-pay | Admitting: General Practice

## 2019-01-17 ENCOUNTER — Encounter: Payer: Medicaid Other | Admitting: Student

## 2019-01-17 NOTE — Telephone Encounter (Signed)
Preadmission screen  

## 2019-01-18 ENCOUNTER — Telehealth (HOSPITAL_COMMUNITY): Payer: Self-pay | Admitting: *Deleted

## 2019-01-18 ENCOUNTER — Other Ambulatory Visit: Payer: Self-pay | Admitting: Women's Health

## 2019-01-18 ENCOUNTER — Other Ambulatory Visit (HOSPITAL_COMMUNITY): Admission: RE | Admit: 2019-01-18 | Payer: Medicaid Other | Source: Ambulatory Visit

## 2019-01-18 NOTE — Telephone Encounter (Signed)
Preadmission screen  

## 2019-01-20 ENCOUNTER — Inpatient Hospital Stay (HOSPITAL_COMMUNITY): Payer: Medicaid Other

## 2019-01-20 LAB — SARS CORONAVIRUS 2 (TAT 6-24 HRS): SARS Coronavirus 2: NEGATIVE

## 2019-01-20 NOTE — MAU Note (Signed)
COVID SWAB COLLECTED AND SENT TO LAB L&D NOTIFIED.

## 2019-01-21 ENCOUNTER — Other Ambulatory Visit: Payer: Self-pay

## 2019-01-21 ENCOUNTER — Inpatient Hospital Stay (HOSPITAL_COMMUNITY): Payer: Medicaid Other | Admitting: Anesthesiology

## 2019-01-21 ENCOUNTER — Inpatient Hospital Stay (HOSPITAL_COMMUNITY)
Admission: AD | Admit: 2019-01-21 | Discharge: 2019-01-23 | DRG: 806 | Disposition: A | Discharge status: Home / Self Care | Payer: Medicaid Other | Attending: Obstetrics and Gynecology | Admitting: Obstetrics and Gynecology

## 2019-01-21 ENCOUNTER — Inpatient Hospital Stay (HOSPITAL_COMMUNITY): Payer: Medicaid Other

## 2019-01-21 ENCOUNTER — Encounter (HOSPITAL_COMMUNITY): Payer: Self-pay | Admitting: Obstetrics & Gynecology

## 2019-01-21 DIAGNOSIS — G40909 Epilepsy, unspecified, not intractable, without status epilepticus: Secondary | ICD-10-CM | POA: Diagnosis present

## 2019-01-21 DIAGNOSIS — O134 Gestational [pregnancy-induced] hypertension without significant proteinuria, complicating childbirth: Secondary | ICD-10-CM

## 2019-01-21 DIAGNOSIS — F319 Bipolar disorder, unspecified: Secondary | ICD-10-CM | POA: Diagnosis present

## 2019-01-21 DIAGNOSIS — O365930 Maternal care for other known or suspected poor fetal growth, third trimester, not applicable or unspecified: Secondary | ICD-10-CM | POA: Diagnosis present

## 2019-01-21 DIAGNOSIS — O99344 Other mental disorders complicating childbirth: Secondary | ICD-10-CM | POA: Diagnosis present

## 2019-01-21 DIAGNOSIS — Z3A37 37 weeks gestation of pregnancy: Secondary | ICD-10-CM

## 2019-01-21 DIAGNOSIS — O365990 Maternal care for other known or suspected poor fetal growth, unspecified trimester, not applicable or unspecified: Secondary | ICD-10-CM | POA: Diagnosis present

## 2019-01-21 DIAGNOSIS — O26893 Other specified pregnancy related conditions, third trimester: Secondary | ICD-10-CM | POA: Diagnosis present

## 2019-01-21 DIAGNOSIS — Z20828 Contact with and (suspected) exposure to other viral communicable diseases: Secondary | ICD-10-CM | POA: Diagnosis present

## 2019-01-21 DIAGNOSIS — O99354 Diseases of the nervous system complicating childbirth: Secondary | ICD-10-CM | POA: Diagnosis present

## 2019-01-21 DIAGNOSIS — O99334 Smoking (tobacco) complicating childbirth: Secondary | ICD-10-CM | POA: Diagnosis present

## 2019-01-21 DIAGNOSIS — Z6791 Unspecified blood type, Rh negative: Secondary | ICD-10-CM

## 2019-01-21 DIAGNOSIS — F17210 Nicotine dependence, cigarettes, uncomplicated: Secondary | ICD-10-CM | POA: Diagnosis present

## 2019-01-21 DIAGNOSIS — O9902 Anemia complicating childbirth: Secondary | ICD-10-CM | POA: Diagnosis present

## 2019-01-21 DIAGNOSIS — D649 Anemia, unspecified: Secondary | ICD-10-CM | POA: Diagnosis present

## 2019-01-21 DIAGNOSIS — O26899 Other specified pregnancy related conditions, unspecified trimester: Secondary | ICD-10-CM

## 2019-01-21 LAB — CBC
HCT: 28.2 % — ABNORMAL LOW (ref 36.0–46.0)
Hemoglobin: 9.3 g/dL — ABNORMAL LOW (ref 12.0–15.0)
MCH: 29.2 pg (ref 26.0–34.0)
MCHC: 33 g/dL (ref 30.0–36.0)
MCV: 88.7 fL (ref 80.0–100.0)
Platelets: 263 10*3/uL (ref 150–400)
RBC: 3.18 MIL/uL — ABNORMAL LOW (ref 3.87–5.11)
RDW: 15.3 % (ref 11.5–15.5)
WBC: 12.8 10*3/uL — ABNORMAL HIGH (ref 4.0–10.5)
nRBC: 0 % (ref 0.0–0.2)

## 2019-01-21 LAB — RPR: RPR Ser Ql: NONREACTIVE

## 2019-01-21 MED ORDER — PRENATAL MULTIVITAMIN CH
1.0000 | ORAL_TABLET | Freq: Every day | ORAL | Status: DC
  Administered 2019-01-22 – 2019-01-23 (×2): 1 via ORAL
  Filled 2019-01-21 (×2): qty 1

## 2019-01-21 MED ORDER — OXYCODONE-ACETAMINOPHEN 5-325 MG PO TABS: 2.0000 | ORAL_TABLET | ORAL | Status: DC | PRN

## 2019-01-21 MED ORDER — DIPHENHYDRAMINE HCL 25 MG PO CAPS: 25.0000 mg | ORAL_CAPSULE | Freq: Four times a day (QID) | ORAL | Status: DC | PRN

## 2019-01-21 MED ORDER — OXYCODONE HCL 5 MG PO TABS: 10.0000 mg | ORAL_TABLET | ORAL | Status: DC | PRN

## 2019-01-21 MED ORDER — ONDANSETRON HCL 4 MG/2ML IJ SOLN
4.0000 mg | Freq: Four times a day (QID) | INTRAMUSCULAR | Status: DC | PRN
  Administered 2019-01-21: 4 mg via INTRAVENOUS
  Filled 2019-01-21: qty 2

## 2019-01-21 MED ORDER — SENNOSIDES-DOCUSATE SODIUM 8.6-50 MG PO TABS
2.0000 | ORAL_TABLET | ORAL | Status: DC
  Filled 2019-01-21: qty 2

## 2019-01-21 MED ORDER — MISOPROSTOL 50MCG HALF TABLET
50.0000 ug | ORAL_TABLET | ORAL | Status: DC | PRN
  Administered 2019-01-21: 50 ug via ORAL
  Filled 2019-01-21: qty 1

## 2019-01-21 MED ORDER — PHENYLEPHRINE 40 MCG/ML (10ML) SYRINGE FOR IV PUSH (FOR BLOOD PRESSURE SUPPORT): 80.0000 ug | PREFILLED_SYRINGE | INTRAVENOUS | Status: DC | PRN

## 2019-01-21 MED ORDER — ONDANSETRON HCL 4 MG/2ML IJ SOLN: 4.0000 mg | INTRAMUSCULAR | Status: DC | PRN

## 2019-01-21 MED ORDER — OXYTOCIN BOLUS FROM INFUSION
500.0000 mL | Freq: Once | INTRAVENOUS | Status: AC
  Administered 2019-01-21: 500 mL via INTRAVENOUS

## 2019-01-21 MED ORDER — TERBUTALINE SULFATE 1 MG/ML IJ SOLN: 0.2500 mg | Freq: Once | INTRAMUSCULAR | Status: DC | PRN

## 2019-01-21 MED ORDER — ONDANSETRON HCL 4 MG PO TABS: 4.0000 mg | ORAL_TABLET | ORAL | Status: DC | PRN

## 2019-01-21 MED ORDER — PRENATAL MULTIVITAMIN CH: 1.0000 | ORAL_TABLET | Freq: Every day | ORAL | Status: DC

## 2019-01-21 MED ORDER — IBUPROFEN 600 MG PO TABS
600.0000 mg | ORAL_TABLET | Freq: Four times a day (QID) | ORAL | Status: DC
  Administered 2019-01-21 – 2019-01-23 (×8): 600 mg via ORAL
  Filled 2019-01-21 (×8): qty 1

## 2019-01-21 MED ORDER — OXYCODONE HCL 5 MG PO TABS: 5.0000 mg | ORAL_TABLET | ORAL | Status: DC | PRN

## 2019-01-21 MED ORDER — BENZOCAINE-MENTHOL 20-0.5 % EX AERO: 1.0000 "application " | INHALATION_SPRAY | CUTANEOUS | Status: DC | PRN

## 2019-01-21 MED ORDER — SOD CITRATE-CITRIC ACID 500-334 MG/5ML PO SOLN: 30.0000 mL | ORAL | Status: DC | PRN

## 2019-01-21 MED ORDER — DIPHENHYDRAMINE HCL 50 MG/ML IJ SOLN: 12.5000 mg | INTRAMUSCULAR | Status: DC | PRN

## 2019-01-21 MED ORDER — FOLIC ACID 1 MG PO TABS
1.0000 mg | ORAL_TABLET | Freq: Every day | ORAL | Status: DC
  Filled 2019-01-21: qty 1

## 2019-01-21 MED ORDER — NITROFURANTOIN MONOHYD MACRO 100 MG PO CAPS
100.0000 mg | ORAL_CAPSULE | Freq: Every day | ORAL | Status: DC
  Filled 2019-01-21: qty 1

## 2019-01-21 MED ORDER — COCONUT OIL OIL: 1.0000 "application " | TOPICAL_OIL | Status: DC | PRN

## 2019-01-21 MED ORDER — MISOPROSTOL 25 MCG QUARTER TABLET
25.0000 ug | ORAL_TABLET | ORAL | Status: DC | PRN
  Filled 2019-01-21: qty 1

## 2019-01-21 MED ORDER — WITCH HAZEL-GLYCERIN EX PADS: 1.0000 "application " | MEDICATED_PAD | CUTANEOUS | Status: DC | PRN

## 2019-01-21 MED ORDER — LIDOCAINE HCL (PF) 1 % IJ SOLN: 30.0000 mL | INTRAMUSCULAR | Status: DC | PRN

## 2019-01-21 MED ORDER — EPHEDRINE 5 MG/ML INJ: 10.0000 mg | INTRAVENOUS | Status: DC | PRN

## 2019-01-21 MED ORDER — ACETAMINOPHEN 325 MG PO TABS: 650.0000 mg | ORAL_TABLET | ORAL | Status: DC | PRN

## 2019-01-21 MED ORDER — OXYCODONE-ACETAMINOPHEN 5-325 MG PO TABS: 1.0000 | ORAL_TABLET | ORAL | Status: DC | PRN

## 2019-01-21 MED ORDER — LACTATED RINGERS IV SOLN: 500.0000 mL | INTRAVENOUS | Status: DC | PRN

## 2019-01-21 MED ORDER — SIMETHICONE 80 MG PO CHEW: 80.0000 mg | CHEWABLE_TABLET | ORAL | Status: DC | PRN

## 2019-01-21 MED ORDER — PHENYLEPHRINE 40 MCG/ML (10ML) SYRINGE FOR IV PUSH (FOR BLOOD PRESSURE SUPPORT)
80.0000 ug | PREFILLED_SYRINGE | INTRAVENOUS | Status: DC | PRN
  Filled 2019-01-21: qty 10

## 2019-01-21 MED ORDER — OXYTOCIN 40 UNITS IN NORMAL SALINE INFUSION - SIMPLE MED
2.5000 [IU]/h | INTRAVENOUS | Status: DC
  Filled 2019-01-21: qty 1000

## 2019-01-21 MED ORDER — SODIUM CHLORIDE (PF) 0.9 % IJ SOLN
INTRAMUSCULAR | Status: DC | PRN
  Administered 2019-01-21: 12 mL/h via EPIDURAL

## 2019-01-21 MED ORDER — LACTATED RINGERS IV SOLN
500.0000 mL | Freq: Once | INTRAVENOUS | Status: AC
  Administered 2019-01-21: 500 mL via INTRAVENOUS

## 2019-01-21 MED ORDER — LIDOCAINE HCL (PF) 1 % IJ SOLN
INTRAMUSCULAR | Status: DC | PRN
  Administered 2019-01-21: 10 mL via EPIDURAL

## 2019-01-21 MED ORDER — OXYTOCIN 40 UNITS IN NORMAL SALINE INFUSION - SIMPLE MED
1.0000 m[IU]/min | INTRAVENOUS | Status: DC
  Administered 2019-01-21: 2 m[IU]/min via INTRAVENOUS
  Administered 2019-01-21: 6 m[IU]/min via INTRAVENOUS

## 2019-01-21 MED ORDER — FENTANYL-BUPIVACAINE-NACL 0.5-0.125-0.9 MG/250ML-% EP SOLN
12.0000 mL/h | EPIDURAL | Status: DC | PRN
  Filled 2019-01-21: qty 250

## 2019-01-21 MED ORDER — DIBUCAINE (PERIANAL) 1 % EX OINT: 1.0000 "application " | TOPICAL_OINTMENT | CUTANEOUS | Status: DC | PRN

## 2019-01-21 MED ORDER — LACTATED RINGERS IV SOLN: INTRAVENOUS | Status: DC

## 2019-01-21 MED ORDER — LEVETIRACETAM 500 MG PO TABS
1000.0000 mg | ORAL_TABLET | Freq: Two times a day (BID) | ORAL | Status: DC
  Administered 2019-01-21 – 2019-01-23 (×5): 1000 mg via ORAL
  Filled 2019-01-21 (×10): qty 2

## 2019-01-21 NOTE — Progress Notes (Signed)
Under Assessment/Plan in H/P for patient, indicated to watch closely because of seizure history and "ativan 2 mg IV prn for seizure activity"; however, no order in.  Notified first-call L/D (Tanya) to request order be placed.

## 2019-01-21 NOTE — Discharge Summary (Signed)
OB Discharge Summary     Patient Name: Renee Velez DOB: 1995-03-17 MRN: [id number]  Date of admission: 01/21/2019 Delivering MD: Carollee Leitz   Date of discharge: 01/23/2019  Admitting diagnosis: IUGR (intrauterine growth restriction) affecting care of mother [O36.5990] Intrauterine pregnancy: [redacted]w[redacted]d     Secondary diagnosis:  Active Problems:   Seizure disorder ([place])   Bipolar 1 disorder ([place])   Rh negative, antepartum   IUGR (intrauterine growth restriction) affecting care of mother   SVD (spontaneous vaginal delivery)  Additional problems: none     Discharge diagnosis: Term Pregnancy Delivered                                                                                                Post partum procedures:rhogam given 01/22/2019  Augmentation: AROM, Pitocin, Cytotec and Foley Balloon  Complications: None  Hospital course:  Induction of Labor With Vaginal Delivery   23 y.o. yo [phone] at [redacted]w[redacted]d was admitted to the hospital 01/21/2019 for induction of labor.  Indication for induction: IUGR.  Patient had an uncomplicated labor course as follows: Membrane Rupture Time/Date: 12:31 PM ,01/21/2019   Intrapartum Procedures: Episiotomy: None [1]                                         Lacerations:  None [1]  Patient had delivery of a Viable infant.  Information for the patient's newborn:  Anahli, Arvanitis Girl Daylyn [[id number]]  Delivery Method: Vag-Spont    01/21/2019  Details of delivery can be found in separate delivery note.  Patient had a routine postpartum course. Patient is discharged home 01/23/19.  Physical exam  Vitals:   01/22/19 0604 01/22/19 1421 01/22/19 2230 01/23/19 0500  BP: 109/60 116/70 99/65 118/69  Pulse: (!) 58 (!) 56 60 (!) 53  Resp: 18 18 18 18   Temp: 97.7 F (36.5 C) 98 F (36.7 C) 98 F (36.7 C) 98.2 F (36.8 C)  TempSrc: Oral  Oral Oral  SpO2: 98%   98%  Weight:      Height:       General: alert, cooperative and no distress Lochia:  appropriate Uterine Fundus: firm Incision: N/A DVT Evaluation: No evidence of DVT seen on physical exam. No cords or calf tenderness. No significant calf/ankle edema. Labs: Lab Results  Component Value Date   WBC 12.8 (H) 01/21/2019   HGB 9.3 (L) 01/21/2019   HCT 28.2 (L) 01/21/2019   MCV 88.7 01/21/2019   PLT 263 01/21/2019   CMP Latest Ref Rng & Units 12/03/2018  Glucose 70 - 99 mg/dL 89  BUN 6 - 20 mg/dL 7  Creatinine 0.44 - 1.00 mg/dL 0.61  Sodium 135 - 145 mmol/L 137  Potassium 3.5 - 5.1 mmol/L 3.4(L)  Chloride 98 - 111 mmol/L 106  CO2 22 - 32 mmol/L 22  Calcium 8.9 - 10.3 mg/dL 8.9  Total Protein 6.0 - 8.5 g/dL -  Total Bilirubin 0.0 - 1.2 mg/dL -  Alkaline Phos 39 - 117 IU/L -  AST  0 - 40 IU/L -  ALT 0 - 32 IU/L -    Discharge instruction: per After Visit Summary and "Baby and Me Booklet".  After visit meds:  Allergies as of 01/23/2019      Reactions   Nickel Rash      Medication List    STOP taking these medications   aspirin EC 81 MG tablet   folic acid 1 MG tablet Commonly known as: FOLVITE   NON FORMULARY     TAKE these medications   acetaminophen 325 MG tablet Commonly known as: Tylenol Take 2 tablets (650 mg total) by mouth every 6 (six) hours as needed.   ibuprofen 600 MG tablet Commonly known as: ADVIL Take 1 tablet (600 mg total) by mouth every 6 (six) hours as needed.   levETIRAcetam 1000 MG tablet Commonly known as: KEPPRA Take 1 tablet (1,000 mg total) by mouth 2 (two) times daily.   nitrofurantoin (macrocrystal-monohydrate) 100 MG capsule Commonly known as: MACROBID Take 1 capsule (100 mg total) by mouth at bedtime.   prenatal multivitamin Tabs tablet Take 1 tablet by mouth daily at 12 noon.   senna-docusate 8.6-50 MG tablet Commonly known as: Senokot-S Take 2 tablets by mouth at bedtime as needed for mild constipation.       Diet: routine diet  Activity: Advance as tolerated. Pelvic rest for 6 weeks.   Outpatient  follow up:4 weeks Follow up Appt:No future appointments. Follow up Visit:No follow-ups on file.   Please schedule this patient for Postpartum visit in: 4 weeks with the following provider: Any provider For C/S patients schedule nurse incision check in weeks 2 weeks: no High risk pregnancy complicated by: IUGR, epilepsy on Keppra  Delivery mode:  SVD Anticipated Birth Control:  LARC at postpartum visit PP Procedures needed: None   Schedule Integrated BH visit: no  Postpartum contraception: plans LARC at postpartum visit.  Newborn Data: Live born female  Birth Weight: 4 lb 10.1 oz (2100 g) APGAR: 8, 9   Newborn Delivery   Birth date/time: 01/21/2019 12:31:00 Delivery type: Vaginal, Spontaneous      Baby Feeding: Breast Disposition:undecided, peds monitoring feeding d/t IUGR status, may request baby stay in hospital one additional night, TBD this afternoon   01/23/2019 Marylen Ponto, NP

## 2019-01-21 NOTE — Anesthesia Preprocedure Evaluation (Addendum)
Anesthesia Evaluation  Patient identified by MRN, date of birth, ID band Patient awake    Reviewed: Allergy & Precautions, NPO status , Patient's Chart, lab work & pertinent test results  Airway Mallampati: II  TM Distance: >3 FB Neck ROM: Full    Dental   Pulmonary Current Smoker,    Pulmonary exam normal        Cardiovascular hypertension (gestational HTN), Normal cardiovascular exam     Neuro/Psych Seizures -,  PSYCHIATRIC DISORDERS Anxiety Depression Bipolar Disorder    GI/Hepatic negative GI ROS, Neg liver ROS,   Endo/Other  negative endocrine ROS  Renal/GU negative Renal ROS  negative genitourinary   Musculoskeletal negative musculoskeletal ROS (+)   Abdominal   Peds  Hematology  (+) Blood dyscrasia (Hgb 9.3), anemia ,   Anesthesia Other Findings   Reproductive/Obstetrics (+) Pregnancy                            Anesthesia Physical Anesthesia Plan  ASA: III  Anesthesia Plan: Epidural   Post-op Pain Management:    Induction:   PONV Risk Score and Plan: Treatment may vary due to age or medical condition  Airway Management Planned: Natural Airway  Additional Equipment:   Intra-op Plan:   Post-operative Plan:   Informed Consent: I have reviewed the patients History and Physical, chart, labs and discussed the procedure including the risks, benefits and alternatives for the proposed anesthesia with the patient or authorized representative who has indicated his/her understanding and acceptance.       Plan Discussed with: Anesthesiologist  Anesthesia Plan Comments:        Anesthesia Quick Evaluation

## 2019-01-21 NOTE — H&P (Addendum)
OBSTETRIC ADMISSION HISTORY AND PHYSICAL  Renee Velez is a 23 y.o. female [phone] with IUP at [redacted]w[redacted]d by 7 wk [place] presenting for IOL due to gHTN and IUGR. She reports +FMs, No LOF, no VB, no blurry vision, headaches or peripheral edema, and RUQ pain.  She plans on breast feeding. She request LARC for birth control, but is undecided on method. She received her prenatal care at [place]: By 7wk [place] --->  Estimated Date of Delivery: 02/10/19  Sono:    @[redacted]w[redacted]d , CWD, normal anatomy, cephalic presentation, posterior placenta lie, 1866g, 3% EFW   Prenatal History/Complications: IUGR gHTN in previous pregnancy, on ASA Seizure disorder- not compliant w/ Keppra Bipolar Disorder, Anxiety Tobacco use- approx 6 ciggs/day Pyelo in 3rd trimester  Past Medical History: Past Medical History:  Diagnosis Date  . Anxiety   . Chlamydia   . Epilepsy ([place])   . Gonorrhea   . Post partum depression 2018  . Seizure [facility])     Past Surgical History: Past Surgical History:  Procedure Laterality Date  . NO PAST SURGERIES      Obstetrical History: OB History    Gravida  5   Para  3   Term  3   Preterm      AB  1   Living  3     SAB  1   TAB  0   Ectopic  0   Multiple  0   Live Births  3           Social History: Social History   Socioeconomic History  . Marital status: Single    Spouse name: Not on file  . Number of children: Not on file  . Years of education: Not on file  . Highest education level: Not on file  Occupational History    Comment: unemployed  Tobacco Use  . Smoking status: Current Every Day Smoker    Packs/day: 0.25    Types: Cigarettes  . Smokeless tobacco: Never Used  . Tobacco comment: smokes 1 cigarette a day  Substance and Sexual Activity  . Alcohol use: Not Currently  . Drug use: No  . Sexual activity: Yes  Other Topics Concern  . Not on file  Social History Narrative  . Not on file   Social Determinants of Health   Financial Resource  Strain:   . Difficulty of Paying Living Expenses: Not on file  Food Insecurity: No Food Insecurity  . Worried About Charity fundraiser in the Last Year: Never true  . Ran Out of Food in the Last Year: Never true  Transportation Needs: No Transportation Needs  . Lack of Transportation (Medical): No  . Lack of Transportation (Non-Medical): No  Physical Activity:   . Days of Exercise per Week: Not on file  . Minutes of Exercise per Session: Not on file  Stress: No Stress Concern Present  . Feeling of Stress : Not at all  Social Connections:   . Frequency of Communication with Friends and Family: Not on file  . Frequency of Social Gatherings with Friends and Family: Not on file  . Attends Religious Services: Not on file  . Active Member of Clubs or Organizations: Not on file  . Attends Archivist Meetings: Not on file  . Marital Status: Not on file    Family History: Family History  Problem Relation Age of Onset  . Hypertension Father   . Cancer Maternal Grandmother   . Cancer Paternal  Grandmother   . Diabetes Paternal Grandfather   . Thyroid disease Sister   . ADD / ADHD Brother     Allergies: Allergies  Allergen Reactions  . Nickel Rash    Medications Prior to Admission  Medication Sig Dispense Refill Last Dose  . aspirin EC 81 MG tablet Take 1 tablet (81 mg total) by mouth daily. Take after 12 weeks for prevention of preeclampsia later in pregnancy 300 tablet 2   . folic acid (FOLVITE) 1 MG tablet Take 1 tablet (1 mg total) by mouth daily. (Patient not taking: Reported on 01/09/2019) 30 tablet 5   . levETIRAcetam (KEPPRA) 1000 MG tablet Take 1 tablet (1,000 mg total) by mouth 2 (two) times daily. 60 tablet 5   . nitrofurantoin, macrocrystal-monohydrate, (MACROBID) 100 MG capsule Take 1 capsule (100 mg total) by mouth at bedtime. (Patient not taking: Reported on 12/23/2018) 30 capsule 3   . NON FORMULARY CBD Oil     . Prenatal Vit-Fe Fumarate-FA (PRENATAL  MULTIVITAMIN) TABS tablet Take 1 tablet by mouth daily at 12 noon.        Review of Systems   All systems reviewed and negative except as stated in HPI  Blood pressure 115/61, pulse 76, temperature 97.7 F (36.5 C), temperature source Oral, resp. rate 16, height 5\' 4"  (1.626 m), weight 69.4 kg, last menstrual period 04/26/2018, unknown if currently breastfeeding. General appearance: alert, cooperative, appears stated age and no distress Lungs: clear to auscultation bilaterally Heart: regular rate and rhythm Abdomen: soft, non-tender; bowel sounds normal Pelvic: 2/30/ballotable/posterior/soft Extremities: Homans sign is negative, no sign of DVT Presentation: cephalic Fetal monitoringBaseline: 125 bpm, Variability: Good {> 6 bpm), Accelerations: Reactive and Decelerations: Absent Uterine activityNone     Prenatal labs: ABO, Rh: --/--/A NEG (11/01 0513) Antibody: POS (11/01 0513) Rubella: 1.30 (06/24 1143) RPR: Non Reactive (10/20 1206)  HBsAg: Negative (06/24 1143)  HIV: Non Reactive (10/20 1206)  GBS: Negative/-- (12/07 1705)  1 hr Glucola 79, 76 Genetic screening  deferred Anatomy US IUGR  Prenatal Transfer Tool  Maternal Diabetes: No Genetic Screening: Declined Maternal Ultrasounds/Referrals: IUGR Fetal Ultrasounds or other Referrals:  Referred to Materal Fetal Medicine  Maternal Substance Abuse:  No Significant Maternal Medications:  Meds include: Other: Keppra, ASA Significant Maternal Lab Results: Group B Strep negative and Rh negative  Results for orders placed or performed during the hospital encounter of 01/21/19 (from the past 24 hour(s))  CBC   Collection Time: 01/21/19 12:18 AM  Result Value Ref Range   WBC 12.8 (H) 4.0 - 10.5 K/uL   RBC 3.18 (L) 3.87 - 5.11 MIL/uL   Hemoglobin 9.3 (L) 12.0 - 15.0 g/dL   HCT 16.128.2 (L) 09.636.0 - 04.546.0 %   MCV 88.7 80.0 - 100.0 fL   MCH 29.2 26.0 - 34.0 pg   MCHC 33.0 30.0 - 36.0 g/dL   RDW 40.915.3 81.111.5 - 91.415.5 %   Platelets 263  150 - 400 K/uL   nRBC 0.0 0.0 - 0.2 %    Patient Active Problem List   Diagnosis Date Noted  . IUGR (intrauterine growth restriction) affecting care of mother 01/21/2019  . Anxiety 12/12/2018  . Pyelonephritis affecting pregnancy in third trimester 12/03/2018  . History of pyelonephritis during pregnancy 11/17/2018  . Pregnancy affected by fetal growth restriction 09/21/2018  . Supervision of high risk pregnancy, antepartum 07/13/2018  . Rh negative, antepartum 07/13/2018  . History of gestational hypertension 04/13/2016  . Nausea and vomiting during pregnancy 04/13/2016  .  Seizure disorder ([facility]) 09/10/2015  . Bipolar 1 disorder ([facility]) 09/10/2015  . Tobacco use disorder 09/10/2015    Assessment/Plan:  Evva Din is a 23 y.o. [id number] at [redacted]w[redacted]d here for IOL for IUGR, gHTN.  #Labor: Placed FB w/o complication. Will dose BU cytotec . Consider pit when FB falls out. Anticipate vaginal delivery. #Pain: Would like to avoid epidural, prn fentanyl #FWB: Cat 1 FHT, continue to monitor #ID:  GBS neg, Asx COVID neg.  #MOF: breast #MOC: would like inpatient LARC, undecided on method.  #Epilepsy: She takes her Keppra sparingly as she does not like the way it makes her feel. Last seizure was 12/11. Took it for the last few days because she knew we would ask about it, but likely not at therapeutic levels, will hold for now. Monitor closely, ativan 2mg  IV prn seizure-like activity. #pyelonephritis in 3rd trimester: on macrobid for suppression, continue.    , MD  01/21/2019, 12:45 AM  I confirm that I have verified the information documented in the resident's note and that I have also personally reperformed the history, physical exam and all medical decision making activities of this service and have verified that all service and findings are accurately documented in this student's note.    01/23/2019, CNM 01/21/2019 5:32 AM

## 2019-01-21 NOTE — Anesthesia Procedure Notes (Signed)
Epidural Patient location during procedure: OB Start time: 01/21/2019 8:04 AM End time: 01/21/2019 8:13 AM  Staffing Anesthesiologist: Lidia Collum, MD Performed: anesthesiologist   Preanesthetic Checklist Completed: patient identified, IV checked, risks and benefits discussed, monitors and equipment checked, pre-op evaluation and timeout performed  Epidural Patient position: sitting Prep: DuraPrep Patient monitoring: heart rate, continuous pulse ox and blood pressure Approach: midline Location: L3-L4 Injection technique: LOR air  Needle:  Needle type: Tuohy  Needle gauge: 17 G Needle length: 9 cm Needle insertion depth: 5 cm Catheter type: closed end flexible Catheter size: 19 Gauge Catheter at skin depth: 10 cm Test dose: negative  Assessment Events: blood not aspirated, injection not painful, no injection resistance, no paresthesia and negative IV test  Additional Notes Reason for block:procedure for pain

## 2019-01-21 NOTE — Progress Notes (Signed)
Renee Velez is a 23 y.o. U2V2536 at [redacted]w[redacted]d admitted for induction of labor due to [place].  Subjective: Comfortable with epidural in place.  No seizure activity.  Patient agrees to take [place].  Objective: BP 114/65   Pulse 66   Temp 97.7 F (36.5 C) (Oral)   Resp 18   Ht 5\' 4"  (1.626 m)   Wt 69.4 kg   LMP 04/26/2018 (Approximate)   SpO2 100%   BMI 26.26 kg/m  Total I/O In: 886.2 [I.V.:886.2] Out: 600 [Urine:600]  FHT:  FHR: 135 bpm, variability: moderate,  accelerations:  Present,  decelerations:  Absent UC:   Moderate on Toco  SVE:   Dilation: 5 Effacement (%): 70 Station: -1 Exam by:: Sharyn Lull, RN   Pitocin @ 6 mu/min  Labs: Lab Results  Component Value Date   WBC 12.8 (H) 01/21/2019   HGB 9.3 (L) 01/21/2019   HCT 28.2 (L) 01/21/2019   MCV 88.7 01/21/2019   PLT 263 01/21/2019    Assessment / Plan: Renee Velez is a 23 y.o G5P3013 at [redacted]w[redacted]d here for IOL 2/2 FGR  Labor: s/p Cytotec x1 , FB, Pitocin.  Continue to tirtate Pitocin.  AROM as appropriate Fetal Wellbeing:  Category I Pain Control:  Epidural I/D:  GBS negative Anticipated MOD:  Vaginal Delivery, CS as appropriate  Carollee Leitz MD PGY 1 Family Med Practice 01/21/2019, 11:33 AM

## 2019-01-21 NOTE — Lactation Note (Signed)
This note was copied from a baby's chart. Lactation Consultation Note  Patient Name: Renee Velez TFTDD'U Date: 01/21/2019 Reason for consult: Initial assessment;Infant < 6lbs;Early term 75-38.6wks  P4 mother whose infant is now 48 hours old.  This is an ETI at 37+1 weeks weighing < 5 lbs.  Baby had an initial blood glucose of 41 mg/dl.  Mother breast fed her first child (now 24 years old) for 4-5 months, her second child (now 18 years old) for 4-5 months and her third child (just turned [age] year old) for 5 months.  Visited with mother early due to small size, gestational age and low blood glucose level.  Parents aware.  Mother has breast fed one time since delivery and she stated baby fed fairly well.  Offered to assist with latching and mother accepted.  Mother's breasts are soft and non tender and nipples are everted.  Assisted baby to latch to the left breast after a few attempts.  Baby remained very sleepy at this time and put forth little effort.  Asked mother to demonstrate hand expression and she was able to express 2 1/2 mls of EBM which I fed back to baby.  Asked father to hold her STS while I discussed pump set up with mother.  She started showing cues when father was holding her and attempted to latch a second time.  This time baby latched and fed for 6 minutes with constant stimulation.  Mother's colostrum flowed easily.  When baby became to sleepy to continue father held her STS again.  Initiated the DEBP: pump parts, assembly, disassembly and cleaning discussed.  #24 flange size is appropriate at this time.  Observed mother pumping for 10 minutes while reviewing the ETI and pumping.  Mother will feed baby at least every three hours.  She will hand express before/after feedings to help increase milk supply and feed back any EBM she obtains.  Mother will pump for 15 minutes after every feeding.  I did explain that, at some time, baby most likely will need supplementation and I would prefer  if she had mother's EBM rather than any other supplementation.  Mother appreciative and willing to pump for her daughter.    Mother does not have a DEBP for home use.  She does not participate in the [facility] program (she has recently moved from [place]) nor does she have private insurance.  [facility] referral faxed and mother will follow up on Monday with a phone call to the [facility] office to determine eligibility to obtain a DEBP.  Father present and supportive.  He will be returning home tonight to care for the other children.  RN updated.  Mom made aware of O/P services, breastfeeding support groups, community resources, and our phone # for post-discharge questions. Mother will call for questions/concerns.   Maternal Data Formula Feeding for Exclusion: No Has patient been taught Hand Expression?: Yes Does the patient have breastfeeding experience prior to this delivery?: Yes  Feeding Feeding Type: Breast Fed  LATCH Score Latch: Repeated attempts needed to sustain latch, nipple held in mouth throughout feeding, stimulation needed to elicit sucking reflex.  Audible Swallowing: A few with stimulation  Type of Nipple: Everted at rest and after stimulation  Comfort (Breast/Nipple): Soft / non-tender  Hold (Positioning): Assistance needed to correctly position infant at breast and maintain latch.  LATCH Score: 7  Interventions Interventions: Breast feeding basics reviewed;Assisted with latch;Skin to skin;Breast massage;Hand express;Breast compression;Adjust position;DEBP;Expressed milk;Position options;Support pillows  Lactation Tools Discussed/Used [facility]  Program: No(Information provided regarding WIC sign up) Pump Review: Setup, frequency, and cleaning;Milk Storage Initiated by:: Paul Dykes Date initiated:: 01/21/19   Consult Status Consult Status: Follow-up Date: 01/22/19 Follow-up type: In-patient    Riyanshi Wahab R Wyndell Cardiff 01/21/2019, 4:59 PM

## 2019-01-21 NOTE — Progress Notes (Signed)
Labor Progress Note Renee Velez is a 23 y.o. [id number] at [redacted]w[redacted]d presented for IOL due to IUGR. S: FB fell out about 1 hr ago. Feeling CTX intermittently but not requiring prn meds.   O:  BP (!) 108/54   Pulse 71   Temp 97.7 F (36.5 C) (Oral)   Resp 16   Ht 5\' 4"  (1.626 m)   Wt 69.4 kg   LMP 04/26/2018 (Approximate)   BMI 26.26 kg/m  EFM: 145bpm/mod var/+accels, no decels  CVE: Dilation: 3 Effacement (%): 50 Cervical Position: Posterior Station: -3 Presentation: Vertex Exam by:: Dr. Lia Foyer   A&P: 23 y.o. [id number] [redacted]w[redacted]d IOL due to IUGR. #Labor: Progressing well. FB fell out around 0430, s/p 1 dose cytotec. Will start pitocin. Anticipate vaginal delivery. #Pain: IV fentanyl prn #FWB: Cat 1 FHT #GBS negative #Epilepsy: She takes her Keppra sparingly as she does not like the way it makes her feel. Last seizure was 12/11. Took it for the last few days because she knew we would ask about it, but likely not at therapeutic levels, will hold for now. Monitor closely, ativan 2mg  IV prn seizure-like activity. #pyelonephritis in 3rd trimester: on macrobid for suppression, continue.  Demetrius Revel, MD 5:43 AM

## 2019-01-22 MED ORDER — RHO D IMMUNE GLOBULIN 1500 UNIT/2ML IJ SOSY
300.0000 ug | PREFILLED_SYRINGE | Freq: Once | INTRAMUSCULAR | Status: AC
  Administered 2019-01-22: 300 ug via INTRAVENOUS
  Filled 2019-01-22: qty 2

## 2019-01-22 MED ORDER — FERROUS SULFATE 325 (65 FE) MG PO TABS
325.0000 mg | ORAL_TABLET | Freq: Every day | ORAL | Status: DC
  Administered 2019-01-23: 325 mg via ORAL
  Filled 2019-01-22: qty 1

## 2019-01-22 NOTE — Progress Notes (Signed)
Patient ID: Renee Velez, female   DOB: March 07, 1995, 23 y.o.   MRN: [id number]    POSTPARTUM PROGRESS NOTE  Post Partum Day 1  Subjective:  Renee Velez is a 23 y.o. [id number] s/p SVD at [redacted]w[redacted]d.  No acute events overnight.  Pt denies problems with ambulating, voiding or po intake.  She denies nausea or vomiting.  Pain is well controlled.  She has had flatus. Lochia Minimal.   Objective: Blood pressure 109/60, pulse (!) 58, temperature 97.7 F (36.5 C), temperature source Oral, resp. rate 18, height 5\' 4"  (1.626 m), weight 69.4 kg, last menstrual period 04/26/2018, SpO2 98 %, unknown if currently breastfeeding.  Physical Exam:  General: alert, cooperative and no distress Chest: no respiratory distress Heart:regular rate, distal pulses intact Abdomen: soft, nontender,  Uterine Fundus: firm, appropriately tender DVT Evaluation: No calf swelling or tenderness Extremities: no edema Skin: warm, dry  Recent Labs    01/21/19 0018  HGB 9.3*  HCT 28.2*    Assessment/Plan: Renee Velez is a 23 y.o. [id number] s/p SVD at [redacted]w[redacted]d   PPD# 1 - Doing well Contraception: IUD at 6 week visit  Feeding: breast and bottle  Dispo: Plan for discharge later today or tomorrow. Mom became tearful when discussing DC home today. Baby is IUGR and only 4 lbs. Peds has not cleared baby for DC. If peds DC's baby today ok for mom to go later today. Mom is agreeable to plan.    LOS: 1 day    Lezlie Lye, NP 01/22/2019 11:35 AM

## 2019-01-22 NOTE — Clinical Social Work Maternal (Signed)
CLINICAL SOCIAL WORK MATERNAL/CHILD NOTE  Patient Details  Name: Renee Velez MRN: [id number] Date of Birth: 10-13-1995  Date:  01/22/2019  Clinical Social Worker Initiating Note:  Laurey Arrow Date/Time: Initiated:  01/22/19/1100     Child's Name:  Renee Velez   Biological Parents:  Mother, Father   Need for Interpreter:  None   Reason for Referral:      Address:  [place]  [id number]    Phone number:  [phone] (home)     Additional phone number: MOB reports that she and FOB do not currently have a working phone number but can be contacted via email at [email or web address]  Household Members/Support Persons (HM/SP):   Household Member/Support Person 1, Household Member/Support Person 2, Household Member/Support Person 3, Household Member/Support Person 4   HM/SP Name Relationship DOB or Age  HM/SP -1 Tobyus Royster Sr. FOB 01/09/1988  HM/SP -2 Avianna Bellamy daughter 10/29/14  HM/SP -3 Tobyus Royster Jr. son 10/15/2016  HM/SP -4 Tyrann Royster son 01/13/2018  HM/SP -5        HM/SP -6        HM/SP -7        HM/SP -8          Natural Supports (not living in the home):  (Per MOB, FOB and FOB are her major source of support.)   Professional Supports:     Employment: Unemployed   Type of Work:     Education:  Programmer, systems   Homebound arranged:    Museum/gallery curator Resources:  [employer]   Other Resources:  Physicist, medical  , Cecilia Considerations Which May Impact Care:  None Reported  Strengths:  Home prepared for child  , Ability to meet basic needs  (MOB reported having a car bassinet and reported that FOB plans to purchase a car seat prior to infant's discharge.)   Psychotropic Medications:         Pediatrician:       Pediatrician List:   [place]      Pediatrician Fax Number:    Risk Factors/Current  Problems:  Mental Health Concerns  , Substance Use     Cognitive State:  Able to Concentrate  , Alert  , Linear Thinking  , Insightful     Mood/Affect:  Tearful  , Relaxed  , Anxious  , Interested  , Comfortable     CSW Assessment: CSW met with MOB in room 508 to complete an assessment for MH hx, SA hx, and housing concerns.  When CSW arrived MOB was resting in bed in engaging in skin to skin with infant. MOB and infant appeared comfortable and happy. CSW asked about MOB's housing situation and MOB reported that she, FOB and the 3 older children are currently residing in a hotel ([employer] on [address]).  Per MOB, the family has been residing in a hotel "For a few months" and are being making weekly payments.  MOB communicated she is currently not working and FOB is the financial provider. MOB denied need any assistance to pay weekly hotel fees and reported having all essential items needed for infant and MOB's older 3 children.  MOB shared that she does not have a car seat at this time however, FOB plans to purchase one prior to infant's discharge.  MOB is aware  that if the family is in need of assistance with purchasing a car seat MOB can contact CSW.   CSW asked about MOB's MH hx and MOB openly acknowledged a dx of bipolar disorder and anxiety.  MOB shared that she was dx in 2018 and is not currently taking any medications. However, MOB acknowledged. that her OB provider recommended a psychological assessment (per MOB her OB provider will schedule/or make referral to a provider). CSW provided education regarding the baby blues period vs. perinatal mood disorders, discussed treatment and gave resources for mental health follow up if concerns arise.  CSW recommends self-evaluation during the postpartum time period using the New Mom Checklist from Postpartum Progress and encouraged MOB to contact a medical professional if symptoms are noted at any time.  MOB acknowledged PMAD symptoms with MOB's  2nd and 3rd child.  Per MOB, her Provider was notified and MOB's symptoms were managed with medications (names unknown by MOB).  MOB shared, "After a few months my symptoms went away and if they felt like they were coming back I would smoke weed or do CBD oils." CSW assessed for safety and MOB denied SI, HI, and DV.   CSW reviewed hospital's substance exposure policy and MOB was understanding.  MOB became tearful when CSW notified MOB that CSW will make a report to [facility] CPS if infant's UDS or CDS are positive without and explanation (MOB denied CPS hx). MOB reported she uses CBD's daily  to assist with managing her seizures and her MH.  Per MOB, MOB has tried prescribed medications and stated, "I don't like how they make me feel."    CSW provided review of Sudden Infant Death Syndrome (SIDS) precautions; MOB appeared knowledgeable    CSW provided family with a bundle pack.   CSW identifies no further need for intervention and no barriers to discharge at this time.   CSW Plan/Description:  No Further Intervention Required/No Barriers to Discharge, Sudden Infant Death Syndrome (SIDS) Education, Perinatal Mood and Anxiety Disorder (PMADs) Education, Other Patient/Family Education, [place], Other Information/Referral to [employer], CSW Will Continue to Monitor Umbilical Cord Tissue Drug Screen Results and Make Report if Warranted   Laurey Arrow, MSW, LCSW Clinical Social Work [phone]   Dimple Nanas, LCSW 01/22/2019, 1:18 PM

## 2019-01-22 NOTE — Lactation Note (Signed)
This note was copied from a baby's chart. Lactation Consultation Note Baby 4 hrs old. LC had tried to see mom several times during the night, mom sleeping. Baby hadn't been supplemented yet w/formula. Order for baby to be supplemented w/24 cal. Similac after BF. Mom stated baby didn't like it. Watched mom latch baby. Assisted in comfort, pillows and props. Baby latched well. Noted baby started coughing. Mom was massaging breast occasionally. Reclined mom backwards some, positioned baby closer leaning more on top instead under the breast. Discussed when mom hears baby cough, her let down is to fast for the baby, needs to elevate. Noted helpful. Gave mom LPI information sheet. Discussed timely feedings, energy conserving, wearing hat and keeping warm, STS, strict I&O, importance of supplementing after BF as well as pumping. Mom had pumped collecting 5 ml. Encouraged mom to give after BF then formula that was ordered. Instructed not to mix formula and colostrum. Baby alert BF well. Washed mom's pump parts. Praised mom for pumping. Encouraged to call for questions or concerns.  Patient Name: Renee Velez [id number] Date: 01/22/2019 Reason for consult: Follow-up assessment;Infant < 6lbs;Early term 37-38.6wks   Maternal Data    Feeding Feeding Type: Breast Fed  LATCH Score Latch: Grasps breast easily, tongue down, lips flanged, rhythmical sucking.  Audible Swallowing: Spontaneous and intermittent  Type of Nipple: Everted at rest and after stimulation  Comfort (Breast/Nipple): Soft / non-tender  Hold (Positioning): Assistance needed to correctly position infant at breast and maintain latch.  LATCH Score: 9  Interventions Interventions: Breast feeding basics reviewed;Support pillows;Position options;Skin to skin;Expressed milk;Breast massage;Adjust position;Breast compression;DEBP  Lactation Tools Discussed/Used Tools: Pump Breast pump type: Double-Electric Breast  Pump   Consult Status Consult Status: Follow-up Date: 01/23/19 Follow-up type: In-patient    Theodoro Kalata 01/22/2019, 5:44 AM

## 2019-01-22 NOTE — Anesthesia Postprocedure Evaluation (Signed)
Anesthesia Post Note  Patient: Renee Velez  Procedure(s) Performed: AN AD HOC LABOR EPIDURAL     Patient location during evaluation: Mother Baby Anesthesia Type: Epidural Level of consciousness: awake and alert Pain management: pain level controlled Vital Signs Assessment: post-procedure vital signs reviewed and stable Respiratory status: spontaneous breathing, nonlabored ventilation and respiratory function stable Cardiovascular status: stable Postop Assessment: no headache, no backache and epidural receding Anesthetic complications: no    Last Vitals:  Vitals:   01/21/19 2056 01/22/19 0604  BP: 110/67 109/60  Pulse: 61 (!) 58  Resp: 18 18  Temp: 36.7 C 36.5 C  SpO2: 98% 98%    Last Pain:  Vitals:   01/22/19 0604  TempSrc: Oral  PainSc:    Pain Goal: Patients Stated Pain Goal: 5 (01/21/19 0535)                 Rayvon Char

## 2019-01-22 NOTE — Lactation Note (Signed)
This note was copied from a baby's chart. Lactation Consultation Note  Patient Name: Renee Velez Date: 01/22/2019 Reason for consult: Follow-up assessment;Infant < 6lbs;Early term 37-38.6wks  1815 - 1835 - I followed up with Renee Velez. She reports that baby Renee Velez has been breast feeding well. She states she feels changes occurring in her breasts; she has a history of good milk supply and has donated her breast milk in the past.  When asked about supplementation, she states that Renee Velez has regurgitated the formula and stated a preference for breast feeding. I educated on donor breast milk, and she was interested in this. I also educated on supplementation with her breast milk, and she states that she has not really been pumping today, but she feels that she could obtain milk this way.   I reviewed how to use the manual pump, and encouraged her to either use her DEBP or her manual and to supplement baby post breast feeding. Recommended 15 mls, if possible. She has a bottle nipple in the room.   I also provided the breast feeding brochure and wrote down relevant phone numbers for [facility], and discussed [place] breast pump loaner program. Renee Velez seemed very interested in this program, and I encouraged her to call them on Monday.   I reviewed pumping and donor milk supplementation option in lieu of formula with evening NP, Renee Velez, and she approved use of maternal breast milk or donor milk for supplementation.  I recommended that Renee Velez see how much milk she can obtain via pumping and we could revisit use of donor milk this evening, if needed.   Maternal Data Does the patient have breastfeeding experience prior to this delivery?: Yes   Interventions Interventions: Breast feeding basics reviewed;Hand pump  Lactation Tools Discussed/Used Tools: Pump;Other (comment)(manual pump) Breast pump type: Manual WIC Program: (new enrollment) Pump Review: Setup, frequency, and  cleaning   Consult Status Consult Status: Follow-up Date: 01/23/19 Follow-up type: In-patient    Lenore Manner 01/22/2019, 6:52 PM

## 2019-01-23 LAB — RH IG WORKUP (INCLUDES ABO/RH)
ABO/RH(D): A NEG
Fetal Screen: NEGATIVE
Gestational Age(Wks): 37.1
Unit division: 0

## 2019-01-23 MED ORDER — ACETAMINOPHEN 325 MG PO TABS: 650.0000 mg | ORAL_TABLET | Freq: Four times a day (QID) | ORAL | 0 refills | Status: DC | PRN

## 2019-01-23 MED ORDER — IBUPROFEN 600 MG PO TABS: 600.0000 mg | ORAL_TABLET | Freq: Four times a day (QID) | ORAL | 0 refills | Status: DC | PRN

## 2019-01-23 MED ORDER — SENNOSIDES-DOCUSATE SODIUM 8.6-50 MG PO TABS: 2.0000 | ORAL_TABLET | Freq: Every evening | ORAL | 0 refills | Status: DC | PRN

## 2019-01-23 NOTE — Lactation Note (Signed)
This note was copied from a baby's chart. Lactation Consultation Note  Patient Name: Renee Velez [id number] Date: 01/23/2019 Reason for consult: Follow-up assessment;Early term 37-38.6wks;Infant < 6lbs Baby is 46 hours/5% weight loss.  Mom reports that baby is latching and feeding well.  Mom breastfed 3 previous babies and has a history of an abundant milk supple,  Observed mom position baby in the cross cradle hold.  Baby latched easily and fed very well with many swallows heard.  Instructed on breast massage to increase milk flow when baby becomes sleepy.  After assessment I feel baby can exclusively breast feed and obtain good milk transfer.  Discussed milk coming to volume and the prevention and treatment of engorgement.  Hand pump given with instructions.  Encouraged to call prn.  Maternal Data    Feeding Feeding Type: Breast Fed  LATCH Score Latch: Grasps breast easily, tongue down, lips flanged, rhythmical sucking.  Audible Swallowing: Spontaneous and intermittent  Type of Nipple: Everted at rest and after stimulation  Comfort (Breast/Nipple): Soft / non-tender  Hold (Positioning): No assistance needed to correctly position infant at breast.  LATCH Score: 10  Interventions Interventions: Hand pump  Lactation Tools Discussed/Used     Consult Status Consult Status: Complete Follow-up type: Call as needed    Ave Filter 01/23/2019, 10:48 AM

## 2019-01-23 NOTE — Discharge Instructions (Signed)

## 2019-01-24 LAB — SURGICAL PATHOLOGY

## 2019-01-25 LAB — TYPE AND SCREEN
ABO/RH(D): A NEG
Antibody Screen: POSITIVE
Unit division: 0
Unit division: 0

## 2019-01-25 LAB — BPAM RBC
Blood Product Expiration Date: 202101192359
Blood Product Expiration Date: 202101202359
Unit Type and Rh: 600
Unit Type and Rh: 600

## 2019-02-23 ENCOUNTER — Ambulatory Visit: Payer: Medicaid Other | Admitting: Obstetrics and Gynecology

## 2019-04-04 ENCOUNTER — Telehealth: Payer: Self-pay

## 2019-04-04 ENCOUNTER — Ambulatory Visit: Payer: Medicaid Other

## 2019-04-04 NOTE — Telephone Encounter (Signed)
Called the patient to reschedule the appointment. Unable to contact as no voicemail was available. Mailing the patient a missed appointment letter.

## 2019-06-17 ENCOUNTER — Other Ambulatory Visit: Payer: Self-pay

## 2019-06-17 ENCOUNTER — Emergency Department (HOSPITAL_COMMUNITY)
Admission: EM | Admit: 2019-06-17 | Discharge: 2019-06-17 | Disposition: A | Discharge status: Home / Self Care | Payer: Medicaid Other | Attending: Emergency Medicine | Admitting: Emergency Medicine

## 2019-06-17 ENCOUNTER — Encounter (HOSPITAL_COMMUNITY): Payer: Self-pay

## 2019-06-17 DIAGNOSIS — Z79899 Other long term (current) drug therapy: Secondary | ICD-10-CM | POA: Diagnosis not present

## 2019-06-17 DIAGNOSIS — J069 Acute upper respiratory infection, unspecified: Secondary | ICD-10-CM | POA: Diagnosis not present

## 2019-06-17 DIAGNOSIS — F17210 Nicotine dependence, cigarettes, uncomplicated: Secondary | ICD-10-CM | POA: Diagnosis not present

## 2019-06-17 DIAGNOSIS — Z20822 Contact with and (suspected) exposure to covid-19: Secondary | ICD-10-CM

## 2019-06-17 DIAGNOSIS — R439 Unspecified disturbances of smell and taste: Secondary | ICD-10-CM | POA: Diagnosis present

## 2019-06-17 LAB — SARS CORONAVIRUS 2 (TAT 6-24 HRS): SARS Coronavirus 2: NEGATIVE

## 2019-06-17 LAB — SARS CORONAVIRUS 2 BY RT PCR (HOSPITAL ORDER, PERFORMED IN ~~LOC~~ HOSPITAL LAB): SARS Coronavirus 2: NEGATIVE

## 2019-06-17 NOTE — ED Provider Notes (Signed)
[facility] DEPT Provider Note   CSN: [id number] Arrival date & time: 06/17/19  0449     History Chief Complaint  Patient presents with  . Loss of Taste and Smell    Renee Velez is a 24 y.o. female presents today requesting Covid test.  Patient reports that 5 days ago she began developing symptoms which include a loss of taste and smell, nonproductive cough, fatigue and intermittent cold/hot flashes along with left ear pain.  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  She denies her cough as mild intermittent nonproductive without associated pain or shortness of breath.  She reports left ear pain as a mild ache intermittent worse with cough improved with rest.  Denies measuring a fever at home, denies headache, sore throat, difficulty swallowing, chest pain/shortness of breath, abdominal pain, nausea/vomiting, diarrhea, extremity swelling/color change or any additional concerns.  Of note patient has a 58-month-old, nursing.  HPI     Past Medical History:  Diagnosis Date  . Anxiety   . Chlamydia   . Epilepsy ([place])   . Gonorrhea   . Post partum depression 2018  . Seizure [facility])     Patient Active Problem List   Diagnosis Date Noted  . IUGR (intrauterine growth restriction) affecting care of mother 01/21/2019  . SVD (spontaneous vaginal delivery) 01/21/2019  . Anxiety 12/12/2018  . Pyelonephritis affecting pregnancy in third trimester 12/03/2018  . History of pyelonephritis during pregnancy 11/17/2018  . Pregnancy affected by fetal growth restriction 09/21/2018  . Supervision of high risk pregnancy, antepartum 07/13/2018  . Rh negative, antepartum 07/13/2018  . History of gestational hypertension 04/13/2016  . Nausea and vomiting during pregnancy 04/13/2016  . Seizure disorder ([place]) 09/10/2015  . Bipolar 1 disorder ([place]) 09/10/2015  . Tobacco use disorder 09/10/2015    Past Surgical History:  Procedure Laterality  Date  . NO PAST SURGERIES       OB History    Gravida  5   Para  4   Term  4   Preterm      AB  1   Living  4     SAB  1   TAB  0   Ectopic  0   Multiple  0   Live Births  4           Family History  Problem Relation Age of Onset  . Hypertension Father   . Cancer Maternal Grandmother   . Cancer Paternal Grandmother   . Diabetes Paternal Grandfather   . Thyroid disease Sister   . ADD / ADHD Brother     Social History   Tobacco Use  . Smoking status: Current Every Day Smoker    Packs/day: 0.25    Types: Cigarettes  . Smokeless tobacco: Never Used  . Tobacco comment: smokes 1 cigarette a day  Substance Use Topics  . Alcohol use: Not Currently  . Drug use: No    Home Medications Prior to Admission medications   Medication Sig Start Date End Date Taking? Authorizing Provider  acetaminophen (TYLENOL) 325 MG tablet Take 2 tablets (650 mg total) by mouth every 6 (six) hours as needed. 01/23/19   Nugent, Gerrie Nordmann, NP  ibuprofen (ADVIL) 600 MG tablet Take 1 tablet (600 mg total) by mouth every 6 (six) hours as needed. 01/23/19   Nugent, Gerrie Nordmann, NP  levETIRAcetam (KEPPRA) 1000 MG tablet Take 1 tablet (1,000 mg total) by mouth 2 (two) times daily.  11/17/18 01/22/19  Leftwich-Kirby, Wilmer Floor, CNM  nitrofurantoin, macrocrystal-monohydrate, (MACROBID) 100 MG capsule Take 1 capsule (100 mg total) by mouth at bedtime. Patient not taking: Reported on 12/23/2018 12/12/18   Levie Heritage, DO  Prenatal Vit-Fe Fumarate-FA (PRENATAL MULTIVITAMIN) TABS tablet Take 1 tablet by mouth daily at 12 noon.    [provider]  senna-docusate (SENOKOT-S) 8.6-50 MG tablet Take 2 tablets by mouth at bedtime as needed for mild constipation. 01/23/19   Nugent, Odie Sera, NP    Allergies    Nickel  Review of Systems   Review of Systems  Constitutional: Negative.  Negative for chills and fever.  HENT: Positive for congestion, ear pain and rhinorrhea. Negative for facial  swelling, sinus pressure, sore throat, trouble swallowing and voice change.   Respiratory: Positive for cough. Negative for chest tightness and shortness of breath.   Cardiovascular: Negative.  Negative for chest pain and leg swelling.  Gastrointestinal: Negative.  Negative for abdominal pain, diarrhea, nausea and vomiting.  Musculoskeletal: Negative.  Negative for neck pain and neck stiffness.  Neurological: Negative.  Negative for dizziness, weakness and numbness.     Physical Exam Updated Vital Signs BP 133/83 (BP Location: Left Arm)   Pulse 80   Temp 98.3 F (36.8 C) (Oral)   Resp 18   Ht 5\' 4"  (1.626 m)   Wt 61.2 kg   SpO2 96%   BMI 23.17 kg/m   Physical Exam Constitutional:      General: She is not in acute distress.    Appearance: Normal appearance. She is well-developed. She is not ill-appearing or diaphoretic.  HENT:     Head: Normocephalic and atraumatic.     Jaw: There is normal jaw occlusion. No trismus.     Right Ear: Tympanic membrane and external ear normal. No hemotympanum.     Left Ear: Tympanic membrane and external ear normal. No hemotympanum.     Nose: Rhinorrhea present. Rhinorrhea is clear.     Right Sinus: No maxillary sinus tenderness or frontal sinus tenderness.     Left Sinus: No maxillary sinus tenderness or frontal sinus tenderness.     Mouth/Throat:     Mouth: Mucous membranes are moist.     Pharynx: Oropharynx is clear.     Comments: The patient has normal phonation and is in control of secretions. No stridor.  Midline uvula without edema. Soft palate rises symmetrically. No tonsillar erythema, swelling or exudates. Tongue protrusion is normal, floor of mouth is soft. No trismus. No creptius on neck palpation. No gingival erythema or fluctuance noted. Mucus membranes moist. No pallor noted. Eyes:     General: Vision grossly intact. Gaze aligned appropriately.     Extraocular Movements: Extraocular movements intact.     Conjunctiva/sclera:  Conjunctivae normal.     Pupils: Pupils are equal, round, and reactive to light.  Neck:     Trachea: Trachea and phonation normal. No tracheal deviation.     Meningeal: Brudzinski's sign absent.  Cardiovascular:     Rate and Rhythm: Normal rate and regular rhythm.     Pulses: Normal pulses.     Heart sounds: Normal heart sounds.  Pulmonary:     Effort: Pulmonary effort is normal. No accessory muscle usage or respiratory distress.     Breath sounds: Normal breath sounds and air entry.  Abdominal:     General: There is no distension.     Palpations: Abdomen is soft.     Tenderness: There is no abdominal  tenderness. There is no guarding or rebound.  Musculoskeletal:        General: Normal range of motion.     Cervical back: Normal range of motion and neck supple.     Right lower leg: No edema.     Left lower leg: No edema.  Skin:    General: Skin is warm and dry.  Neurological:     Mental Status: She is alert.     GCS: GCS eye subscore is 4. GCS verbal subscore is 5. GCS motor subscore is 6.     Gait: Abnormal gait:      Comments: Speech is clear and goal oriented, follows commands Major Cranial nerves without deficit, no facial droop Moves extremities without ataxia, coordination intact  Psychiatric:        Behavior: Behavior normal.     ED Results / Procedures / Treatments   Labs (all labs ordered are listed, but only abnormal results are displayed) Labs Reviewed  SARS CORONAVIRUS 2 BY RT PCR (HOSPITAL ORDER, PERFORMED IN [facility])  SARS CORONAVIRUS 2 (TAT 6-24 HRS)    EKG None  Radiology No results found.  Procedures Procedures (including critical care time)  Medications Ordered in ED Medications - No data to display  ED Course  I have reviewed the triage vital signs and the nursing notes.  Pertinent labs & imaging results that were available during my care of the patient were reviewed by me and considered in my medical decision making (see  chart for details).     Renee Velez was evaluated in Emergency Department on 06/17/2019 for the symptoms described in the history of present illness. She was evaluated in the context of the global COVID-19 pandemic, which necessitated consideration that the patient might be at risk for infection with the SARS-CoV-2 virus that causes COVID-19. Institutional protocols and algorithms that pertain to the evaluation of patients at risk for COVID-19 are in a state of rapid change based on information released by regulatory bodies including the CDC and federal and state organizations. These policies and algorithms were followed during the patient's care in the ED.  MDM Rules/Calculators/A&P                     24 year old female presents today for Covid symptoms x5 days.  Loss of taste smell, rhinorrhea, nonproductive cough, otalgia and fatigue.  She is well-appearing no acute distress.  Cranial nerves intact, no meningeal signs, airway patent without evidence of PTA, RPA, Ludwick's, pharyngitis or other infections.  Bilateral tympanic membranes without evidence of infection or perforation, no evidence of mastoiditis.  Heart regular rate and rhythm, lungs clear bilaterally, abdomen soft nontender, neurovascular intact to all 4 extremities without evidence of DVT.  Vital signs stable without fever, tachycardia, hypotension or hypoxia on room air.  Appears patient has upper respiratory tract infection at this time likely viral no evidence of bacterial infection necessitating antibiotics at this time.  Rapid Covid test was obtained in triage and was negative.  We will send out the follow-up Covid test as patient has not had her Covid vaccines.  Patient informed to quarantine until 10 days after symptom onset, continue water rehydration and follow-up with PCP.  Additionally I offered patient a chest x-ray today however she refused stating she is only here for Covid test and would like to be discharged, feels is  reasonable at this time doubt pneumonia or other cardiopulmonary etiology at this time.  She appears stable for  discharge and PCP follow-up.  At this time there does not appear to be any evidence of an acute emergency medical condition and the patient appears stable for discharge with appropriate outpatient follow up. Diagnosis was discussed with patient who verbalizes understanding of care plan and is agreeable to discharge. I have discussed return precautions with patient who verbalizes understanding. Patient encouraged to follow-up with their PCP. All questions answered.  Note: Portions of this report may have been transcribed using voice recognition software. Every effort was made to ensure accuracy; however, inadvertent computerized transcription errors may still be present. Final Clinical Impression(s) / ED Diagnoses Final diagnoses:  Viral upper respiratory tract infection  Suspected COVID-19 virus infection    Rx / DC Orders ED Discharge Orders    None       Elizabeth Palau 06/17/19 [id number]    Alvira Monday, MD 06/17/19 [id number]

## 2019-06-17 NOTE — Discharge Instructions (Signed)
At this time there does not appear to be the presence of an emergent medical condition, however there is always the potential for conditions to change. Please read and follow the below instructions.  Please return to the Emergency Department immediately for any new or worsening symptoms. Please be sure to follow up with your Primary Care Provider within one week regarding your visit today; please call their office to schedule an appointment even if you are feeling better for a follow-up visit. Your send out Covid test is currently pending and will result in the next 1-2 days.  Check your MyChart account for the results and discuss them with your primary care provider when available.  There is a possibility of a false negative Covid test so please continue to quarantine until 10 days after your symptoms first began to avoid spread of any viral illness to others.  Drink enough water to avoid dehydration and get plenty of rest.  Get help right away if: You have shortness of breath or chest pain You have very bad or constant: Headache. Ear pain. Pain in your forehead, behind your eyes, and over your cheekbones (sinus pain). Chest pain. You have long-lasting (chronic) lung disease along with any of these: Wheezing. Long-lasting cough. Coughing up blood. A change in your usual mucus. You have a stiff neck. You have changes in your: Vision. Hearing. Thinking. Mood. You have any new/concerning or worsening of symptoms  Please read the additional information packets attached to your discharge summary.  Do not take your medicine if  develop an itchy rash, swelling in your mouth or lips, or difficulty breathing; call 911 and seek immediate emergency medical attention if this occurs.  Note: Portions of this text may have been transcribed using voice recognition software. Every effort was made to ensure accuracy; however, inadvertent computerized transcription errors may still be present.

## 2019-06-17 NOTE — ED Triage Notes (Signed)
Pt reports loss of taste and smell since 5/10. Reports chills, headache, otalgia, and cough. States that she has not left home and has no sick contacts. Pt has a 14 month old and is nursing.

## 2019-10-24 ENCOUNTER — Other Ambulatory Visit: Payer: Self-pay

## 2019-10-24 ENCOUNTER — Emergency Department (HOSPITAL_COMMUNITY)
Admission: EM | Admit: 2019-10-24 | Discharge: 2019-10-24 | Disposition: A | Discharge status: Home / Self Care | Payer: Medicaid Other | Attending: Emergency Medicine | Admitting: Emergency Medicine

## 2019-10-24 ENCOUNTER — Encounter (HOSPITAL_COMMUNITY): Payer: Self-pay | Admitting: Emergency Medicine

## 2019-10-24 DIAGNOSIS — Y92410 Unspecified street and highway as the place of occurrence of the external cause: Secondary | ICD-10-CM | POA: Insufficient documentation

## 2019-10-24 DIAGNOSIS — M545 Low back pain: Secondary | ICD-10-CM | POA: Diagnosis not present

## 2019-10-24 DIAGNOSIS — R0781 Pleurodynia: Secondary | ICD-10-CM | POA: Insufficient documentation

## 2019-10-24 DIAGNOSIS — Z5321 Procedure and treatment not carried out due to patient leaving prior to being seen by health care provider: Secondary | ICD-10-CM | POA: Diagnosis not present

## 2019-10-24 NOTE — ED Notes (Signed)
Patient called for treatment room. States she is leaving.
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
## 2019-10-26 ENCOUNTER — Emergency Department (HOSPITAL_COMMUNITY): Payer: Medicaid Other

## 2019-10-26 ENCOUNTER — Other Ambulatory Visit: Payer: Self-pay

## 2019-10-26 ENCOUNTER — Encounter (HOSPITAL_COMMUNITY): Payer: Self-pay | Admitting: Emergency Medicine

## 2019-10-26 ENCOUNTER — Emergency Department (HOSPITAL_COMMUNITY)
Admission: EM | Admit: 2019-10-26 | Discharge: 2019-10-27 | Disposition: A | Discharge status: Home / Self Care | Payer: Medicaid Other | Attending: Emergency Medicine | Admitting: Emergency Medicine

## 2019-10-26 DIAGNOSIS — M545 Low back pain: Secondary | ICD-10-CM | POA: Diagnosis not present

## 2019-10-26 DIAGNOSIS — R0781 Pleurodynia: Secondary | ICD-10-CM | POA: Diagnosis not present

## 2019-10-26 DIAGNOSIS — Z5321 Procedure and treatment not carried out due to patient leaving prior to being seen by health care provider: Secondary | ICD-10-CM | POA: Diagnosis not present
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
## 2019-12-27 ENCOUNTER — Inpatient Hospital Stay (HOSPITAL_COMMUNITY)
Admission: AD | Admit: 2019-12-27 | Discharge: 2019-12-27 | Disposition: A | Discharge status: Home / Self Care | Payer: Medicaid Other | Attending: Obstetrics and Gynecology | Admitting: Obstetrics and Gynecology

## 2019-12-27 ENCOUNTER — Other Ambulatory Visit: Payer: Self-pay

## 2019-12-27 ENCOUNTER — Encounter (HOSPITAL_COMMUNITY): Payer: Self-pay | Admitting: Obstetrics and Gynecology

## 2019-12-27 ENCOUNTER — Inpatient Hospital Stay (HOSPITAL_COMMUNITY): Payer: Medicaid Other

## 2019-12-27 DIAGNOSIS — O468X1 Other antepartum hemorrhage, first trimester: Secondary | ICD-10-CM

## 2019-12-27 DIAGNOSIS — Z6791 Unspecified blood type, Rh negative: Secondary | ICD-10-CM | POA: Diagnosis not present

## 2019-12-27 DIAGNOSIS — O99331 Smoking (tobacco) complicating pregnancy, first trimester: Secondary | ICD-10-CM | POA: Insufficient documentation

## 2019-12-27 DIAGNOSIS — O4690 Antepartum hemorrhage, unspecified, unspecified trimester: Secondary | ICD-10-CM

## 2019-12-27 DIAGNOSIS — O208 Other hemorrhage in early pregnancy: Secondary | ICD-10-CM | POA: Insufficient documentation

## 2019-12-27 DIAGNOSIS — F17210 Nicotine dependence, cigarettes, uncomplicated: Secondary | ICD-10-CM | POA: Insufficient documentation

## 2019-12-27 DIAGNOSIS — Z3A13 13 weeks gestation of pregnancy: Secondary | ICD-10-CM | POA: Diagnosis not present

## 2019-12-27 DIAGNOSIS — O360110 Maternal care for anti-D [Rh] antibodies, first trimester, not applicable or unspecified: Secondary | ICD-10-CM

## 2019-12-27 DIAGNOSIS — O418X10 Other specified disorders of amniotic fluid and membranes, first trimester, not applicable or unspecified: Secondary | ICD-10-CM | POA: Insufficient documentation

## 2019-12-27 DIAGNOSIS — O26899 Other specified pregnancy related conditions, unspecified trimester: Secondary | ICD-10-CM

## 2019-12-27 LAB — CBC
HCT: 35 % — ABNORMAL LOW (ref 36.0–46.0)
Hemoglobin: 11.8 g/dL — ABNORMAL LOW (ref 12.0–15.0)
MCH: 29.8 pg (ref 26.0–34.0)
MCHC: 33.7 g/dL (ref 30.0–36.0)
MCV: 88.4 fL (ref 80.0–100.0)
Platelets: 287 10*3/uL (ref 150–400)
RBC: 3.96 MIL/uL (ref 3.87–5.11)
RDW: 13.2 % (ref 11.5–15.5)
WBC: 12.2 10*3/uL — ABNORMAL HIGH (ref 4.0–10.5)
nRBC: 0 % (ref 0.0–0.2)

## 2019-12-27 LAB — URINALYSIS, ROUTINE W REFLEX MICROSCOPIC
Bilirubin Urine: NEGATIVE
Glucose, UA: NEGATIVE mg/dL
Hgb urine dipstick: NEGATIVE
Ketones, ur: 20 mg/dL — AB
Leukocytes,Ua: NEGATIVE
Nitrite: NEGATIVE
Protein, ur: NEGATIVE mg/dL
Specific Gravity, Urine: 1.012 (ref 1.005–1.030)
pH: 6 (ref 5.0–8.0)

## 2019-12-27 LAB — WET PREP, GENITAL
Sperm: NONE SEEN
Trich, Wet Prep: NONE SEEN
Yeast Wet Prep HPF POC: NONE SEEN

## 2019-12-27 MED ORDER — RHO D IMMUNE GLOBULIN 1500 UNIT/2ML IJ SOSY
300.0000 ug | PREFILLED_SYRINGE | Freq: Once | INTRAMUSCULAR | Status: AC
  Administered 2019-12-27: 300 ug via INTRAMUSCULAR
  Filled 2019-12-27: qty 2

## 2019-12-27 NOTE — MAU Provider Note (Signed)
History     CSN: [id number]  Arrival date and time: 12/27/19 1427   First Provider Initiated Contact with Patient 12/27/19 1509      Chief Complaint  Patient presents with  . Vaginal Bleeding   [name] is a 24 y.o. [id number] at [redacted]w[redacted]d who presents to MAU for vaginal bleeding which began about 1.5 hours ago. Patient reports she felt a large gush of blood that was dark red and brown that happened x1, without continued bleeding. Pad in MAU does not have any blood. Patient tearful during discussion with provider saying she has not had the opportunity to get to an OB because of transportation issues and lack of support from family and husband for childcare. Patient declines feeling like she wants to hurt herself or someone else.  Passing blood clots? no Blood soaking clothes? no Lightheaded/dizzy? no Significant pelvic pain or cramping? no Passed any tissue? no  Current pregnancy problems? Pt hss not yet been seen Blood Type? A NEGATIVE Allergies? Nickel Current medications? Keppra, 1mg  folic acid Current PNC & next appt? Pt does not yet have OB provider  Pt denies vaginal discharge/odor/itching. Pt denies N/V, abdominal pain, constipation, diarrhea, or urinary problems. Pt denies fever, chills, fatigue, sweating or changes in appetite. Pt denies SOB or chest pain. Pt denies dizziness, HA, light-headedness, weakness.   OB History    Gravida  6   Para  4   Term  4   Preterm      AB  1   Living  4     SAB  1   TAB  0   Ectopic  0   Multiple  0   Live Births  4           Past Medical History:  Diagnosis Date  . Anxiety   . Chlamydia   . Epilepsy (HCC)   . Gonorrhea   . Post partum depression 2018  . Seizure [facility])     Past Surgical History:  Procedure Laterality Date  . NO PAST SURGERIES      Family History  Problem Relation Age of Onset  . Hypertension Father   . Cancer Maternal Grandmother   . Cancer Paternal Grandmother   . Diabetes  Paternal Grandfather   . Thyroid disease Sister   . ADD / ADHD Brother     Social History   Tobacco Use  . Smoking status: Current Every Day Smoker    Packs/day: 1.00    Types: Cigarettes  . Smokeless tobacco: Never Used  Vaping Use  . Vaping Use: Never used  Substance Use Topics  . Alcohol use: Not Currently  . Drug use: No    Allergies:  Allergies  Allergen Reactions  . Nickel Rash    Medications Prior to Admission  Medication Sig Dispense Refill Last Dose  . folic acid (FOLVITE) 1 MG tablet Take 1 mg by mouth daily.   12/27/2019 at Unknown time  . levETIRAcetam (KEPPRA) 1000 MG tablet Take 1 tablet (1,000 mg total) by mouth 2 (two) times daily. 60 tablet 5 12/27/2019 at Unknown time  . Prenatal Vit-Fe Fumarate-FA (PRENATAL MULTIVITAMIN) TABS tablet Take 1 tablet by mouth daily at 12 noon.   12/27/2019 at Unknown time  . acetaminophen (TYLENOL) 325 MG tablet Take 2 tablets (650 mg total) by mouth every 6 (six) hours as needed. 30 tablet 0   . ibuprofen (ADVIL) 600 MG tablet Take 1 tablet (600 mg total) by mouth every 6 (six) hours  as needed. 30 tablet 0   . nitrofurantoin, macrocrystal-monohydrate, (MACROBID) 100 MG capsule Take 1 capsule (100 mg total) by mouth at bedtime. (Patient not taking: Reported on 12/23/2018) 30 capsule 3   . senna-docusate (SENOKOT-S) 8.6-50 MG tablet Take 2 tablets by mouth at bedtime as needed for mild constipation. 30 tablet 0     Review of Systems  Constitutional: Negative for chills, diaphoresis, fatigue and fever.  Eyes: Negative for visual disturbance.  Respiratory: Negative for shortness of breath.   Cardiovascular: Negative for chest pain.  Gastrointestinal: Negative for abdominal pain, constipation, diarrhea, nausea and vomiting.  Genitourinary: Positive for vaginal bleeding. Negative for dysuria, flank pain, frequency, pelvic pain, urgency and vaginal discharge.  Neurological: Negative for dizziness, weakness, light-headedness and  headaches.   Physical Exam   Blood pressure 105/66, pulse 78, temperature 98.3 F (36.8 C), temperature source Oral, resp. rate 18, weight 60.7 kg, last menstrual period 09/22/2019, SpO2 99 %, not currently breastfeeding.  Patient Vitals for the past 24 hrs:  BP Temp Temp src Pulse Resp SpO2 Weight  12/27/19 1453 105/66 -- -- 78 -- -- --  12/27/19 1443 (!) 120/96 98.3 F (36.8 C) Oral 90 18 99 % 60.7 kg   Physical Exam Vitals and nursing note reviewed.  Constitutional:      General: She is not in acute distress.    Appearance: Normal appearance. She is normal weight. She is not ill-appearing, toxic-appearing or diaphoretic.  HENT:     Head: Normocephalic and atraumatic.  Pulmonary:     Effort: Pulmonary effort is normal.  Neurological:     Mental Status: She is alert and oriented to person, place, and time.  Psychiatric:        Mood and Affect: Mood normal.        Behavior: Behavior normal.        Thought Content: Thought content normal.        Judgment: Judgment normal.    Results for orders placed or performed during the hospital encounter of 12/27/19 (from the past 24 hour(s))  Urinalysis, Routine w reflex microscopic Urine, Clean Catch     Status: Abnormal   Collection Time: 12/27/19  2:36 PM  Result Value Ref Range   Color, Urine YELLOW YELLOW   APPearance HAZY (A) CLEAR   Specific Gravity, Urine 1.012 1.005 - 1.030   pH 6.0 5.0 - 8.0   Glucose, UA NEGATIVE NEGATIVE mg/dL   Hgb urine dipstick NEGATIVE NEGATIVE   Bilirubin Urine NEGATIVE NEGATIVE   Ketones, ur 20 (A) NEGATIVE mg/dL   Protein, ur NEGATIVE NEGATIVE mg/dL   Nitrite NEGATIVE NEGATIVE   Leukocytes,Ua NEGATIVE NEGATIVE  CBC     Status: Abnormal   Collection Time: 12/27/19  3:28 PM  Result Value Ref Range   WBC 12.2 (H) 4.0 - 10.5 K/uL   RBC 3.96 3.87 - 5.11 MIL/uL   Hemoglobin 11.8 (L) 12.0 - 15.0 g/dL   HCT 19.135.0 (L) 36 - 46 %   MCV 88.4 80.0 - 100.0 fL   MCH 29.8 26.0 - 34.0 pg   MCHC 33.7 30.0 -  36.0 g/dL   RDW 47.813.2 29.511.5 - 62.115.5 %   Platelets 287 150 - 400 K/uL   nRBC 0.0 0.0 - 0.2 %  Rh IG workup (includes ABO/Rh)     Status: None (Preliminary result)   Collection Time: 12/27/19  3:28 PM  Result Value Ref Range   Gestational Age(Wks) 13.5    ABO/RH(D) A NEG  Antibody Screen NEG    Unit Number [id number]    Blood Component Type RHIG    Unit division 00    Status of Unit ISSUED    Transfusion Status      OK TO TRANSFUSE Performed at [facility], [address] [place], [place] [address]   Wet prep, genital     Status: Abnormal   Collection Time: 12/27/19  3:32 PM   Specimen: Vaginal  Result Value Ref Range   Yeast Wet Prep HPF POC NONE SEEN NONE SEEN   Trich, Wet Prep NONE SEEN NONE SEEN   Clue Cells Wet Prep HPF POC PRESENT (A) NONE SEEN   WBC, Wet Prep HPF POC MODERATE (A) NONE SEEN   Sperm NONE SEEN    US OB Comp Less 14 Wks  Result Date: 12/27/2019 CLINICAL DATA:  Vaginal bleeding started 1 hour ago. Last menstrual period 09/22/2019. Gestational age by last menstrual. Thirteen weeks and 5 days. Due date by last menstrual period 06/28/2020. EXAM: OBSTETRIC <14 WK [place] AND TRANSVAGINAL OB US TECHNIQUE: Both transabdominal and transvaginal ultrasound examinations were performed for complete evaluation of the gestation as well as the maternal uterus, adnexal regions, and pelvic cul-de-sac. Transvaginal technique was performed to assess early pregnancy. COMPARISON:  None. FINDINGS: Intrauterine gestational sac: Single Yolk sac:  Not Visualized. Embryo:  Visualized. Cardiac Activity: Visualized. Heart Rate: 155 bpm CRL:  64.4 mm   12 w   6 d                  [place] EDC: 07/04/2020 Subchorionic hemorrhage:  Small volume. Maternal uterus/adnexae: The uterus is otherwise unremarkable. The right and left ovaries are unremarkable with the right ovary measuring 2.4 x 1.3 x 2.2 cm and the left ovary measuring 1.4 x 1.5 x 1.4 cm. IMPRESSION: 1. Single live intrauterine pregnancy  with gestational age by ultrasound (12 weeks and 6 days) concordant with gestational age by last menstrual period (13 weeks and 5 days). 2. Small volume subchorionic hemorrhage. Electronically Signed   By: Tish Frederickson M.D.   On: 12/27/2019 16:09    MAU Course  Procedures  MDM -VB in pregnancy -FHT 160 -UA: hazy/20ketones -CBC: WNL -[place]: single IUP, FHR 155, [redacted]w[redacted]d, sm SCH -RH negative -RhoGAM given -WetPrep: +ClueCells (isolated finding not requiring treatment) -GC/CT collected -offered social work and TTS consult, pt declines -pt denies SI/HI -offered BH, pt declines at this time -pt discharged to home in stable condition  Orders Placed This Encounter  Procedures  . Wet prep, genital    Standing Status:   Standing    Number of Occurrences:   1  . US OB Comp Less 14 Wks    Standing Status:   Standing    Number of Occurrences:   1    Order Specific Question:   Symptom/Reason for Exam    Answer:   Vaginal bleeding in pregnancy [[id number]]  . Urinalysis, Routine w reflex microscopic Urine, Clean Catch    Standing Status:   Standing    Number of Occurrences:   1  . CBC    Standing Status:   Standing    Number of Occurrences:   1  . Rh IG workup (includes ABO/Rh)    Standing Status:   Standing    Number of Occurrences:   1    Order Specific Question:   Weeks of Gestation    Answer:   13.5  . Discharge patient    Order Specific  Question:   Discharge disposition    Answer:   01-Home or Self Care [1]    Order Specific Question:   Discharge patient date    Answer:   12/27/2019   Meds ordered this encounter  Medications  . rho (d) immune globulin (RHIG/RHOPHYLAC) injection 300 mcg    Assessment and Plan   1. Subchorionic hemorrhage of placenta in first trimester, single or unspecified fetus   2. Vaginal bleeding in pregnancy   3. Rh negative state in antepartum period     Allergies as of 12/27/2019      Reactions   Nickel Rash      Medication List    STOP  taking these medications   ibuprofen 600 MG tablet Commonly known as: ADVIL     TAKE these medications   acetaminophen 325 MG tablet Commonly known as: Tylenol Take 2 tablets (650 mg total) by mouth every 6 (six) hours as needed.   folic acid 1 MG tablet Commonly known as: FOLVITE Take 1 mg by mouth daily.   levETIRAcetam 1000 MG tablet Commonly known as: KEPPRA Take 1 tablet (1,000 mg total) by mouth 2 (two) times daily.   nitrofurantoin (macrocrystal-monohydrate) 100 MG capsule Commonly known as: MACROBID Take 1 capsule (100 mg total) by mouth at bedtime.   prenatal multivitamin Tabs tablet Take 1 tablet by mouth daily at 12 noon.   senna-docusate 8.6-50 MG tablet Commonly known as: Senokot-S Take 2 tablets by mouth at bedtime as needed for mild constipation.      -will call with culture results, if positive -message sent to [facility] to help coordinate transportation and NOB visit -pt advised of shelters for women and children -pt advised if she needs a safe place for her and her children and does not know where else to go, she can come to MAU for a safe place to stay while social work helps with placement -discussed expected s/sx of [facility] -return MAU precautions given -pt discharged to home in stable condition  Odie Sera Mansi Tokar 12/27/2019, 4:46 PM

## 2019-12-27 NOTE — Discharge Instructions (Signed)

## 2019-12-27 NOTE — MAU Note (Signed)
Patient reports to MAU with vaginal bleeding that started an hour ago.  States that blood gushed out at first and she has been bleeding since.  FHR: 160 with doppler.

## 2019-12-28 LAB — RH IG WORKUP (INCLUDES ABO/RH)
ABO/RH(D): A NEG
Antibody Screen: NEGATIVE
Gestational Age(Wks): 13.5
Unit division: 0

## 2019-12-29 LAB — GC/CHLAMYDIA PROBE AMP (~~LOC~~) NOT AT ARMC
Chlamydia: NEGATIVE
Comment: NEGATIVE
Comment: NORMAL
Neisseria Gonorrhea: NEGATIVE

## 2020-01-03 ENCOUNTER — Telehealth: Payer: Self-pay | Admitting: General Practice

## 2020-01-03 NOTE — Telephone Encounter (Signed)
Called patient regarding upcoming appt and difficulty with transportation. Patient states she found transportation to her appt on Monday. Provided transportation services phone number for future use if needed. Patient verbalized understanding.

## 2020-01-08 ENCOUNTER — Encounter: Payer: Self-pay | Admitting: Obstetrics and Gynecology

## 2020-01-08 ENCOUNTER — Other Ambulatory Visit (HOSPITAL_COMMUNITY)
Admission: RE | Admit: 2020-01-08 | Discharge: 2020-01-08 | Disposition: A | Discharge status: Home / Self Care | Payer: Medicaid Other | Source: Ambulatory Visit | Attending: Obstetrics and Gynecology | Admitting: Obstetrics and Gynecology

## 2020-01-08 ENCOUNTER — Other Ambulatory Visit: Payer: Self-pay

## 2020-01-08 ENCOUNTER — Ambulatory Visit (INDEPENDENT_AMBULATORY_CARE_PROVIDER_SITE_OTHER): Payer: Medicaid Other | Admitting: Obstetrics and Gynecology

## 2020-01-08 VITALS — BP 112/61 | HR 67 | Wt 133.3 lb

## 2020-01-08 DIAGNOSIS — O0990 Supervision of high risk pregnancy, unspecified, unspecified trimester: Secondary | ICD-10-CM | POA: Insufficient documentation

## 2020-01-08 DIAGNOSIS — Z6791 Unspecified blood type, Rh negative: Secondary | ICD-10-CM

## 2020-01-08 DIAGNOSIS — Z3A15 15 weeks gestation of pregnancy: Secondary | ICD-10-CM

## 2020-01-08 DIAGNOSIS — Z23 Encounter for immunization: Secondary | ICD-10-CM

## 2020-01-08 DIAGNOSIS — F17200 Nicotine dependence, unspecified, uncomplicated: Secondary | ICD-10-CM

## 2020-01-08 DIAGNOSIS — O219 Vomiting of pregnancy, unspecified: Secondary | ICD-10-CM

## 2020-01-08 DIAGNOSIS — G40909 Epilepsy, unspecified, not intractable, without status epilepticus: Secondary | ICD-10-CM

## 2020-01-08 DIAGNOSIS — O26899 Other specified pregnancy related conditions, unspecified trimester: Secondary | ICD-10-CM

## 2020-01-08 LAB — POCT URINALYSIS DIP (DEVICE)
Bilirubin Urine: NEGATIVE
Glucose, UA: NEGATIVE mg/dL
Hgb urine dipstick: NEGATIVE
Ketones, ur: 15 mg/dL — AB
Nitrite: NEGATIVE
Protein, ur: NEGATIVE mg/dL
Specific Gravity, Urine: 1.02 (ref 1.005–1.030)
Urobilinogen, UA: 2 mg/dL — ABNORMAL HIGH (ref 0.0–1.0)
pH: 7 (ref 5.0–8.0)

## 2020-01-08 MED ORDER — LEVETIRACETAM 1000 MG PO TABS: 1000.0000 mg | ORAL_TABLET | Freq: Two times a day (BID) | ORAL | 5 refills | Status: DC

## 2020-01-08 MED ORDER — DOXYLAMINE SUCCINATE (SLEEP) 25 MG PO TABS: 25.0000 mg | ORAL_TABLET | Freq: Four times a day (QID) | ORAL | 2 refills | Status: DC | PRN

## 2020-01-08 MED ORDER — FOLIC ACID 1 MG PO TABS: 1.0000 mg | ORAL_TABLET | Freq: Every day | ORAL | 3 refills | Status: DC

## 2020-01-08 MED ORDER — NICOTINE POLACRILEX 2 MG MT GUM: 2.0000 mg | CHEWING_GUM | OROMUCOSAL | 0 refills | Status: DC | PRN

## 2020-01-08 MED ORDER — PYRIDOXINE HCL 25 MG PO TABS: 25.0000 mg | ORAL_TABLET | Freq: Four times a day (QID) | ORAL | 3 refills | Status: DC | PRN

## 2020-01-08 MED ORDER — BLOOD PRESSURE KIT DEVI: 1.0000 | 0 refills | Status: DC | PRN
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
## 2020-01-08 NOTE — Progress Notes (Signed)
Home Medicaid Form completed Flu vaccine today NEW OB packet provided

## 2020-01-09 ENCOUNTER — Encounter: Payer: Self-pay | Admitting: *Deleted

## 2020-01-10 LAB — CBC/D/PLT+RPR+RH+ABO+RUB AB...
Basophils Absolute: 0.1 10*3/uL (ref 0.0–0.2)
Basos: 1 %
EOS (ABSOLUTE): 0.1 10*3/uL (ref 0.0–0.4)
Eos: 1 %
HCV Ab: <0.1 s/co ratio (ref 0.0–0.9)
HIV Screen 4th Generation wRfx: NONREACTIVE
Hematocrit: 34.7 % (ref 34.0–46.6)
Hemoglobin: 12 g/dL (ref 11.1–15.9)
Hepatitis B Surface Ag: NEGATIVE
Immature Grans (Abs): 0 10*3/uL (ref 0.0–0.1)
Immature Granulocytes: 0 %
Lymphocytes Absolute: 2.1 10*3/uL (ref 0.7–3.1)
Lymphs: 20 %
MCH: 30 pg (ref 26.6–33.0)
MCHC: 34.6 g/dL (ref 31.5–35.7)
MCV: 87 fL (ref 79–97)
Monocytes Absolute: 0.7 10*3/uL (ref 0.1–0.9)
Monocytes: 6 %
Neutrophils Absolute: 7.6 10*3/uL — ABNORMAL HIGH (ref 1.4–7.0)
Neutrophils: 72 %
Platelets: 270 10*3/uL (ref 150–450)
RBC: 4 x10E6/uL (ref 3.77–5.28)
RDW: 12.9 % (ref 11.7–15.4)
RPR Ser Ql: NONREACTIVE
Rh Factor: NEGATIVE
Rubella Antibodies, IGG: 1.17 index (ref >0.99)
WBC: 10.6 10*3/uL (ref 3.4–10.8)

## 2020-01-10 LAB — AB SCR+ANTIBODY ID: Antibody Screen: POSITIVE — AB

## 2020-01-10 LAB — CULTURE, OB URINE

## 2020-01-10 LAB — HCV INTERPRETATION

## 2020-01-10 LAB — URINE CULTURE, OB REFLEX

## 2020-01-11 LAB — CYTOLOGY - PAP

## 2020-01-14 ENCOUNTER — Other Ambulatory Visit: Payer: Self-pay

## 2020-01-15 ENCOUNTER — Encounter: Payer: Self-pay | Admitting: *Deleted

## 2020-01-15 DIAGNOSIS — R87619 Unspecified abnormal cytological findings in specimens from cervix uteri: Secondary | ICD-10-CM | POA: Insufficient documentation

## 2020-01-18 ENCOUNTER — Encounter: Payer: Self-pay | Admitting: *Deleted

## 2020-01-29 ENCOUNTER — Encounter: Payer: Self-pay | Admitting: Family Medicine

## 2020-01-29 ENCOUNTER — Telehealth: Payer: Self-pay | Admitting: Lactation Services

## 2020-01-29 DIAGNOSIS — Z141 Cystic fibrosis carrier: Secondary | ICD-10-CM | POA: Insufficient documentation

## 2020-01-29 NOTE — Telephone Encounter (Signed)
Called patient to inform her of [facility] Genetic Screening Results showing Carrier for Cystic Fibrosis. Patient did not answer. LM to check My Chart message and if has questions or concerns to call the office for results

## 2020-02-03 NOTE — L&D Delivery Note (Signed)
OB/GYN Faculty Practice Delivery Note  Renee Velez is a 25 y.o. [id number] s/p vaginal delivery at [redacted]w[redacted]d. She was admitted for elective IOL.   ROM: 2h 90m with clear fluid GBS Status: negative Maximum Maternal Temperature: 98.43F  Labor Progress: On admission, pt received cytotec x1. AROM for large volume clear fluid at 2105. Given recurrent, deep variable decels with contractions, IUPC was placed and amnioinfusion was started. Pt then rapidly progressed to complete cervical dilation at 2351. She had an uncomplicated delivery s/p a brief second stage.  Delivery Date/Time: 06/23/20 at 2356 Delivery: Patient was complete and pushing. Head delivered ROA. No nuchal cord present. Shoulder and body delivered in usual fashion. Infant with spontaneous cry, placed on mother's abdomen, dried and stimulated. Cord clamped x 2 after 1-minute delay, and cut by FOB under my direct supervision. Cord blood drawn. Placenta delivered spontaneously with gentle cord traction. Fundus firm with massage and Pitocin. Labia, perineum, vagina, and cervix were inspected, without evidence of lacerations. Given multiparity, TXA was administered in addition to postpartum pitocin   Placenta: 3-vessel cord, intact, sent to L&D Complications: none Lacerations: none EBL: 400 ml Analgesia: epidural  Infant: viable female  APGARs 8 & 9  weight 2761 g  Lynnda Shields, MD OB/GYN Fellow, Faculty Practice

## 2020-02-09 ENCOUNTER — Ambulatory Visit: Payer: No Typology Code available for payment source

## 2020-02-09 ENCOUNTER — Encounter: Payer: Medicaid Other | Admitting: Obstetrics and Gynecology

## 2020-02-12 ENCOUNTER — Other Ambulatory Visit: Payer: Self-pay

## 2020-02-12 ENCOUNTER — Ambulatory Visit (INDEPENDENT_AMBULATORY_CARE_PROVIDER_SITE_OTHER): Payer: Medicaid Other | Admitting: Obstetrics & Gynecology

## 2020-02-12 VITALS — BP 115/60 | HR 64 | Wt 132.3 lb

## 2020-02-12 DIAGNOSIS — Z8759 Personal history of other complications of pregnancy, childbirth and the puerperium: Secondary | ICD-10-CM

## 2020-02-12 DIAGNOSIS — G40909 Epilepsy, unspecified, not intractable, without status epilepticus: Secondary | ICD-10-CM

## 2020-02-12 DIAGNOSIS — R87619 Unspecified abnormal cytological findings in specimens from cervix uteri: Secondary | ICD-10-CM

## 2020-02-12 DIAGNOSIS — F319 Bipolar disorder, unspecified: Secondary | ICD-10-CM

## 2020-02-12 DIAGNOSIS — F17200 Nicotine dependence, unspecified, uncomplicated: Secondary | ICD-10-CM

## 2020-02-12 DIAGNOSIS — O3440 Maternal care for other abnormalities of cervix, unspecified trimester: Secondary | ICD-10-CM

## 2020-02-12 DIAGNOSIS — Z3A20 20 weeks gestation of pregnancy: Secondary | ICD-10-CM

## 2020-02-12 NOTE — Progress Notes (Signed)
   PRENATAL VISIT NOTE  Subjective:  Renee Velez is a 25 y.o. [id number] at [redacted]w[redacted]d being seen today for ongoing prenatal care.  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  Patient reports no complaints.  Contractions: Not present. Vag. Bleeding: None.  Movement: Absent. Denies leaking of fluid.   The following portions of the patient's history were reviewed and updated as appropriate: allergies, current medications, past family history, past medical history, past social history, past surgical history and problem list.   Objective:   Vitals:   02/12/20 1018  BP: 115/60  Pulse: 64  Weight: 132 lb 4.8 oz (60 kg)    Fetal Status: Fetal Heart Rate (bpm): 145   Movement: Absent     General:  Alert, oriented and cooperative. Patient is in no acute distress.  Skin: Skin is warm and dry. No rash noted.   Cardiovascular: Normal heart rate noted  Respiratory: Normal respiratory effort, no problems with respiration noted  Abdomen: Soft, gravid, appropriate for gestational age.  Pain/Pressure: Absent     Pelvic: Cervical exam deferred        Extremities: Normal range of motion.  Edema: None  Mental Status: Normal mood and affect. Normal behavior. Normal judgment and thought content.   Assessment and Plan:  Pregnancy: [id number] at [redacted]w[redacted]d 1. Antepartum abnormal cervical Papanicolaou smear complicating pregnancy LSIL needs colpo  2. Seizure disorder (HCC) stable  3. Bipolar 1 disorder (HCC)   4. Tobacco use disorder   5. History of  gestational hypertension Nl BP  Preterm labor symptoms and general obstetric precautions including but not limited to vaginal bleeding, contractions, leaking of fluid and fetal movement were reviewed in detail with the patient. Please refer to After Visit Summary for other counseling recommendations.   Return in about 4 weeks (around 03/11/2020) for colposcopy.  No future appointments.  Scheryl Darter, MD

## 2020-02-12 NOTE — Patient Instructions (Signed)
[email or web address]  Colposcopy  Colposcopy is a procedure to examine the lowest part of the uterus (cervix), the vagina, and the area around the vaginal opening (vulva) for abnormalities or signs of disease. This procedure is done using an instrument that makes objects appear larger and provides light. (colposcope). During the procedure, the health care provider may remove a tissue sample to be looked at later under a microscope (biopsy). A biopsy may be done if any unusual cells are found during the colposcopy. You may have a colposcopy if you have:  An abnormal Pap smear, also called a Pap test. This screening test is used to check for signs of cancer or infection of the vagina, cervix, and uterus.  An HPV (human papillomavirus) test and get a positive result for a type of HPV that puts you at high risk of cancer.  Certain conditions or symptoms, such as: ? A sore, or lesion, on your cervix. ? Genital warts on your vulva, vagina, or cervix. ? Pain during sex. ? Vaginal bleeding, especially after sex.  A growth on your cervix (cervical polyp) that needs to be removed. Let your health care provider know about:  Any allergies you have, including allergies to medicines, latex, or iodine.  All medicines you are taking, including vitamins, herbs, eye drops, creams, and over-the-counter medicines.  Any problems you or family members have had with anesthetic medicines.  Any blood disorders you have.  Any surgeries you have had.  Any medical conditions you have, such as pelvic inflammatory disease (PID) or endometrial disorder.  The pattern of your menstrual cycles and the form of birth control (contraception) you use, if any.  Your medical history, including any history of fainting often or of cervical treatment.  Whether you are pregnant or may be pregnant. What are the risks? Generally, this is a safe  procedure. However, problems may occur, including:  Infection. Symptoms of infection may include fever, bad-smelling vaginal discharge, or pelvic pain.  Allergic reactions to medicines.  Damage to nearby structures or organs.  Fainting. This is rare. What happens before the procedure? Eating and drinking restrictions  Follow instructions from your health care provider about eating or drinking restrictions.  You will likely need to eat a regular diet the day of the procedure and not skip any meals. Tests  You may have an exam or testing. A pregnancy test will be done the day of the procedure.  You may have a blood or urine sample taken. General instructions  Ask your health care provider about: ? Changing or stopping your regular medicines. This is especially important if you are taking diabetes medicines or blood thinners. ? Taking medicines such as aspirin and ibuprofen. These medicines can thin your blood. Do not take these medicines unless your health care provider tells you to take them. Your health care provider will likely tell you to avoid taking aspirin, or medicine that contains aspirin, for 7 days before the procedure. ? Taking over-the-counter medicines, vitamins, herbs, and supplements.  Tell your health care provider if you have your menstrual period now or will have it at the time of your procedure. A colposcopy is not normally done during your menstrual period.  If you use contraception, continue to use it before your procedure.  For 24 hours before the procedure: ? Do not use douche products or tampons. ? Do not use medicines, creams, or suppositories in the vagina. ? Do not have sex.  Ask your health care provider what steps will be   taken to prevent infection. What happens during the procedure?  You will lie down on your back, with your feet in foot rests (stirrups).  A tool called a speculum will be warmed and will have oil or gel put on it (will be  lubricated). The speculum will then be inserted into your vagina. This will be used to hold apart the walls of your vagina so your health care provider can see your cervix and the inside of your vagina.  A cotton swab will be used to place a small amount of a liquid (solution) on the areas to be examined. This solution makes it easier to see abnormal cells. You may feel a slight burning during this part.  The colposcope will be used to scan the cervix with a bright [place] light. The colposcope will be held near your vulva and will make your vulva, vagina, and cervix look bigger so they can be seen better.  If a biopsy is needed: ? You may be given a medicine to numb the area (local anesthetic). ? Surgical tools will be used to remove mucus and cells through your vagina. ? You may feel mild pain while the tissue sample is removed. ? Bleeding may occur. A solution may be used to stop the bleeding. ? If a biopsy is needed from the inside of the cervix, a different procedure called endocervical curettage (ECC) may be done. During this procedure, a curved tool called a curette will be used to scrape cells from your cervix or the top of your cervix (endocervix).  Any abnormalities that are found will be recorded. The procedure may vary among health care providers and hospitals. What happens after the procedure?  You will lie down and rest for a few minutes. You may be offered juice or cookies.  Your blood pressure, heart rate, breathing rate, and blood oxygen level will be monitored until you leave the hospital or clinic.  You may have some cramping in your abdomen. This should go away after a few minutes.  It is up to you to get the results of your procedure. Ask your health care provider, or the department that is doing the procedure, when your results will be ready. Summary  Colposcopy is a procedure to examine the lowest part of the uterus (cervix), the vagina, and the area around the vaginal  opening (vulva) for abnormalities or signs of disease.  A biopsy may be done as part of the procedure.  After the procedure, you will remain lying down and will rest for a few minutes.  You may have some cramping in your abdomen. This should go away after a few minutes. This information is not intended to replace advice given to you by your health care provider. Make sure you discuss any questions you have with your health care provider. Document Revised: 01/18/2019 Document Reviewed: 01/18/2019 Elsevier Patient Education  2021 [employer]  

## 2020-02-18 IMAGING — US US MFM UA CORD DOPPLER
1 series · 13 of 28 positions shown · non-contrast
Comparison: none

[Series 1: us mfm ua cord doppler · 13 of 53 slices shown]
[im 2/53]
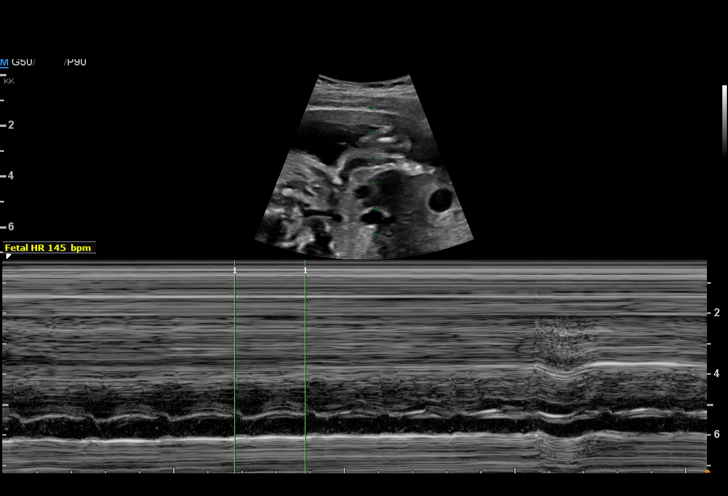
[im 6/53]
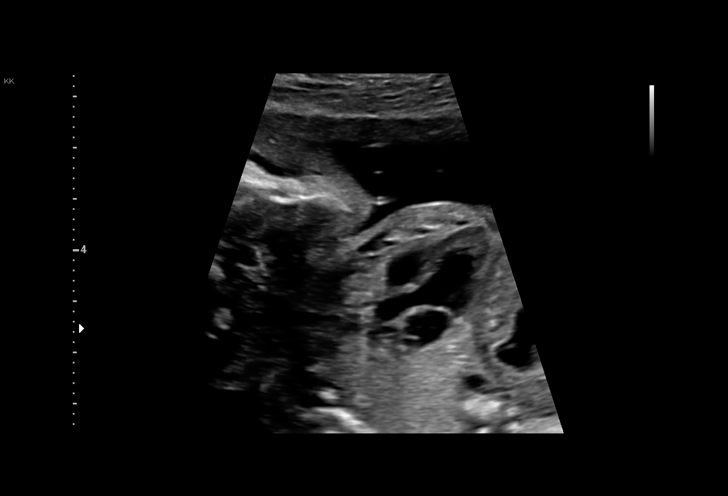
[im 10/53]
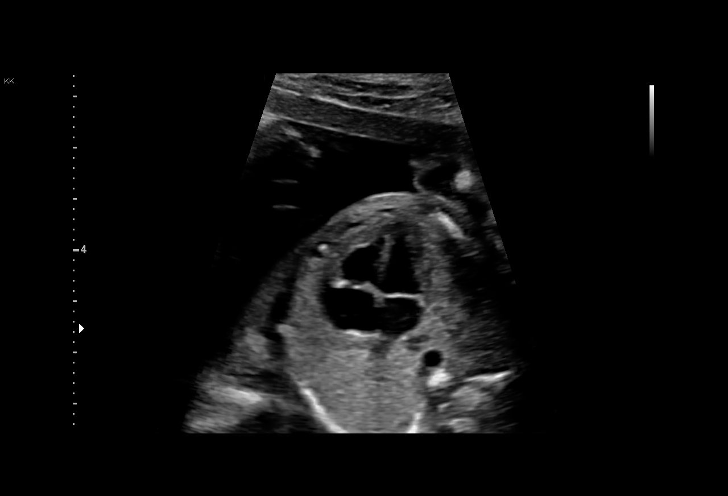
[im 14/53]
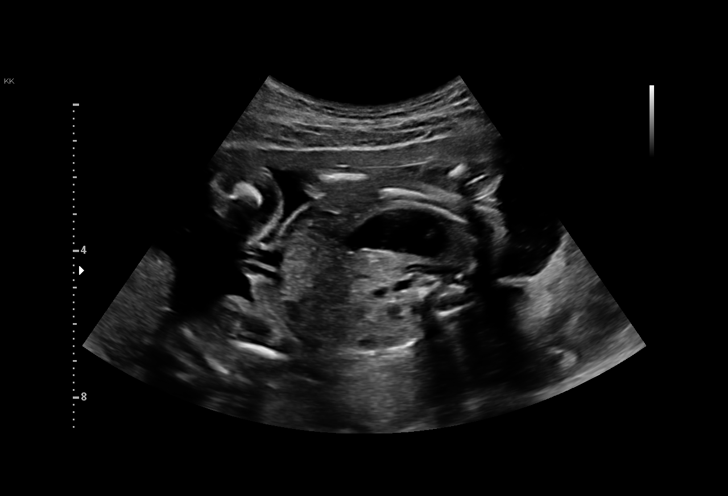
[im 18/53]
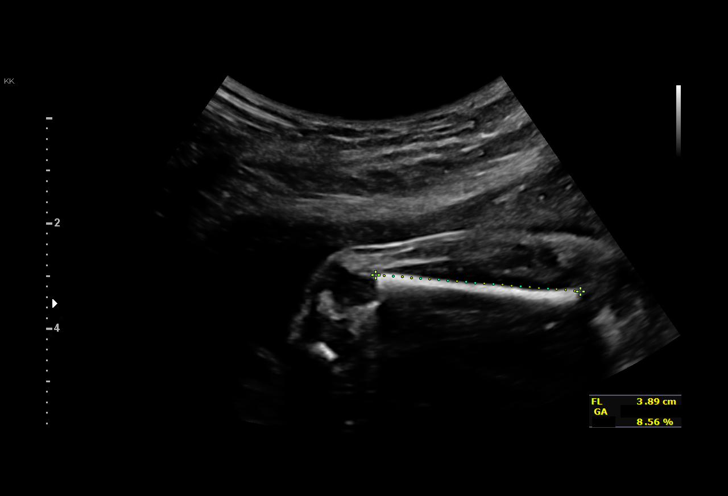
[im 22/53]
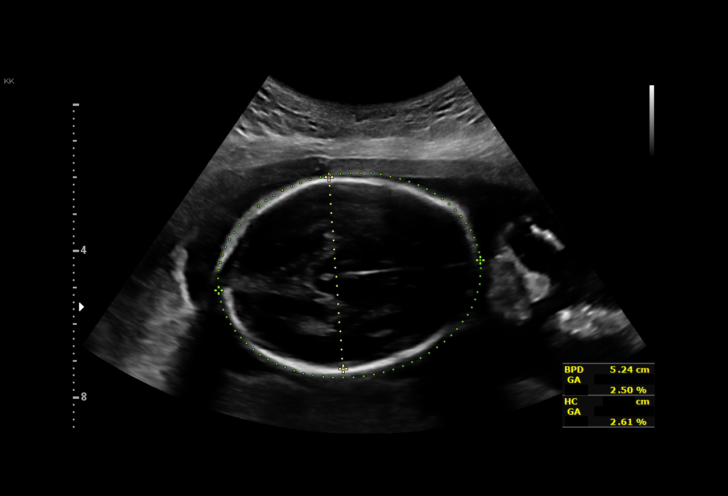
[im 27/53]
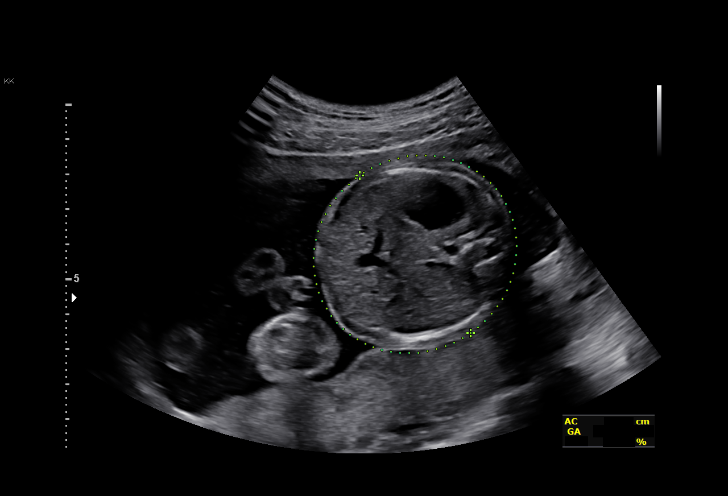
[im 31/53]
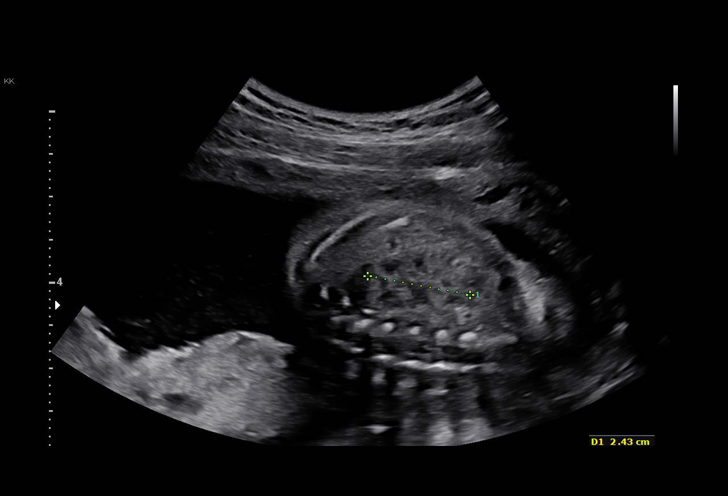
[im 35/53]
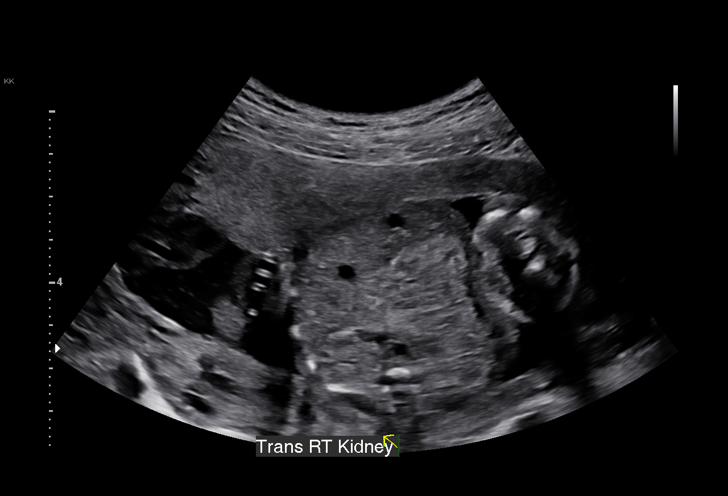
[im 39/53]
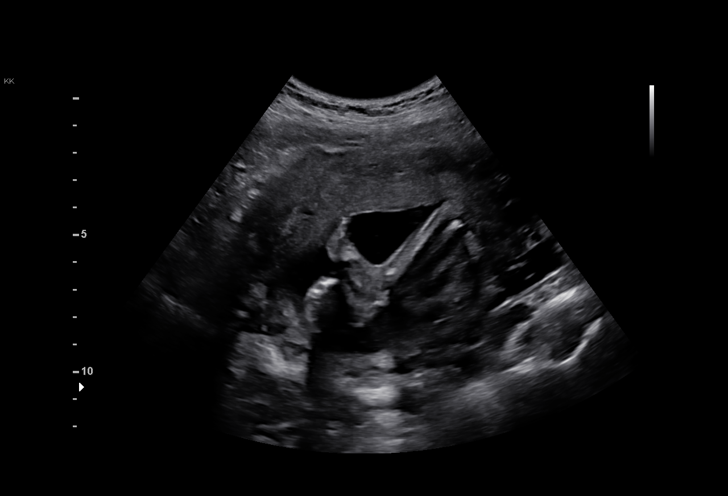
[im 43/53]
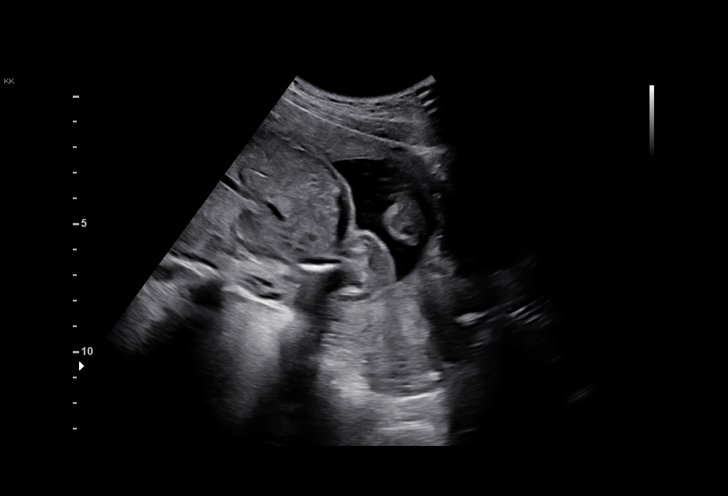
[im 47/53]
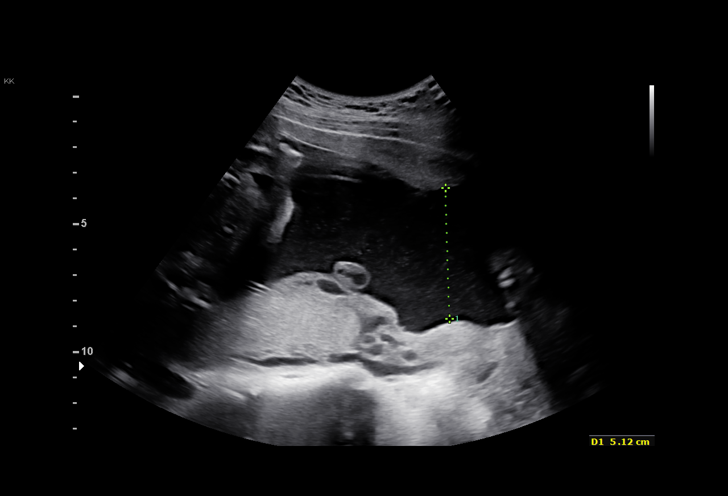
[im 51/53]
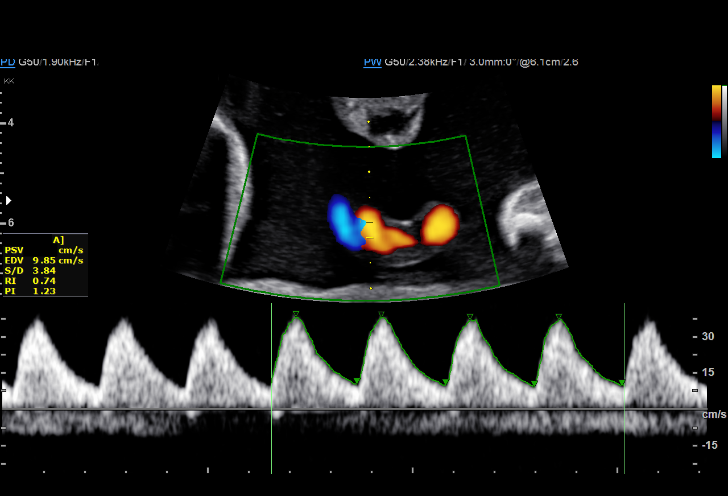

[13 of 28 positions shown; findings below may reference images not displayed]

1  US MFM UA CORD DOPPLER               76820.02     BEZUNARTEA
                                                       IBIRAHIMA
                                                       IBIRAHIMA
 ----------------------------------------------------------------------

 ----------------------------------------------------------------------
Indications

  Rh negative state in antepartum
  Poor obstetric history: Previous
  preeclampsia / eclampsia/gestational HTN
  Bipolar disease during pregnancy
  Seizure disorder
  23 weeks gestation of pregnancy
  Encounter for other antenatal screening
  follow-up
  Maternal care for known or suspected poor
  fetal growth, second trimester, not applicable
  or unspecified IUGR
 ----------------------------------------------------------------------
Vital Signs

 BMI:
Fetal Evaluation

 Num Of Fetuses:         1
 Fetal Heart Rate(bpm):  145
 Cardiac Activity:       Observed
 Presentation:           Breech
 Placenta:               Posterior
 P. Cord Insertion:      Previously Visualized
 Amniotic Fluid
 AFI FV:      Within normal limits

                             Largest Pocket(cm)

Biometry

 BPD:      52.3  mm     G. Age:  21w 6d          2  %    CI:        68.43   %    70 - 86
                                                         FL/HC:      19.4   %    18.7 -
 HC:      202.1  mm     G. Age:  22w 2d          3  %    HC/AC:      1.12        1.05 -
 AC:      180.6  mm     G. Age:  22w 6d         18  %    FL/BPD:     75.0   %    71 - 87
 FL:       39.2  mm     G. Age:  22w 4d         10  %    FL/AC:      21.7   %    20 - 24
 Est. FW:     526  gm      1 lb 3 oz      8  %
OB History

 Gravidity:    5         Term:   3        Prem:   0        SAB:   1
 TOP:          0       Ectopic:  0        Living: 3
Gestational Age

 LMP:           25w 1d        Date:  04/26/18                 EDD:   01/31/19
 U/S Today:     22w 3d                                        EDD:   02/19/19
 Best:          23w 5d     Det. By:  Early Ultrasound         EDD:   02/10/19
                                     (06/26/18)
Anatomy

 Cranium:               Appears normal         LVOT:                   Previously seen
 Cavum:                 Appears normal         Aortic Arch:            Appears normal
 Ventricles:            Appears normal         Ductal Arch:            Previously seen
 Choroid Plexus:        Previously seen        Diaphragm:              Appears normal
 Cerebellum:            Appears normal         Stomach:                Appears normal, left
                                                                       sided
 Posterior Fossa:       Previously seen        Abdomen:                Appears normal
 Nuchal Fold:           Not applicable (>20    Abdominal Wall:         Previously seen
                        wks GA)
 Face:                  Orbits and profile     Cord Vessels:           Previously seen
                        previously seen
 Lips:                  Previously seen        Kidneys:                Appear normal
 Palate:                Previously seen        Bladder:                Appears normal
 Thoracic:              Appears normal         Spine:                  Previously seen
 Heart:                 Appears normal         Upper Extremities:      Previously seen
                        (4CH, axis, and
                        situs)
 RVOT:                  Previously seen        Lower Extremities:      Previously seen

 Other:  Heels and 5th digit previously visualized. Parents do not wish to know
         sex of fetus. Nasal bone previously visualized.
Doppler - Fetal Vessels

 Umbilical Artery
  S/D     %tile     RI              PI                     ADFV    RDFV
 4.07       74   0.75             1.29                        No      No

Cervix Uterus Adnexa
 Cervix
 Length:            3.4  cm.
 Normal appearance by transabdominal scan.
Impression

 Suspected fetal growth restriction. Patient returned for fetal
 growth and umbilical artery Doppler studies.

 On ultrasound, the estimated fetal weight is at the 8th
 percentile. Head circumference measurement is between -1
 SD and mean. Amniotic fluid is normal and good fetal activity
 is seen. Umbilical artery Doppler showed normal forward
 diastolic flow.

 We reassured the patient of the findings.
Recommendations

 -Follow-up UA Doppler in 2 weeks.
 -Fetal growth and UA Doppler in 4 weeks.
                 Cleber, Katuhiko

## 2020-02-26 ENCOUNTER — Other Ambulatory Visit: Payer: Self-pay | Admitting: Obstetrics and Gynecology

## 2020-02-26 ENCOUNTER — Other Ambulatory Visit: Payer: Self-pay | Admitting: *Deleted

## 2020-02-26 ENCOUNTER — Ambulatory Visit: Payer: Medicaid Other | Attending: Obstetrics and Gynecology

## 2020-02-26 ENCOUNTER — Ambulatory Visit: Payer: Medicaid Other | Admitting: *Deleted

## 2020-02-26 ENCOUNTER — Encounter: Payer: Self-pay | Admitting: *Deleted

## 2020-02-26 ENCOUNTER — Other Ambulatory Visit: Payer: Self-pay

## 2020-02-26 DIAGNOSIS — R87619 Unspecified abnormal cytological findings in specimens from cervix uteri: Secondary | ICD-10-CM | POA: Diagnosis present

## 2020-02-26 DIAGNOSIS — O0990 Supervision of high risk pregnancy, unspecified, unspecified trimester: Secondary | ICD-10-CM

## 2020-02-26 DIAGNOSIS — O3440 Maternal care for other abnormalities of cervix, unspecified trimester: Secondary | ICD-10-CM

## 2020-02-26 DIAGNOSIS — O365990 Maternal care for other known or suspected poor fetal growth, unspecified trimester, not applicable or unspecified: Secondary | ICD-10-CM

## 2020-03-02 IMAGING — US US MFM UA CORD DOPPLER
1 series · 15 of 23 positions shown · non-contrast
Comparison: none

[Series 1: us mfm ua cord doppler · 23 acquisitions, 15 frames shown]
[im 1/23]
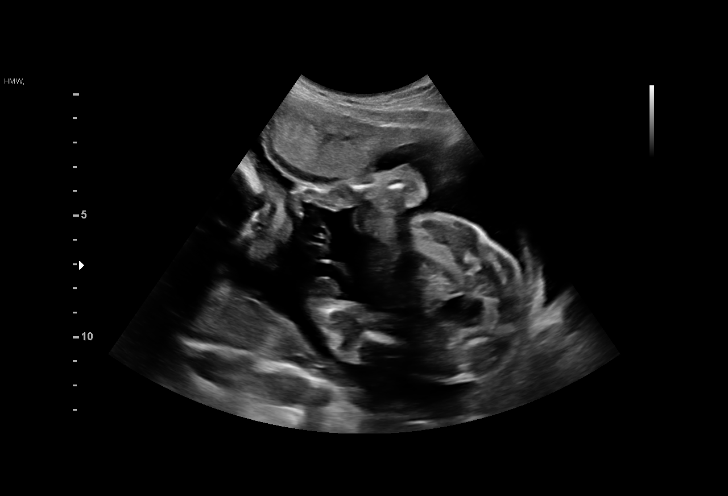
[im 3/23]
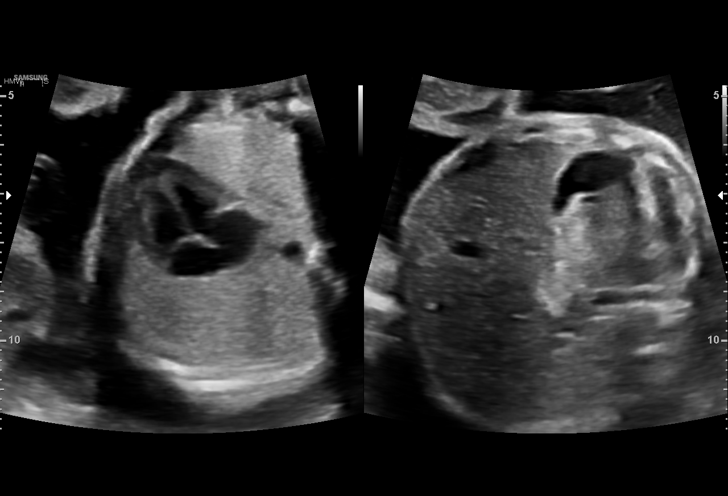
[im 4/23]
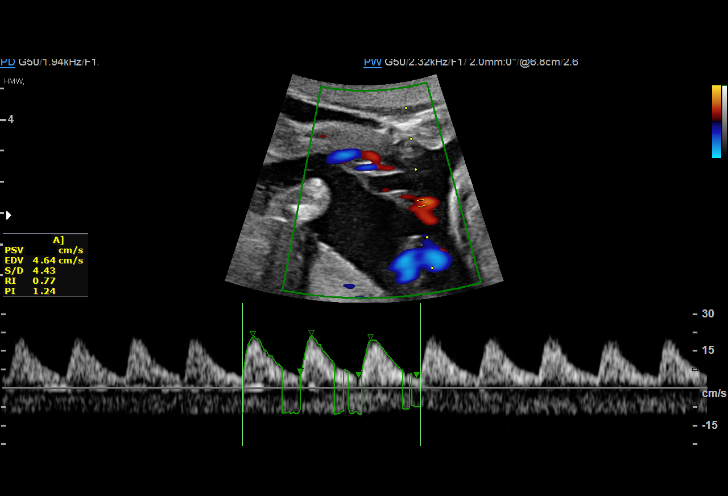
[im 6/23]
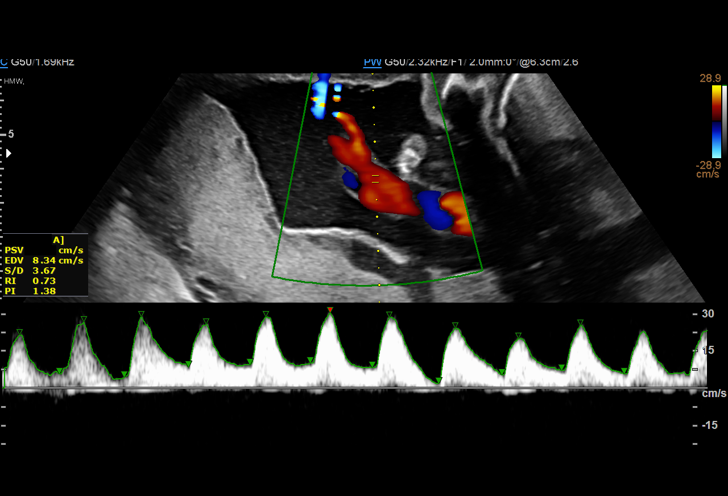
[im 7/23]
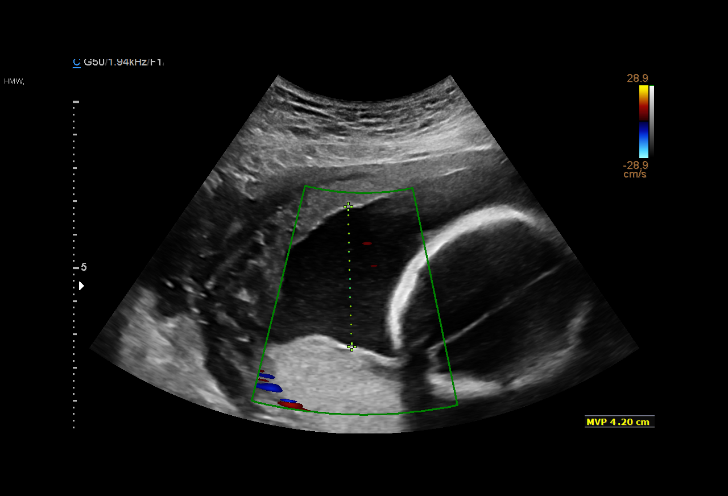
[im 9/23]
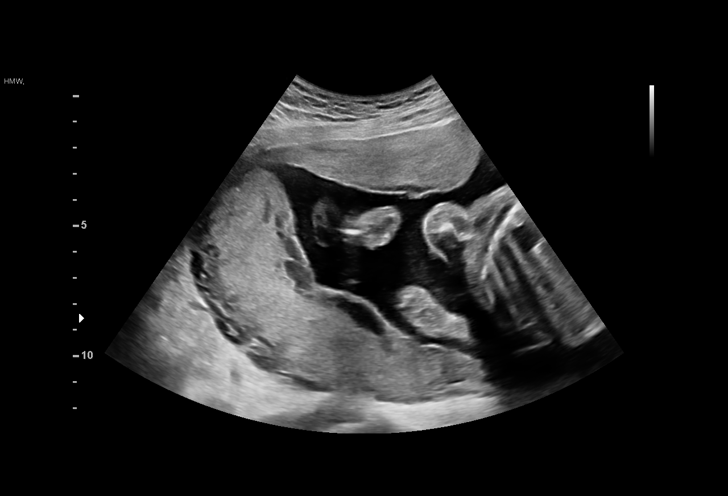
[im 10/23]
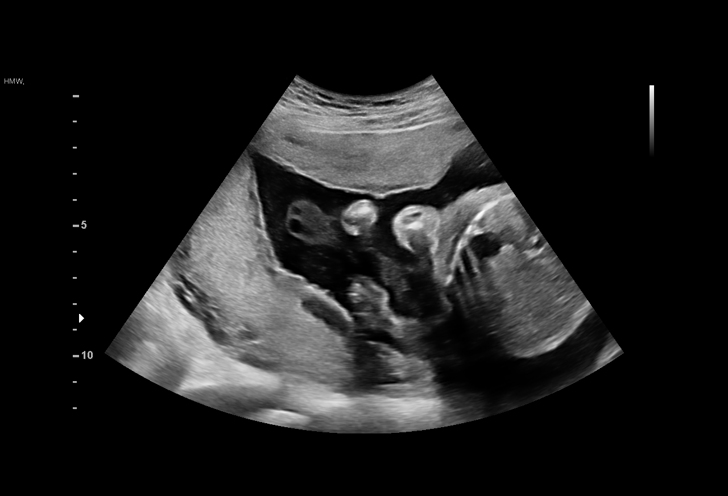
[im 12/23]
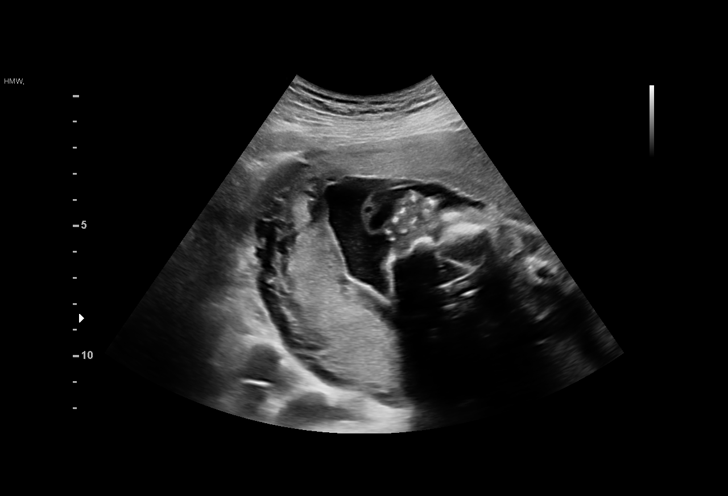
[im 14/23]
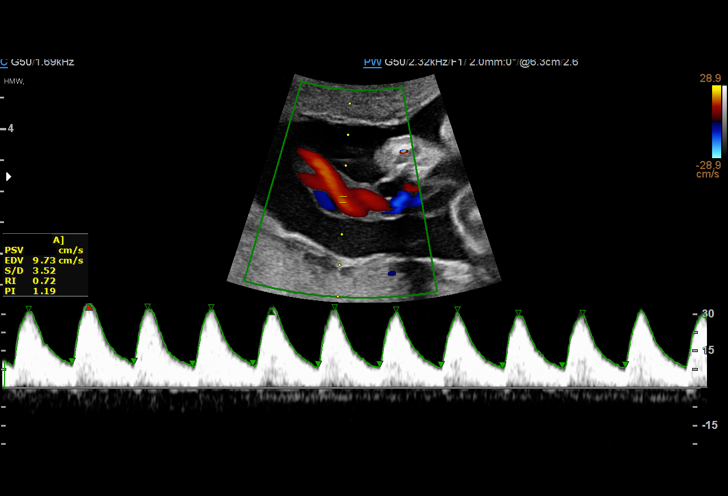
[im 15/23]
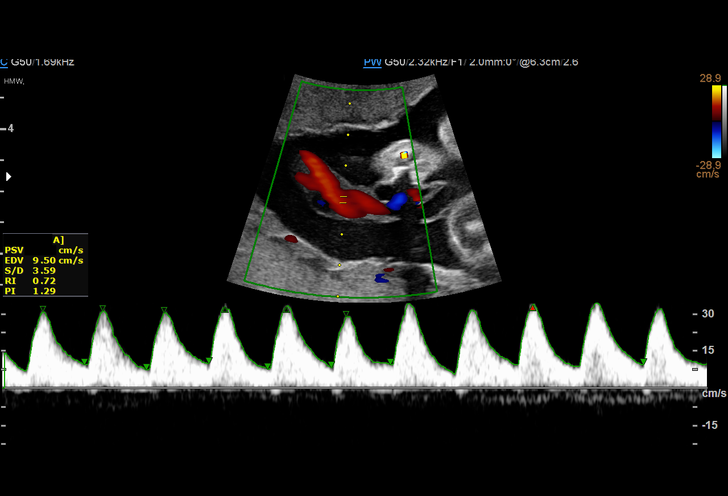
[im 17/23]
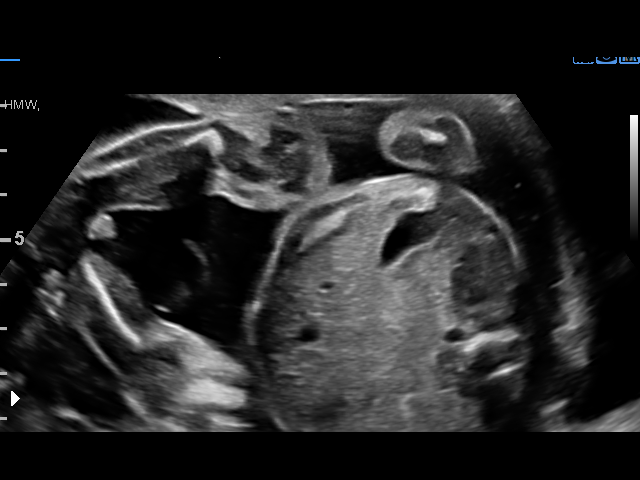
[im 18/23]
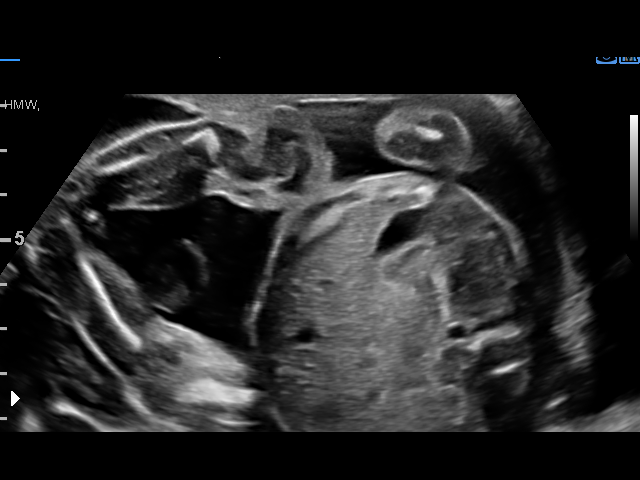
[im 20/23]
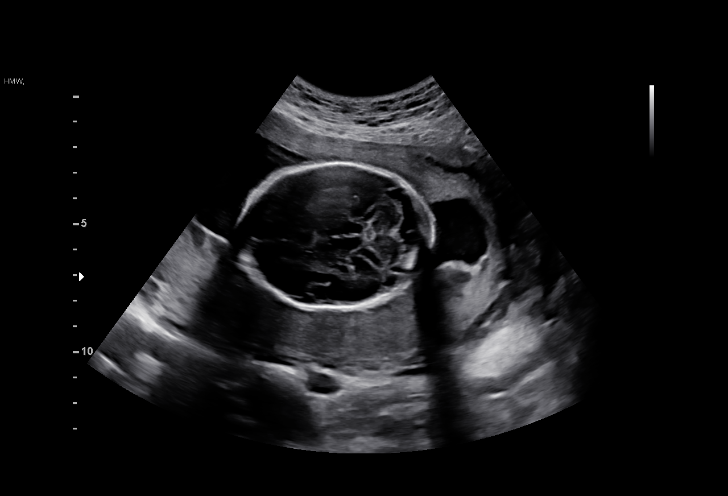
[im 21/23]
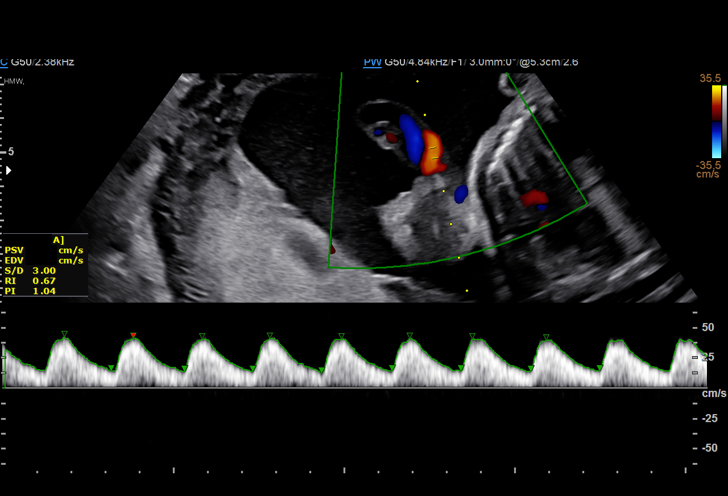
[im 23/23]
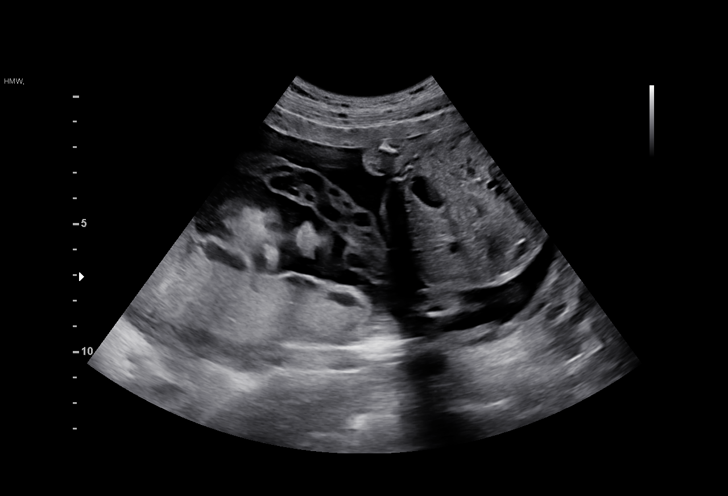

[15 of 23 positions shown; findings below may reference images not displayed]

----------------------------------------------------------------------

 ----------------------------------------------------------------------
Indications

  Poor obstetric history: Previous
  preeclampsia / eclampsia/gestational HTN
  Rh negative state in antepartum
  Bipolar disease during pregnancy
  Seizure disorder
  25 weeks gestation of pregnancy
 ----------------------------------------------------------------------
Vital Signs

                                                Height:        5'4"
Fetal Evaluation

 Num Of Fetuses:         1
 Fetal Heart Rate(bpm):  135
 Cardiac Activity:       Observed
 Presentation:           Breech
 Placenta:               Posterior
 P. Cord Insertion:      Previously Visualized

 Amniotic Fluid
 AFI FV:      Within normal limits

                             Largest Pocket(cm)

OB History
 Gravidity:    5         Term:   3        Prem:   0        SAB:   1
 TOP:          0       Ectopic:  0        Living: 3
Gestational Age

 LMP:           27w 0d        Date:  04/26/18                 EDD:   01/31/19
 Best:          25w 4d     Det. By:  Early Ultrasound         EDD:   02/10/19
                                     (06/26/18)
Anatomy

 Thoracic:              Appears normal         Bladder:                Appears normal
 Stomach:               Appears normal, left
                        sided
Doppler - Fetal Vessels

 Umbilical Artery
  S/D     %tile     RI              PI                     ADFV    RDFV
    3       35   0.67             1.04                        No      No

Comments

 This patient was seen for umbilical artery Doppler studies due
 to an IUGR fetus.  She denies any problems since her last
 exam.

 Doppler studies of the umbilical arteries performed today
 showed a normal S/D ratio of 3.00.  There were no signs of
 absent or reversed end-diastolic flow.  There was normal
 amniotic fluid noted.

 She will return in 1 week for another umbilical artery Doppler
 study.

## 2020-03-12 ENCOUNTER — Encounter: Payer: Medicaid Other | Admitting: Family Medicine

## 2020-03-12 ENCOUNTER — Telehealth: Payer: Self-pay

## 2020-03-12 NOTE — Telephone Encounter (Signed)
Call placed to pt. Left message with pt about missed appt today. Advised pt on VM to call our office to reschedule.  Pt was sent Mychart message today by front office.   Judeth Cornfield, RN  03/12/20 Encounter closed

## 2020-03-18 ENCOUNTER — Encounter: Payer: Self-pay | Admitting: *Deleted

## 2020-03-18 ENCOUNTER — Ambulatory Visit: Payer: Medicaid Other | Admitting: *Deleted

## 2020-03-18 ENCOUNTER — Other Ambulatory Visit: Payer: Self-pay

## 2020-03-18 ENCOUNTER — Ambulatory Visit: Payer: Medicaid Other | Attending: Obstetrics

## 2020-03-18 ENCOUNTER — Ambulatory Visit: Payer: Medicaid Other

## 2020-03-18 ENCOUNTER — Other Ambulatory Visit: Payer: Self-pay | Admitting: *Deleted

## 2020-03-18 DIAGNOSIS — O352XX0 Maternal care for (suspected) hereditary disease in fetus, not applicable or unspecified: Secondary | ICD-10-CM

## 2020-03-18 DIAGNOSIS — O365990 Maternal care for other known or suspected poor fetal growth, unspecified trimester, not applicable or unspecified: Secondary | ICD-10-CM | POA: Insufficient documentation

## 2020-03-18 DIAGNOSIS — O0990 Supervision of high risk pregnancy, unspecified, unspecified trimester: Secondary | ICD-10-CM | POA: Insufficient documentation

## 2020-03-18 DIAGNOSIS — O99352 Diseases of the nervous system complicating pregnancy, second trimester: Secondary | ICD-10-CM

## 2020-03-18 DIAGNOSIS — G40909 Epilepsy, unspecified, not intractable, without status epilepticus: Secondary | ICD-10-CM

## 2020-03-18 DIAGNOSIS — O360120 Maternal care for anti-D [Rh] antibodies, second trimester, not applicable or unspecified: Secondary | ICD-10-CM

## 2020-03-18 DIAGNOSIS — Z148 Genetic carrier of other disease: Secondary | ICD-10-CM

## 2020-03-18 DIAGNOSIS — R87619 Unspecified abnormal cytological findings in specimens from cervix uteri: Secondary | ICD-10-CM

## 2020-03-18 DIAGNOSIS — O3440 Maternal care for other abnormalities of cervix, unspecified trimester: Secondary | ICD-10-CM

## 2020-03-18 DIAGNOSIS — O09292 Supervision of pregnancy with other poor reproductive or obstetric history, second trimester: Secondary | ICD-10-CM

## 2020-03-18 DIAGNOSIS — O365920 Maternal care for other known or suspected poor fetal growth, second trimester, not applicable or unspecified: Secondary | ICD-10-CM

## 2020-03-18 DIAGNOSIS — Z3A25 25 weeks gestation of pregnancy: Secondary | ICD-10-CM

## 2020-04-15 ENCOUNTER — Ambulatory Visit: Payer: Medicaid Other

## 2020-04-17 ENCOUNTER — Telehealth: Payer: Self-pay

## 2020-04-17 NOTE — Telephone Encounter (Signed)
Misty Stanley, [facility] prenatal navigator, brought it to my attention that pt has not been seen in office recently and has no upcoming appointments. Navigator has been unable to reach pt. Called pt; VM left with call back number. MyChart message sent.

## 2020-04-25 ENCOUNTER — Encounter: Payer: Self-pay | Admitting: *Deleted

## 2020-04-25 ENCOUNTER — Other Ambulatory Visit: Payer: Self-pay

## 2020-04-25 ENCOUNTER — Other Ambulatory Visit: Payer: Self-pay | Admitting: *Deleted

## 2020-04-25 ENCOUNTER — Ambulatory Visit: Payer: Medicaid Other | Attending: Obstetrics

## 2020-04-25 ENCOUNTER — Ambulatory Visit: Payer: Medicaid Other | Admitting: *Deleted

## 2020-04-25 DIAGNOSIS — O3440 Maternal care for other abnormalities of cervix, unspecified trimester: Secondary | ICD-10-CM | POA: Diagnosis present

## 2020-04-25 DIAGNOSIS — G40909 Epilepsy, unspecified, not intractable, without status epilepticus: Secondary | ICD-10-CM | POA: Diagnosis not present

## 2020-04-25 DIAGNOSIS — O360130 Maternal care for anti-D [Rh] antibodies, third trimester, not applicable or unspecified: Secondary | ICD-10-CM | POA: Diagnosis not present

## 2020-04-25 DIAGNOSIS — O0990 Supervision of high risk pregnancy, unspecified, unspecified trimester: Secondary | ICD-10-CM

## 2020-04-25 DIAGNOSIS — O09893 Supervision of other high risk pregnancies, third trimester: Secondary | ICD-10-CM

## 2020-04-25 DIAGNOSIS — O365930 Maternal care for other known or suspected poor fetal growth, third trimester, not applicable or unspecified: Secondary | ICD-10-CM

## 2020-04-25 DIAGNOSIS — Z3A30 30 weeks gestation of pregnancy: Secondary | ICD-10-CM

## 2020-04-25 DIAGNOSIS — R87619 Unspecified abnormal cytological findings in specimens from cervix uteri: Secondary | ICD-10-CM | POA: Insufficient documentation

## 2020-04-25 DIAGNOSIS — O99353 Diseases of the nervous system complicating pregnancy, third trimester: Secondary | ICD-10-CM

## 2020-04-25 DIAGNOSIS — O365990 Maternal care for other known or suspected poor fetal growth, unspecified trimester, not applicable or unspecified: Secondary | ICD-10-CM

## 2020-04-25 DIAGNOSIS — Z8759 Personal history of other complications of pregnancy, childbirth and the puerperium: Secondary | ICD-10-CM

## 2020-05-06 ENCOUNTER — Other Ambulatory Visit: Payer: Self-pay | Admitting: General Practice

## 2020-05-06 ENCOUNTER — Other Ambulatory Visit: Payer: Self-pay | Admitting: Lactation Services

## 2020-05-06 DIAGNOSIS — O0990 Supervision of high risk pregnancy, unspecified, unspecified trimester: Secondary | ICD-10-CM

## 2020-05-07 ENCOUNTER — Other Ambulatory Visit: Payer: Self-pay

## 2020-05-07 ENCOUNTER — Encounter: Payer: Self-pay | Admitting: Family Medicine

## 2020-05-07 ENCOUNTER — Ambulatory Visit (INDEPENDENT_AMBULATORY_CARE_PROVIDER_SITE_OTHER): Payer: Medicaid Other | Admitting: Family Medicine

## 2020-05-07 ENCOUNTER — Encounter: Payer: Self-pay | Admitting: *Deleted

## 2020-05-07 ENCOUNTER — Other Ambulatory Visit: Payer: Medicaid Other

## 2020-05-07 VITALS — BP 129/64 | HR 72 | Wt 147.2 lb

## 2020-05-07 DIAGNOSIS — Z6791 Unspecified blood type, Rh negative: Secondary | ICD-10-CM

## 2020-05-07 DIAGNOSIS — F17200 Nicotine dependence, unspecified, uncomplicated: Secondary | ICD-10-CM

## 2020-05-07 DIAGNOSIS — Z8759 Personal history of other complications of pregnancy, childbirth and the puerperium: Secondary | ICD-10-CM

## 2020-05-07 DIAGNOSIS — R87619 Unspecified abnormal cytological findings in specimens from cervix uteri: Secondary | ICD-10-CM

## 2020-05-07 DIAGNOSIS — O26899 Other specified pregnancy related conditions, unspecified trimester: Secondary | ICD-10-CM | POA: Diagnosis not present

## 2020-05-07 DIAGNOSIS — F319 Bipolar disorder, unspecified: Secondary | ICD-10-CM

## 2020-05-07 DIAGNOSIS — O0990 Supervision of high risk pregnancy, unspecified, unspecified trimester: Secondary | ICD-10-CM

## 2020-05-07 DIAGNOSIS — Z23 Encounter for immunization: Secondary | ICD-10-CM | POA: Diagnosis not present

## 2020-05-07 DIAGNOSIS — G40909 Epilepsy, unspecified, not intractable, without status epilepticus: Secondary | ICD-10-CM

## 2020-05-07 DIAGNOSIS — F419 Anxiety disorder, unspecified: Secondary | ICD-10-CM

## 2020-05-07 DIAGNOSIS — O3440 Maternal care for other abnormalities of cervix, unspecified trimester: Secondary | ICD-10-CM

## 2020-05-07 MED ORDER — RHO D IMMUNE GLOBULIN 1500 UNIT/2ML IJ SOSY
300.0000 ug | PREFILLED_SYRINGE | Freq: Once | INTRAMUSCULAR | Status: AC
  Administered 2020-05-07: 300 ug via INTRAMUSCULAR

## 2020-05-07 NOTE — Addendum Note (Signed)
Addended by: Henrietta Dine on: 05/07/2020 09:54 AM   Modules accepted: Orders

## 2020-05-07 NOTE — Patient Instructions (Signed)
 Contraception Choices Contraception, also called birth control, refers to methods or devices that prevent pregnancy. Hormonal methods Contraceptive implant A contraceptive implant is a thin, plastic tube that contains a hormone that prevents pregnancy. It is different from an intrauterine device (IUD). It is inserted into the upper part of the arm by a health care provider. Implants can be effective for up to 3 years. Progestin-only injections Progestin-only injections are injections of progestin, a synthetic form of the hormone progesterone. They are given every 3 months by a health care provider. Birth control pills Birth control pills are pills that contain hormones that prevent pregnancy. They must be taken once a day, preferably at the same time each day. A prescription is needed to use this method of contraception. Birth control patch The birth control patch contains hormones that prevent pregnancy. It is placed on the skin and must be changed once a week for three weeks and removed on the fourth week. A prescription is needed to use this method of contraception. Vaginal ring A vaginal ring contains hormones that prevent pregnancy. It is placed in the vagina for three weeks and removed on the fourth week. After that, the process is repeated with a new ring. A prescription is needed to use this method of contraception. Emergency contraceptive Emergency contraceptives prevent pregnancy after unprotected sex. They come in pill form and can be taken up to 5 days after sex. They work best the sooner they are taken after having sex. Most emergency contraceptives are available without a prescription. This method should not be used as your only form of birth control.   Barrier methods Female condom A female condom is a thin sheath that is worn over the penis during sex. Condoms keep sperm from going inside a woman's body. They can be used with a sperm-killing substance (spermicide) to increase their  effectiveness. They should be thrown away after one use. Female condom A female condom is a soft, loose-fitting sheath that is put into the vagina before sex. The condom keeps sperm from going inside a woman's body. They should be thrown away after one use. Diaphragm A diaphragm is a soft, dome-shaped barrier. It is inserted into the vagina before sex, along with a spermicide. The diaphragm blocks sperm from entering the uterus, and the spermicide kills sperm. A diaphragm should be left in the vagina for 6-8 hours after sex and removed within 24 hours. A diaphragm is prescribed and fitted by a health care provider. A diaphragm should be replaced every 1-2 years, after giving birth, after gaining more than 15 lb (6.8 kg), and after pelvic surgery. Cervical cap A cervical cap is a round, soft latex or plastic cup that fits over the cervix. It is inserted into the vagina before sex, along with spermicide. It blocks sperm from entering the uterus. The cap should be left in place for 6-8 hours after sex and removed within 48 hours. A cervical cap must be prescribed and fitted by a health care provider. It should be replaced every 2 years. Sponge A sponge is a soft, circular piece of polyurethane foam with spermicide in it. The sponge helps block sperm from entering the uterus, and the spermicide kills sperm. To use it, you make it wet and then insert it into the vagina. It should be inserted before sex, left in for at least 6 hours after sex, and removed and thrown away within 30 hours. Spermicides Spermicides are chemicals that kill or block sperm from entering the   cervix and uterus. They can come as a cream, jelly, suppository, foam, or tablet. A spermicide should be inserted into the vagina with an applicator at least 10-15 minutes before sex to allow time for it to work. The process must be repeated every time you have sex. Spermicides do not require a prescription.   Intrauterine  contraception Intrauterine device (IUD) An IUD is a T-shaped device that is put in a woman's uterus. There are two types:  Hormone IUD.This type contains progestin, a synthetic form of the hormone progesterone. This type can stay in place for 3-5 years.  Copper IUD.This type is wrapped in copper wire. It can stay in place for 10 years. Permanent methods of contraception Female tubal ligation In this method, a woman's fallopian tubes are sealed, tied, or blocked during surgery to prevent eggs from traveling to the uterus. Hysteroscopic sterilization In this method, a small, flexible insert is placed into each fallopian tube. The inserts cause scar tissue to form in the fallopian tubes and block them, so sperm cannot reach an egg. The procedure takes about 3 months to be effective. Another form of birth control must be used during those 3 months. Female sterilization This is a procedure to tie off the tubes that carry sperm (vasectomy). After the procedure, the man can still ejaculate fluid (semen). Another form of birth control must be used for 3 months after the procedure. Natural planning methods Natural family planning In this method, a couple does not have sex on days when the woman could become pregnant. Calendar method In this method, the woman keeps track of the length of each menstrual cycle, identifies the days when pregnancy can happen, and does not have sex on those days. Ovulation method In this method, a couple avoids sex during ovulation. Symptothermal method This method involves not having sex during ovulation. The woman typically checks for ovulation by watching changes in her temperature and in the consistency of cervical mucus. Post-ovulation method In this method, a couple waits to have sex until after ovulation. Where to find more information  Centers for Disease Control and Prevention: www.cdc.gov Summary  Contraception, also called birth control, refers to methods or  devices that prevent pregnancy.  Hormonal methods of contraception include implants, injections, pills, patches, vaginal rings, and emergency contraceptives.  Barrier methods of contraception can include female condoms, female condoms, diaphragms, cervical caps, sponges, and spermicides.  There are two types of IUDs (intrauterine devices). An IUD can be put in a woman's uterus to prevent pregnancy for 3-5 years.  Permanent sterilization can be done through a procedure for males and females. Natural family planning methods involve nothaving sex on days when the woman could become pregnant. This information is not intended to replace advice given to you by your health care provider. Make sure you discuss any questions you have with your health care provider. Document Revised: 06/26/2019 Document Reviewed: 06/26/2019 Elsevier Patient Education  2021 [employer]   Breastfeeding  Choosing to breastfeed is one of the best decisions you can make for yourself and your baby. A change in hormones during pregnancy causes your breasts to make breast milk in your milk-producing glands. Hormones prevent breast milk from being released before your baby is born. They also prompt milk flow after birth. Once breastfeeding has begun, thoughts of your baby, as well as his or her sucking or crying, can stimulate the release of milk from your milk-producing glands. Benefits of breastfeeding Research shows that breastfeeding offers many health benefits   for infants and mothers. It also offers a cost-free and convenient way to feed your baby. For your baby  Your first milk (colostrum) helps your baby's digestive system to function better.  Special cells in your milk (antibodies) help your baby to fight off infections.  Breastfed babies are less likely to develop asthma, allergies, obesity, or type 2 diabetes. They are also at lower risk for sudden infant death syndrome (SIDS).  Nutrients in breast milk are better  able to meet your baby's needs compared to infant formula.  Breast milk improves your baby's brain development. For you  Breastfeeding helps to create a very special bond between you and your baby.  Breastfeeding is convenient. Breast milk costs nothing and is always available at the correct temperature.  Breastfeeding helps to burn calories. It helps you to lose the weight that you gained during pregnancy.  Breastfeeding makes your uterus return faster to its size before pregnancy. It also slows bleeding (lochia) after you give birth.  Breastfeeding helps to lower your risk of developing type 2 diabetes, osteoporosis, rheumatoid arthritis, cardiovascular disease, and breast, ovarian, uterine, and endometrial cancer later in life. Breastfeeding basics Starting breastfeeding  Find a comfortable place to sit or lie down, with your neck and back well-supported.  Place a pillow or a rolled-up blanket under your baby to bring him or her to the level of your breast (if you are seated). Nursing pillows are specially designed to help support your arms and your baby while you breastfeed.  Make sure that your baby's tummy (abdomen) is facing your abdomen.  Gently massage your breast. With your fingertips, massage from the outer edges of your breast inward toward the nipple. This encourages milk flow. If your milk flows slowly, you may need to continue this action during the feeding.  Support your breast with 4 fingers underneath and your thumb above your nipple (make the letter "C" with your hand). Make sure your fingers are well away from your nipple and your baby's mouth.  Stroke your baby's lips gently with your finger or nipple.  When your baby's mouth is open wide enough, quickly bring your baby to your breast, placing your entire nipple and as much of the areola as possible into your baby's mouth. The areola is the colored area around your nipple. ? More areola should be visible above your  baby's upper lip than below the lower lip. ? Your baby's lips should be opened and extended outward (flanged) to ensure an adequate, comfortable latch. ? Your baby's tongue should be between his or her lower gum and your breast.  Make sure that your baby's mouth is correctly positioned around your nipple (latched). Your baby's lips should create a seal on your breast and be turned out (everted).  It is common for your baby to suck about 2-3 minutes in order to start the flow of breast milk. Latching Teaching your baby how to latch onto your breast properly is very important. An improper latch can cause nipple pain, decreased milk supply, and poor weight gain in your baby. Also, if your baby is not latched onto your nipple properly, he or she may swallow some air during feeding. This can make your baby fussy. Burping your baby when you switch breasts during the feeding can help to get rid of the air. However, teaching your baby to latch on properly is still the best way to prevent fussiness from swallowing air while breastfeeding. Signs that your baby has successfully latched onto   your nipple  Silent tugging or silent sucking, without causing you pain. Infant's lips should be extended outward (flanged).  Swallowing heard between every 3-4 sucks once your milk has started to flow (after your let-down milk reflex occurs).  Muscle movement above and in front of his or her ears while sucking. Signs that your baby has not successfully latched onto your nipple  Sucking sounds or smacking sounds from your baby while breastfeeding.  Nipple pain. If you think your baby has not latched on correctly, slip your finger into the corner of your baby's mouth to break the suction and place it between your baby's gums. Attempt to start breastfeeding again. Signs of successful breastfeeding Signs from your baby  Your baby will gradually decrease the number of sucks or will completely stop sucking.  Your baby  will fall asleep.  Your baby's body will relax.  Your baby will retain a small amount of milk in his or her mouth.  Your baby will let go of your breast by himself or herself. Signs from you  Breasts that have increased in firmness, weight, and size 1-3 hours after feeding.  Breasts that are softer immediately after breastfeeding.  Increased milk volume, as well as a change in milk consistency and color by the fifth day of breastfeeding.  Nipples that are not sore, cracked, or bleeding. Signs that your baby is getting enough milk  Wetting at least 1-2 diapers during the first 24 hours after birth.  Wetting at least 5-6 diapers every 24 hours for the first week after birth. The urine should be clear or pale yellow by the age of 5 days.  Wetting 6-8 diapers every 24 hours as your baby continues to grow and develop.  At least 3 stools in a 24-hour period by the age of 5 days. The stool should be soft and yellow.  At least 3 stools in a 24-hour period by the age of 7 days. The stool should be seedy and yellow.  No loss of weight greater than 10% of birth weight during the first 3 days of life.  Average weight gain of 4-7 oz (113-198 g) per week after the age of 4 days.  Consistent daily weight gain by the age of 5 days, without weight loss after the age of 2 weeks. After a feeding, your baby may spit up a small amount of milk. This is normal. Breastfeeding frequency and duration Frequent feeding will help you make more milk and can prevent sore nipples and extremely full breasts (breast engorgement). Breastfeed when you feel the need to reduce the fullness of your breasts or when your baby shows signs of hunger. This is called "breastfeeding on demand." Signs that your baby is hungry include:  Increased alertness, activity, or restlessness.  Movement of the head from side to side.  Opening of the mouth when the corner of the mouth or cheek is stroked (rooting).  Increased  sucking sounds, smacking lips, cooing, sighing, or squeaking.  Hand-to-mouth movements and sucking on fingers or hands.  Fussing or crying. Avoid introducing a pacifier to your baby in the first 4-6 weeks after your baby is born. After this time, you may choose to use a pacifier. Research has shown that pacifier use during the first year of a baby's life decreases the risk of sudden infant death syndrome (SIDS). Allow your baby to feed on each breast as long as he or she wants. When your baby unlatches or falls asleep while feeding from the   first breast, offer the second breast. Because newborns are often sleepy in the first few weeks of life, you may need to awaken your baby to get him or her to feed. Breastfeeding times will vary from baby to baby. However, the following rules can serve as a guide to help you make sure that your baby is properly fed:  Newborns (babies 4 weeks of age or younger) may breastfeed every 1-3 hours.  Newborns should not go without breastfeeding for longer than 3 hours during the day or 5 hours during the night.  You should breastfeed your baby a minimum of 8 times in a 24-hour period. Breast milk pumping Pumping and storing breast milk allows you to make sure that your baby is exclusively fed your breast milk, even at times when you are unable to breastfeed. This is especially important if you go back to work while you are still breastfeeding, or if you are not able to be present during feedings. Your lactation consultant can help you find a method of pumping that works best for you and give you guidelines about how long it is safe to store breast milk.      Caring for your breasts while you breastfeed Nipples can become dry, cracked, and sore while breastfeeding. The following recommendations can help keep your breasts moisturized and healthy:  Avoid using soap on your nipples.  Wear a supportive bra designed especially for nursing. Avoid wearing underwire-style  bras or extremely tight bras (sports bras).  Air-dry your nipples for 3-4 minutes after each feeding.  Use only cotton bra pads to absorb leaked breast milk. Leaking of breast milk between feedings is normal.  Use lanolin on your nipples after breastfeeding. Lanolin helps to maintain your skin's normal moisture barrier. Pure lanolin is not harmful (not toxic) to your baby. You may also hand express a few drops of breast milk and gently massage that milk into your nipples and allow the milk to air-dry. In the first few weeks after giving birth, some women experience breast engorgement. Engorgement can make your breasts feel heavy, warm, and tender to the touch. Engorgement peaks within 3-5 days after you give birth. The following recommendations can help to ease engorgement:  Completely empty your breasts while breastfeeding or pumping. You may want to start by applying warm, moist heat (in the shower or with warm, water-soaked hand towels) just before feeding or pumping. This increases circulation and helps the milk flow. If your baby does not completely empty your breasts while breastfeeding, pump any extra milk after he or she is finished.  Apply ice packs to your breasts immediately after breastfeeding or pumping, unless this is too uncomfortable for you. To do this: ? Put ice in a plastic bag. ? Place a towel between your skin and the bag. ? Leave the ice on for 20 minutes, 2-3 times a day.  Make sure that your baby is latched on and positioned properly while breastfeeding. If engorgement persists after 48 hours of following these recommendations, contact your health care provider or a lactation consultant. Overall health care recommendations while breastfeeding  Eat 3 healthy meals and 3 snacks every day. Well-nourished mothers who are breastfeeding need an additional 450-500 calories a day. You can meet this requirement by increasing the amount of a balanced diet that you eat.  Drink  enough water to keep your urine pale yellow or clear.  Rest often, relax, and continue to take your prenatal vitamins to prevent fatigue, stress, and low   vitamin and mineral levels in your body (nutrient deficiencies).  Do not use any products that contain nicotine or tobacco, such as cigarettes and e-cigarettes. Your baby may be harmed by chemicals from cigarettes that pass into breast milk and exposure to secondhand smoke. If you need help quitting, ask your health care provider.  Avoid alcohol.  Do not use illegal drugs or marijuana.  Talk with your health care provider before taking any medicines. These include over-the-counter and prescription medicines as well as vitamins and herbal supplements. Some medicines that may be harmful to your baby can pass through breast milk.  It is possible to become pregnant while breastfeeding. If birth control is desired, ask your health care provider about options that will be safe while breastfeeding your baby. Where to find more information: [employer]: www.llli.org Contact a health care provider if:  You feel like you want to stop breastfeeding or have become frustrated with breastfeeding.  Your nipples are cracked or bleeding.  Your breasts are red, tender, or warm.  You have: ? Painful breasts or nipples. ? A swollen area on either breast. ? A fever or chills. ? Nausea or vomiting. ? Drainage other than breast milk from your nipples.  Your breasts do not become full before feedings by the fifth day after you give birth.  You feel sad and depressed.  Your baby is: ? Too sleepy to eat well. ? Having trouble sleeping. ? More than 1 week old and wetting fewer than 6 diapers in a 24-hour period. ? Not gaining weight by 5 days of age.  Your baby has fewer than 3 stools in a 24-hour period.  Your baby's skin or the [place] parts of his or her eyes become yellow. Get help right away if:  Your baby is overly tired  (lethargic) and does not want to wake up and feed.  Your baby develops an unexplained fever. Summary  Breastfeeding offers many health benefits for infant and mothers.  Try to breastfeed your infant when he or she shows early signs of hunger.  Gently tickle or stroke your baby's lips with your finger or nipple to allow the baby to open his or her mouth. Bring the baby to your breast. Make sure that much of the areola is in your baby's mouth. Offer one side and burp the baby before you offer the other side.  Talk with your health care provider or lactation consultant if you have questions or you face problems as you breastfeed. This information is not intended to replace advice given to you by your health care provider. Make sure you discuss any questions you have with your health care provider. Document Revised: 04/15/2017 Document Reviewed: 02/21/2016 Elsevier Patient Education  2021 [employer] Inc.  

## 2020-05-07 NOTE — Progress Notes (Signed)
Pt states for the last 4 days has been having really bad headaches, feels unbalanced & lightheaded.

## 2020-05-07 NOTE — Progress Notes (Signed)
BTL signed 05/07/20   

## 2020-05-07 NOTE — Progress Notes (Signed)
   Subjective:  Renee Velez is a 25 y.o. [id number] at [redacted]w[redacted]d being seen today for ongoing prenatal care.  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  Patient reports no complaints.  Contractions: Irritability. Vag. Bleeding: None.  Movement: Present. Denies leaking of fluid.   The following portions of the patient's history were reviewed and updated as appropriate: allergies, current medications, past family history, past medical history, past social history, past surgical history and problem list. Problem list updated.  Objective:   Vitals:   05/07/20 0838  BP: 129/64  Pulse: 72  Weight: 147 lb 3.2 oz (66.8 kg)    Fetal Status: Fetal Heart Rate (bpm): 134   Movement: Present     General:  Alert, oriented and cooperative. Patient is in no acute distress.  Skin: Skin is warm and dry. No rash noted.   Cardiovascular: Normal heart rate noted  Respiratory: Normal respiratory effort, no problems with respiration noted  Abdomen: Soft, gravid, appropriate for gestational age. Pain/Pressure: Present     Pelvic: Vag. Bleeding: None     Cervical exam deferred        Extremities: Normal range of motion.  Edema: None  Mental Status: Normal mood and affect. Normal behavior. Normal judgment and thought content.   Urinalysis:      Assessment and Plan:  Pregnancy: [id number] at [redacted]w[redacted]d  1. Supervision of high risk pregnancy, antepartum BP and FHR normal 28 wk labs and TDaP today Patient desires permanent sterilization.  Other reversible forms of  contraception were discussed with patient; she declines all other modalities. Risks of procedure discussed with patient including but not limited to: risk of regret, permanence of method, bleeding, infection, injury to surrounding organs and need for additional procedures.  Failure risk of about 1% with increased risk of ectopic gestation if pregnancy occurs was also discussed with patient.  Also discussed possibility of post-tubal pain syndrome. The patient concurred with the proposed plan, giving informed written consent for the procedures.  2. Seizure disorder (HCC) No seizures for the past 6 months Not taking Keppra Was going to [employer] but kicked out of practice for missing one appt Referred to [facility]  3. Rh negative, antepartum Rhogam today  4. History of gestational hypertension BP's normal this pregnancy  5. Anxiety Mood stable  6. Antepartum abnormal cervical Papanicolaou smear complicating pregnancy LSIL pap earlier in pregnancy Defer colpo until after delivery  7. Bipolar 1 disorder (HCC) Reports given diagnosis postpartum Questionable Not on any meds currently  8. Tobacco use disorder Cut down to 3 cig/day, previously 1 ppd Hoping to quit completely after delivery  Preterm labor symptoms and general obstetric precautions including but not limited to vaginal bleeding, contractions, leaking of fluid and fetal movement were reviewed in detail with the patient. Please refer to After Visit Summary for other counseling recommendations.  Return in 2 weeks (on 05/21/2020) for [facility], ob visit, needs MD.   Venora Maples, MD

## 2020-05-08 LAB — CBC
Hematocrit: 32.4 % — ABNORMAL LOW (ref 34.0–46.6)
Hemoglobin: 10.7 g/dL — ABNORMAL LOW (ref 11.1–15.9)
MCH: 29.9 pg (ref 26.6–33.0)
MCHC: 33 g/dL (ref 31.5–35.7)
MCV: 91 fL (ref 79–97)
Platelets: 248 10*3/uL (ref 150–450)
RBC: 3.58 x10E6/uL — ABNORMAL LOW (ref 3.77–5.28)
RDW: 12.6 % (ref 11.7–15.4)
WBC: 11.6 10*3/uL — ABNORMAL HIGH (ref 3.4–10.8)

## 2020-05-08 LAB — HIV ANTIBODY (ROUTINE TESTING W REFLEX): HIV Screen 4th Generation wRfx: NONREACTIVE

## 2020-05-08 LAB — GLUCOSE TOLERANCE, 2 HOURS W/ 1HR
Glucose, 1 hour: 117 mg/dL (ref 65–179)
Glucose, 2 hour: 109 mg/dL (ref 65–152)
Glucose, Fasting: 77 mg/dL (ref 65–91)

## 2020-05-08 LAB — ANTIBODY SCREEN: Antibody Screen: NEGATIVE

## 2020-05-08 LAB — RPR: RPR Ser Ql: NONREACTIVE

## 2020-05-14 ENCOUNTER — Ambulatory Visit: Payer: Medicaid Other | Admitting: *Deleted

## 2020-05-14 ENCOUNTER — Ambulatory Visit: Payer: Medicaid Other | Attending: Obstetrics

## 2020-05-14 ENCOUNTER — Encounter: Payer: Self-pay | Admitting: *Deleted

## 2020-05-14 ENCOUNTER — Other Ambulatory Visit: Payer: Self-pay

## 2020-05-14 ENCOUNTER — Other Ambulatory Visit: Payer: Self-pay | Admitting: *Deleted

## 2020-05-14 DIAGNOSIS — O3440 Maternal care for other abnormalities of cervix, unspecified trimester: Secondary | ICD-10-CM | POA: Diagnosis present

## 2020-05-14 DIAGNOSIS — O365990 Maternal care for other known or suspected poor fetal growth, unspecified trimester, not applicable or unspecified: Secondary | ICD-10-CM | POA: Diagnosis present

## 2020-05-14 DIAGNOSIS — G40909 Epilepsy, unspecified, not intractable, without status epilepticus: Secondary | ICD-10-CM | POA: Diagnosis not present

## 2020-05-14 DIAGNOSIS — R87619 Unspecified abnormal cytological findings in specimens from cervix uteri: Secondary | ICD-10-CM | POA: Diagnosis present

## 2020-05-14 DIAGNOSIS — O09293 Supervision of pregnancy with other poor reproductive or obstetric history, third trimester: Secondary | ICD-10-CM

## 2020-05-14 DIAGNOSIS — Z362 Encounter for other antenatal screening follow-up: Secondary | ICD-10-CM

## 2020-05-14 DIAGNOSIS — O09893 Supervision of other high risk pregnancies, third trimester: Secondary | ICD-10-CM | POA: Diagnosis not present

## 2020-05-14 DIAGNOSIS — O0990 Supervision of high risk pregnancy, unspecified, unspecified trimester: Secondary | ICD-10-CM

## 2020-05-14 DIAGNOSIS — O365930 Maternal care for other known or suspected poor fetal growth, third trimester, not applicable or unspecified: Secondary | ICD-10-CM

## 2020-05-14 DIAGNOSIS — Z141 Cystic fibrosis carrier: Secondary | ICD-10-CM

## 2020-05-14 DIAGNOSIS — O99353 Diseases of the nervous system complicating pregnancy, third trimester: Secondary | ICD-10-CM | POA: Diagnosis not present

## 2020-05-14 DIAGNOSIS — Z3A33 33 weeks gestation of pregnancy: Secondary | ICD-10-CM

## 2020-05-14 DIAGNOSIS — O360130 Maternal care for anti-D [Rh] antibodies, third trimester, not applicable or unspecified: Secondary | ICD-10-CM

## 2020-05-20 ENCOUNTER — Encounter: Payer: Medicaid Other | Admitting: Family Medicine

## 2020-06-03 ENCOUNTER — Other Ambulatory Visit (HOSPITAL_COMMUNITY)
Admission: RE | Admit: 2020-06-03 | Discharge: 2020-06-03 | Disposition: A | Discharge status: Home / Self Care | Payer: Medicaid Other | Source: Ambulatory Visit | Attending: Obstetrics and Gynecology | Admitting: Obstetrics and Gynecology

## 2020-06-03 ENCOUNTER — Ambulatory Visit (INDEPENDENT_AMBULATORY_CARE_PROVIDER_SITE_OTHER): Payer: Medicaid Other | Admitting: Obstetrics and Gynecology

## 2020-06-03 ENCOUNTER — Other Ambulatory Visit: Payer: Self-pay

## 2020-06-03 VITALS — BP 117/75 | HR 74 | Wt 152.2 lb

## 2020-06-03 DIAGNOSIS — Z3A36 36 weeks gestation of pregnancy: Secondary | ICD-10-CM | POA: Insufficient documentation

## 2020-06-03 DIAGNOSIS — R87619 Unspecified abnormal cytological findings in specimens from cervix uteri: Secondary | ICD-10-CM

## 2020-06-03 DIAGNOSIS — O0990 Supervision of high risk pregnancy, unspecified, unspecified trimester: Secondary | ICD-10-CM | POA: Insufficient documentation

## 2020-06-03 DIAGNOSIS — O26899 Other specified pregnancy related conditions, unspecified trimester: Secondary | ICD-10-CM

## 2020-06-03 DIAGNOSIS — Z6791 Unspecified blood type, Rh negative: Secondary | ICD-10-CM

## 2020-06-03 DIAGNOSIS — G40909 Epilepsy, unspecified, not intractable, without status epilepticus: Secondary | ICD-10-CM

## 2020-06-03 DIAGNOSIS — Z8759 Personal history of other complications of pregnancy, childbirth and the puerperium: Secondary | ICD-10-CM

## 2020-06-03 DIAGNOSIS — Z87440 Personal history of urinary (tract) infections: Secondary | ICD-10-CM

## 2020-06-03 DIAGNOSIS — Z141 Cystic fibrosis carrier: Secondary | ICD-10-CM

## 2020-06-03 DIAGNOSIS — O3440 Maternal care for other abnormalities of cervix, unspecified trimester: Secondary | ICD-10-CM

## 2020-06-03 DIAGNOSIS — O365990 Maternal care for other known or suspected poor fetal growth, unspecified trimester, not applicable or unspecified: Secondary | ICD-10-CM

## 2020-06-03 NOTE — Progress Notes (Signed)
   PRENATAL VISIT NOTE  Subjective:  Renee Velez is a 25 y.o. [id number] at [redacted]w[redacted]d being seen today for ongoing prenatal care.  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  Patient doing well with no acute concerns today. She reports no complaints.  Contractions: Irritability. Vag. Bleeding: None.  Movement: Present. Denies leaking of fluid.   The following portions of the patient's history were reviewed and updated as appropriate: allergies, current medications, past family history, past medical history, past social history, past surgical history and problem list. Problem list updated.  Objective:   Vitals:   06/03/20 1006  BP: 117/75  Pulse: 74  Weight: 152 lb 3.2 oz (69 kg)    Fetal Status: Fetal Heart Rate (bpm): 133 Fundal Height: 36 cm Movement: Present     General:  Alert, oriented and cooperative. Patient is in no acute distress.  Skin: Skin is warm and dry. No rash noted.   Cardiovascular: Normal heart rate noted  Respiratory: Normal respiratory effort, no problems with respiration noted  Abdomen: Soft, gravid, appropriate for gestational age.  Pain/Pressure: Absent     Pelvic: Cervical exam performed Dilation: Fingertip Effacement (%): 40 Station: -3  Extremities: Normal range of motion.  Edema: None  Mental Status:  Normal mood and affect. Normal behavior. Normal judgment and thought content.   Assessment and Plan:  Pregnancy: [id number] at [redacted]w[redacted]d  1. Supervision of high risk pregnancy,  antepartum Continue routine care - GC/Chlamydia probe amp ([place])not at [facility] - Culture, beta strep (group b only)  2. Rh negative, antepartum Treat after delivery  3. Pregnancy affected by fetal growth restriction Last EFW at 12% , pt has growth on 5/11  4. History of pyelonephritis during pregnancy   5. History of gestational hypertension BP WNL today  6. Antepartum abnormal cervical Papanicolaou smear complicating pregnancy LGSIL from previous, repap after delivery  7. 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 Seizure disorder (HCC) Pt has not been taking her keppra.  Pt strongly advised to restart her antiseizure medication  9. [redacted] weeks gestation of pregnancy   Preterm labor symptoms and general obstetric precautions including but not limited to vaginal bleeding, contractions, leaking of fluid and fetal movement were reviewed in detail with the patient.  Please refer to After Visit Summary for other counseling recommendations.   Return in about 1 week (around 06/10/2020) for [facility], in person.   Mariel Aloe, MD Faculty Attending Center for [facility]

## 2020-06-03 NOTE — Patient Instructions (Signed)

## 2020-06-04 LAB — GC/CHLAMYDIA PROBE AMP (~~LOC~~) NOT AT ARMC
Chlamydia: NEGATIVE
Comment: NEGATIVE
Comment: NORMAL
Neisseria Gonorrhea: NEGATIVE

## 2020-06-07 LAB — CULTURE, BETA STREP (GROUP B ONLY): Strep Gp B Culture: NEGATIVE

## 2020-06-10 ENCOUNTER — Ambulatory Visit (INDEPENDENT_AMBULATORY_CARE_PROVIDER_SITE_OTHER): Payer: Medicaid Other | Admitting: Obstetrics and Gynecology

## 2020-06-10 ENCOUNTER — Other Ambulatory Visit: Payer: Self-pay

## 2020-06-10 VITALS — BP 123/76 | HR 82 | Wt 152.9 lb

## 2020-06-10 DIAGNOSIS — O3440 Maternal care for other abnormalities of cervix, unspecified trimester: Secondary | ICD-10-CM

## 2020-06-10 DIAGNOSIS — G40909 Epilepsy, unspecified, not intractable, without status epilepticus: Secondary | ICD-10-CM

## 2020-06-10 DIAGNOSIS — Z6791 Unspecified blood type, Rh negative: Secondary | ICD-10-CM

## 2020-06-10 DIAGNOSIS — O26899 Other specified pregnancy related conditions, unspecified trimester: Secondary | ICD-10-CM

## 2020-06-10 DIAGNOSIS — Z141 Cystic fibrosis carrier: Secondary | ICD-10-CM

## 2020-06-10 DIAGNOSIS — F319 Bipolar disorder, unspecified: Secondary | ICD-10-CM

## 2020-06-10 DIAGNOSIS — F419 Anxiety disorder, unspecified: Secondary | ICD-10-CM

## 2020-06-10 DIAGNOSIS — O365990 Maternal care for other known or suspected poor fetal growth, unspecified trimester, not applicable or unspecified: Secondary | ICD-10-CM

## 2020-06-10 DIAGNOSIS — Z3A37 37 weeks gestation of pregnancy: Secondary | ICD-10-CM | POA: Insufficient documentation

## 2020-06-10 DIAGNOSIS — O0990 Supervision of high risk pregnancy, unspecified, unspecified trimester: Secondary | ICD-10-CM

## 2020-06-10 DIAGNOSIS — R87619 Unspecified abnormal cytological findings in specimens from cervix uteri: Secondary | ICD-10-CM

## 2020-06-10 NOTE — Progress Notes (Signed)
   PRENATAL VISIT NOTE  Subjective:  Renee Velez is a 25 y.o. [id number] at [redacted]w[redacted]d being seen today for ongoing prenatal care.  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  Patient doing well with no acute concerns today. She reports no complaints.  Contractions: Irritability. Vag. Bleeding: None.  Movement: Present. Denies leaking of fluid.   The following portions of the patient's history were reviewed and updated as appropriate: allergies, current medications, past family history, past medical history, past social history, past surgical history and problem list. Problem list updated.  Objective:   Vitals:   06/10/20 0947  BP: 123/76  Pulse: 82  Weight: 152 lb 14.4 oz (69.4 kg)    Fetal Status: Fetal Heart Rate (bpm): 142 Fundal Height: 37 cm Movement: Present     General:  Alert, oriented and cooperative. Patient is in no acute distress.  Skin: Skin is warm and dry. No rash noted.   Cardiovascular: Normal heart rate noted  Respiratory: Normal respiratory effort, no problems with respiration noted  Abdomen: Soft, gravid, appropriate for gestational age.  Pain/Pressure: Present     Pelvic: Cervical exam deferred        Extremities: Normal range of motion.  Edema: None  Mental Status:  Normal mood and affect. Normal behavior. Normal judgment and thought content.   Assessment and Plan:  Pregnancy: [id number] at [redacted]w[redacted]d  1. Seizure disorder (HCC)   2. Suspected carrier of  cystic fibrosis   3. Supervision of high risk pregnancy, antepartum Continue routine care  4. Rh negative, antepartum Treat after delivery  5. Pregnancy affected by fetal growth restriction Pt has repeat growth on 5/11  6. Bipolar 1 disorder (HCC)   7. Anxiety   8. Antepartum abnormal cervical Papanicolaou smear complicating pregnancy Pap/colpo after delivery  9. [redacted] weeks gestation of pregnancy   Term labor symptoms and general obstetric precautions including but not limited to vaginal bleeding, contractions, leaking of fluid and fetal movement were reviewed in detail with the patient.  Please refer to After Visit Summary for other counseling recommendations.   Return in about 1 week (around 06/17/2020) for [facility], in person.   Mariel Aloe, MD Faculty Attending Center for [facility]

## 2020-06-12 ENCOUNTER — Ambulatory Visit: Payer: Medicaid Other

## 2020-06-17 ENCOUNTER — Telehealth: Payer: Self-pay | Admitting: General Practice

## 2020-06-17 ENCOUNTER — Ambulatory Visit: Payer: Medicaid Other | Attending: Maternal & Fetal Medicine | Admitting: *Deleted

## 2020-06-17 ENCOUNTER — Telehealth (HOSPITAL_COMMUNITY): Payer: Self-pay | Admitting: *Deleted

## 2020-06-17 ENCOUNTER — Ambulatory Visit (HOSPITAL_BASED_OUTPATIENT_CLINIC_OR_DEPARTMENT_OTHER): Payer: Medicaid Other

## 2020-06-17 ENCOUNTER — Other Ambulatory Visit: Payer: Self-pay

## 2020-06-17 ENCOUNTER — Encounter: Payer: Self-pay | Admitting: *Deleted

## 2020-06-17 ENCOUNTER — Encounter: Payer: Self-pay | Admitting: General Practice

## 2020-06-17 ENCOUNTER — Encounter (HOSPITAL_COMMUNITY): Payer: Self-pay | Admitting: *Deleted

## 2020-06-17 ENCOUNTER — Ambulatory Visit (INDEPENDENT_AMBULATORY_CARE_PROVIDER_SITE_OTHER): Payer: Medicaid Other | Admitting: Obstetrics & Gynecology

## 2020-06-17 ENCOUNTER — Other Ambulatory Visit: Payer: Self-pay | Admitting: Obstetrics & Gynecology

## 2020-06-17 DIAGNOSIS — O09893 Supervision of other high risk pregnancies, third trimester: Secondary | ICD-10-CM | POA: Diagnosis not present

## 2020-06-17 DIAGNOSIS — Z3A38 38 weeks gestation of pregnancy: Secondary | ICD-10-CM | POA: Diagnosis not present

## 2020-06-17 DIAGNOSIS — O0990 Supervision of high risk pregnancy, unspecified, unspecified trimester: Secondary | ICD-10-CM | POA: Diagnosis not present

## 2020-06-17 DIAGNOSIS — O99283 Endocrine, nutritional and metabolic diseases complicating pregnancy, third trimester: Secondary | ICD-10-CM | POA: Diagnosis not present

## 2020-06-17 DIAGNOSIS — O09293 Supervision of pregnancy with other poor reproductive or obstetric history, third trimester: Secondary | ICD-10-CM | POA: Insufficient documentation

## 2020-06-17 DIAGNOSIS — Z79899 Other long term (current) drug therapy: Secondary | ICD-10-CM | POA: Diagnosis not present

## 2020-06-17 DIAGNOSIS — O99353 Diseases of the nervous system complicating pregnancy, third trimester: Secondary | ICD-10-CM

## 2020-06-17 DIAGNOSIS — O3440 Maternal care for other abnormalities of cervix, unspecified trimester: Secondary | ICD-10-CM

## 2020-06-17 DIAGNOSIS — R87619 Unspecified abnormal cytological findings in specimens from cervix uteri: Secondary | ICD-10-CM

## 2020-06-17 DIAGNOSIS — G40909 Epilepsy, unspecified, not intractable, without status epilepticus: Secondary | ICD-10-CM | POA: Diagnosis not present

## 2020-06-17 DIAGNOSIS — Z8759 Personal history of other complications of pregnancy, childbirth and the puerperium: Secondary | ICD-10-CM

## 2020-06-17 DIAGNOSIS — O360130 Maternal care for anti-D [Rh] antibodies, third trimester, not applicable or unspecified: Secondary | ICD-10-CM

## 2020-06-17 NOTE — Telephone Encounter (Signed)
Called patient and reviewed IOL information/scheduled for 5/22. Patient verbalized understanding.

## 2020-06-17 NOTE — Telephone Encounter (Signed)
Preadmission screen  

## 2020-06-17 NOTE — Progress Notes (Signed)
   PRENATAL VISIT NOTE  Subjective:  Renee Velez is a 25 y.o. [id number] at [redacted]w[redacted]d being seen today for ongoing prenatal care.  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  Patient reports occasional contractions.  Contractions: Irregular. Vag. Bleeding: None.  Movement: Present. Denies leaking of fluid.   The following portions of the patient's history were reviewed and updated as appropriate: allergies, current medications, past family history, past medical history, past social history, past surgical history and problem list.   Objective:   Vitals:   06/17/20 0934  BP: 119/77  Pulse: 74  Weight: 155 lb 8 oz (70.5 kg)    Fetal Status: Fetal Heart Rate (bpm): 145   Movement: Present  Presentation: Vertex  General:  Alert, oriented and cooperative. Patient is in no acute distress.  Skin: Skin is warm and dry. No rash noted.   Cardiovascular: Normal heart rate noted  Respiratory: Normal respiratory effort, no problems with respiration noted  Abdomen: Soft, gravid, appropriate for gestational age.  Pain/Pressure: Present     Pelvic: Cervical exam performed in the presence of a chaperone Dilation: 2 Effacement (%): 50 Station: -3  Extremities: Normal range of motion.  Edema: None  Mental Status: Normal mood and affect. Normal behavior. Normal judgment and thought content.   Assessment and Plan:  Pregnancy: [id number] at [redacted]w[redacted]d 1. Supervision of high risk pregnancy, antepartum On Keppra for seizure d/o, MFM approves IOL 39 weeks, will schedule this in 6 days  Term labor symptoms and  general obstetric precautions including but not limited to vaginal bleeding, contractions, leaking of fluid and fetal movement were reviewed in detail with the patient. Please refer to After Visit Summary for other counseling recommendations.   Return if symptoms worsen or fail to improve, for postpartum.  No future appointments.  Scheryl Darter, MD

## 2020-06-17 NOTE — Patient Instructions (Signed)
Labor Induction Labor induction is when steps are taken to cause a pregnant woman to begin the labor process. Most women go into labor on their own between 37 weeks and 42 weeks of pregnancy. When this does not happen, or when there is a medical need for labor to begin, steps may be taken to induce, or bring on, labor. Labor induction causes a pregnant woman's uterus to contract. It also causes the cervix to soften (ripen), open (dilate), and thin out. Usually, labor is not induced before 39 weeks of pregnancy unless there is a medical reason to do so. When is labor induction considered? Labor induction may be right for you if:  Your pregnancy lasts longer than 41 to 42 weeks.  Your placenta is separating from your uterus (placental abruption).  You have a rupture of membranes and your labor does not begin.  You have health problems, like diabetes or high blood pressure (preeclampsia) during your pregnancy.  Your baby has stopped growing or does not have enough amniotic fluid. Before labor induction begins, your health care provider will consider the following factors:  Your medical condition and the baby's condition.  How many weeks you have been pregnant.  How mature the baby's lungs are.  The condition of your cervix.  The position of the baby.  The size of your birth canal. Tell a health care provider about:  Any allergies you have.  All medicines you are taking, including vitamins, herbs, eye drops, creams, and over-the-counter medicines.  Any problems you or your family members have had with anesthetic medicines.  Any surgeries you have had.  Any blood disorders you have.  Any medical conditions you have. What are the risks? Generally, this is a safe procedure. However, problems may occur, including:  Failed induction.  Changes in fetal heart rate, such as being too high, too low, or irregular (erratic).  Infection in the mother or the baby.  Increased risk of  having a cesarean delivery.  Breaking off (abruption) of the placenta from the uterus. This is rare.  Rupture of the uterus. This is very rare.  Your baby could fail to get enough blood flow or oxygen. This can be life-threatening. When induction is needed for medical reasons, the benefits generally outweigh the risks. What happens during the procedure? During the procedure, your health care provider will use one of these methods to induce labor:  Stripping the membranes. In this method, the amniotic sac tissue is gently separated from the cervix. This causes the following to happen: ? Your cervix stretches, which in turn causes the release of prostaglandins. ? Prostaglandins induce labor and cause the uterus to contract. ? This procedure is often done in an office visit. You will be sent home to wait for contractions to begin.  Prostaglandin medicine. This medicine starts contractions and causes the cervix to dilate and ripen. This can be taken by mouth (orally) or by being inserted into the vagina (suppository).  Inserting a small, thin tube (catheter) with a balloon into the vagina and then expanding the balloon with water to dilate the cervix.  Breaking the water. In this method, a small instrument is used to make a small hole in the amniotic sac. This eventually causes the amniotic sac to break. Contractions should begin within a few hours.  Medicine to trigger or strengthen contractions. This medicine is given through an IV that is inserted into a vein in your arm. This procedure may vary among health care providers and hospitals.     Where to find more information  [employer]: www.marchofdimes.org  The American College of Obstetricians and Gynecologists: www.acog.org Summary  Labor induction causes a pregnant woman's uterus to contract. It also causes the cervix to soften (ripen), open (dilate), and thin out.  Labor is usually not induced before 39 weeks of pregnancy unless  there is a medical reason to do so.  When induction is needed for medical reasons, the benefits generally outweigh the risks.  Talk with your health care provider about which methods of labor induction are right for you. This information is not intended to replace advice given to you by your health care provider. Make sure you discuss any questions you have with your health care provider. Document Revised: 11/02/2019 Document Reviewed: 11/02/2019 Elsevier Patient Education  2021 Elsevier Inc.  

## 2020-06-19 ENCOUNTER — Other Ambulatory Visit: Payer: Self-pay | Admitting: Advanced Practice Midwife

## 2020-06-21 ENCOUNTER — Other Ambulatory Visit (HOSPITAL_COMMUNITY)
Admission: RE | Admit: 2020-06-21 | Discharge: 2020-06-21 | Disposition: A | Discharge status: Home / Self Care | Payer: Medicaid Other | Source: Ambulatory Visit | Attending: Obstetrics & Gynecology | Admitting: Obstetrics & Gynecology

## 2020-06-21 DIAGNOSIS — Z20822 Contact with and (suspected) exposure to covid-19: Secondary | ICD-10-CM | POA: Diagnosis not present

## 2020-06-21 DIAGNOSIS — Z01812 Encounter for preprocedural laboratory examination: Secondary | ICD-10-CM | POA: Diagnosis not present

## 2020-06-21 LAB — SARS CORONAVIRUS 2 (TAT 6-24 HRS): SARS Coronavirus 2: NEGATIVE

## 2020-06-23 ENCOUNTER — Inpatient Hospital Stay (HOSPITAL_COMMUNITY)
Admission: AD | Admit: 2020-06-23 | Discharge: 2020-06-25 | DRG: 798 | Disposition: A | Discharge status: Home / Self Care | Payer: Medicaid Other | Attending: Obstetrics and Gynecology | Admitting: Obstetrics and Gynecology

## 2020-06-23 ENCOUNTER — Inpatient Hospital Stay (HOSPITAL_COMMUNITY): Payer: Medicaid Other

## 2020-06-23 ENCOUNTER — Inpatient Hospital Stay (HOSPITAL_COMMUNITY): Payer: Medicaid Other | Admitting: Anesthesiology

## 2020-06-23 ENCOUNTER — Encounter (HOSPITAL_COMMUNITY): Payer: Self-pay | Admitting: Obstetrics & Gynecology

## 2020-06-23 ENCOUNTER — Other Ambulatory Visit: Payer: Self-pay

## 2020-06-23 DIAGNOSIS — Z9079 Acquired absence of other genital organ(s): Secondary | ICD-10-CM

## 2020-06-23 DIAGNOSIS — G40909 Epilepsy, unspecified, not intractable, without status epilepticus: Secondary | ICD-10-CM | POA: Diagnosis present

## 2020-06-23 DIAGNOSIS — O0990 Supervision of high risk pregnancy, unspecified, unspecified trimester: Secondary | ICD-10-CM

## 2020-06-23 DIAGNOSIS — F17210 Nicotine dependence, cigarettes, uncomplicated: Secondary | ICD-10-CM | POA: Diagnosis present

## 2020-06-23 DIAGNOSIS — Z3A39 39 weeks gestation of pregnancy: Secondary | ICD-10-CM

## 2020-06-23 DIAGNOSIS — O26899 Other specified pregnancy related conditions, unspecified trimester: Secondary | ICD-10-CM

## 2020-06-23 DIAGNOSIS — F419 Anxiety disorder, unspecified: Secondary | ICD-10-CM | POA: Diagnosis present

## 2020-06-23 DIAGNOSIS — O360130 Maternal care for anti-D [Rh] antibodies, third trimester, not applicable or unspecified: Secondary | ICD-10-CM

## 2020-06-23 DIAGNOSIS — O3440 Maternal care for other abnormalities of cervix, unspecified trimester: Principal | ICD-10-CM

## 2020-06-23 DIAGNOSIS — R87619 Unspecified abnormal cytological findings in specimens from cervix uteri: Secondary | ICD-10-CM

## 2020-06-23 DIAGNOSIS — O26893 Other specified pregnancy related conditions, third trimester: Secondary | ICD-10-CM | POA: Diagnosis present

## 2020-06-23 DIAGNOSIS — O99334 Smoking (tobacco) complicating childbirth: Secondary | ICD-10-CM | POA: Diagnosis present

## 2020-06-23 DIAGNOSIS — Z302 Encounter for sterilization: Secondary | ICD-10-CM | POA: Diagnosis not present

## 2020-06-23 DIAGNOSIS — Z6791 Unspecified blood type, Rh negative: Secondary | ICD-10-CM | POA: Diagnosis not present

## 2020-06-23 DIAGNOSIS — O99354 Diseases of the nervous system complicating childbirth: Secondary | ICD-10-CM | POA: Diagnosis present

## 2020-06-23 DIAGNOSIS — Z141 Cystic fibrosis carrier: Secondary | ICD-10-CM

## 2020-06-23 DIAGNOSIS — F17200 Nicotine dependence, unspecified, uncomplicated: Secondary | ICD-10-CM | POA: Diagnosis present

## 2020-06-23 DIAGNOSIS — Z8759 Personal history of other complications of pregnancy, childbirth and the puerperium: Secondary | ICD-10-CM

## 2020-06-23 HISTORY — DX: Unspecified abnormal cytological findings in specimens from vagina: R87.629

## 2020-06-23 LAB — CBC
HCT: 31.2 % — ABNORMAL LOW (ref 36.0–46.0)
Hemoglobin: 10.3 g/dL — ABNORMAL LOW (ref 12.0–15.0)
MCH: 29.3 pg (ref 26.0–34.0)
MCHC: 33 g/dL (ref 30.0–36.0)
MCV: 88.9 fL (ref 80.0–100.0)
Platelets: 254 10*3/uL (ref 150–400)
RBC: 3.51 MIL/uL — ABNORMAL LOW (ref 3.87–5.11)
RDW: 13.9 % (ref 11.5–15.5)
WBC: 12.9 10*3/uL — ABNORMAL HIGH (ref 4.0–10.5)
nRBC: 0 % (ref 0.0–0.2)

## 2020-06-23 MED ORDER — MISOPROSTOL 50MCG HALF TABLET
50.0000 ug | ORAL_TABLET | ORAL | Status: DC | PRN
  Administered 2020-06-23: 50 ug via BUCCAL

## 2020-06-23 MED ORDER — LACTATED RINGERS AMNIOINFUSION: INTRAVENOUS | Status: DC

## 2020-06-23 MED ORDER — TERBUTALINE SULFATE 1 MG/ML IJ SOLN: 0.2500 mg | Freq: Once | INTRAMUSCULAR | Status: DC | PRN

## 2020-06-23 MED ORDER — PHENYLEPHRINE 40 MCG/ML (10ML) SYRINGE FOR IV PUSH (FOR BLOOD PRESSURE SUPPORT): 80.0000 ug | PREFILLED_SYRINGE | INTRAVENOUS | Status: DC | PRN

## 2020-06-23 MED ORDER — LACTATED RINGERS IV SOLN: 500.0000 mL | INTRAVENOUS | Status: DC | PRN

## 2020-06-23 MED ORDER — FENTANYL CITRATE (PF) 100 MCG/2ML IJ SOLN
100.0000 ug | INTRAMUSCULAR | Status: DC | PRN
  Administered 2020-06-23: 100 ug via INTRAVENOUS

## 2020-06-23 MED ORDER — OXYTOCIN-SODIUM CHLORIDE 30-0.9 UT/500ML-% IV SOLN
2.5000 [IU]/h | INTRAVENOUS | Status: DC
  Administered 2020-06-24: 2.5 [IU]/h via INTRAVENOUS
  Filled 2020-06-23 (×2): qty 500

## 2020-06-23 MED ORDER — PROMETHAZINE HCL 12.5 MG PO TABS
12.5000 mg | ORAL_TABLET | Freq: Once | ORAL | Status: AC
  Administered 2020-06-23: 12.5 mg via ORAL
  Filled 2020-06-23: qty 1

## 2020-06-23 MED ORDER — FENTANYL CITRATE (PF) 100 MCG/2ML IJ SOLN
INTRAMUSCULAR | Status: AC
  Filled 2020-06-23: qty 2

## 2020-06-23 MED ORDER — LACTATED RINGERS IV SOLN: INTRAVENOUS | Status: DC

## 2020-06-23 MED ORDER — ACETAMINOPHEN 325 MG PO TABS: 650.0000 mg | ORAL_TABLET | ORAL | Status: DC | PRN

## 2020-06-23 MED ORDER — MISOPROSTOL 50MCG HALF TABLET
ORAL_TABLET | ORAL | Status: AC
  Filled 2020-06-23: qty 1

## 2020-06-23 MED ORDER — OXYCODONE-ACETAMINOPHEN 5-325 MG PO TABS: 2.0000 | ORAL_TABLET | ORAL | Status: DC | PRN

## 2020-06-23 MED ORDER — DIPHENHYDRAMINE HCL 50 MG/ML IJ SOLN: 12.5000 mg | INTRAMUSCULAR | Status: DC | PRN

## 2020-06-23 MED ORDER — LIDOCAINE HCL (PF) 1 % IJ SOLN: 30.0000 mL | INTRAMUSCULAR | Status: DC | PRN

## 2020-06-23 MED ORDER — LEVETIRACETAM 500 MG PO TABS
1000.0000 mg | ORAL_TABLET | Freq: Two times a day (BID) | ORAL | Status: DC
  Administered 2020-06-23 – 2020-06-25 (×4): 1000 mg via ORAL
  Filled 2020-06-23 (×6): qty 2

## 2020-06-23 MED ORDER — TRANEXAMIC ACID-NACL 1000-0.7 MG/100ML-% IV SOLN
INTRAVENOUS | Status: AC
  Administered 2020-06-24: 1000 mg
  Filled 2020-06-23: qty 100

## 2020-06-23 MED ORDER — LACTATED RINGERS IV SOLN
500.0000 mL | Freq: Once | INTRAVENOUS | Status: AC
  Administered 2020-06-23: 500 mL via INTRAVENOUS

## 2020-06-23 MED ORDER — LIDOCAINE HCL (PF) 1 % IJ SOLN
INTRAMUSCULAR | Status: DC | PRN
  Administered 2020-06-23 (×2): 4 mL via EPIDURAL

## 2020-06-23 MED ORDER — EPHEDRINE 5 MG/ML INJ: 10.0000 mg | INTRAVENOUS | Status: DC | PRN

## 2020-06-23 MED ORDER — OXYTOCIN BOLUS FROM INFUSION
333.0000 mL | Freq: Once | INTRAVENOUS | Status: AC
  Administered 2020-06-24: 333 mL via INTRAVENOUS

## 2020-06-23 MED ORDER — FENTANYL-BUPIVACAINE-NACL 0.5-0.125-0.9 MG/250ML-% EP SOLN
12.0000 mL/h | EPIDURAL | Status: DC | PRN
  Administered 2020-06-23: 12 mL/h via EPIDURAL
  Filled 2020-06-23: qty 250

## 2020-06-23 MED ORDER — ONDANSETRON HCL 4 MG/2ML IJ SOLN
4.0000 mg | Freq: Four times a day (QID) | INTRAMUSCULAR | Status: DC | PRN
  Administered 2020-06-23: 4 mg via INTRAVENOUS
  Filled 2020-06-23: qty 2

## 2020-06-23 MED ORDER — OXYCODONE-ACETAMINOPHEN 5-325 MG PO TABS
1.0000 | ORAL_TABLET | ORAL | Status: DC | PRN
Start: 2020-06-23 — End: 2020-06-24

## 2020-06-23 MED ORDER — SOD CITRATE-CITRIC ACID 500-334 MG/5ML PO SOLN: 30.0000 mL | ORAL | Status: DC | PRN

## 2020-06-23 NOTE — Progress Notes (Incomplete)
Labor Progress Note Renee Velez is a 25 y.o. [id number] at [redacted]w[redacted]d presented for elective IOL.  S: Pt in hands and knees given deep, recurrent variable decels. No other concerns at this time.  O:  BP 116/73   Pulse (!) 52   Temp 97.6 F (36.4 C) (Oral)   Resp 17   Ht 5\' 4"  (1.626 m)   Wt 71.7 kg   LMP 09/22/2019   BMI 27.14 kg/m  EFM: baseline 110/moderate variability/+accels, no decels Toco: contractions every 2-3 min  CVE: Dilation: 7 Effacement (%): 70 Station: 0 Presentation: Vertex Exam by:: [email or web address], RN   A&P: 25 y.o. 22 [redacted]w[redacted]d presented for elective IOL. #Labor: S/p cytotec x1. Now AROM for large volume clear fluid at 2105. #Pain: epidural in place #FWB: Category 1 strip #GBS negative #Seizure Disorder: pt reports h/o grand mal seizures x2 in current pregnancy. Intermittent adherence to keppra. Continue keppra 1000mg  BID on admission. Seizure precautions in place. #Depression: no current medications. Plan for SW consult in postpartum period. #Contraception: desire for postpartum BTL. Consent form signed 05/07/20.  , MD 11:48 PM

## 2020-06-23 NOTE — Progress Notes (Signed)
Labor Progress Note Renee Velez is a 25 y.o. [id number] at [redacted]w[redacted]d presented for elective IOL.  S: Pt resting comfortable with epidural in place. No concerns at this time. Partner at bedside.  O:  BP 105/61   Pulse (!) 59   Temp 97.6 F (36.4 C) (Oral)   Resp 17   Ht 5\' 4"  (1.626 m)   Wt 71.7 kg   LMP 09/22/2019   BMI 27.14 kg/m  EFM: baseline 110/moderate variability/+accels, no decels Toco: contractions every 2-3 min  CVE: Dilation: 2 Effacement (%): 60 Station: -2 Presentation: Vertex Exam by:: Dr.   A&P: 24 y.o. [email or web address] [redacted]w[redacted]d presented for elective IOL. #Labor: S/p cytotec x1. Now AROM for large volume clear fluid at 2105. #Pain: epidural in place #FWB: Category 1 strip #GBS negative #Seizure Disorder: pt reports h/o grand mal seizures x2 in current pregnancy. Intermittent adherence to keppra. Continue keppra 1000mg  BID on admission. Seizure precautions in place. #Depression: no current medications. Plan for SW consult in postpartum period. #Contraception: desire for postpartum BTL. Consent form signed 05/07/20.  , MD 9:11 PM

## 2020-06-23 NOTE — H&P (Signed)
OBSTETRIC ADMISSION HISTORY AND PHYSICAL  Renee Velez is a 25 y.o. female [phone] with IUP at 49w0dby 12wk UKoreapresenting for elective IOL . She reports +FMs, No LOF, no VB, no blurry vision, headaches or peripheral edema, and RUQ pain.  She plans on breast feeding. She requests BTL for birth control. [facility]  Dating: By 12 wk [place]--->  Estimated Date of Delivery: 06/30/20  Sono:    '@[redacted]w[redacted]d' , CWD, normal anatomy, cephalic presentation, 27517G 20% EFW   Prenatal History/Complications:  -history of seizure disorder, intermittently on keppra (has taken recently, last dose this AM)  -history of depression and history of postpartum depression, not on meds -FGR: initially diagnosed w FGR but resolved in subsequent ultrasounds  -Rh Neg -tobacco use disorder -able to quit by time of delivery  -history of gHTN in prior pregnancy    Past Medical History: Past Medical History:  Diagnosis Date  . Anxiety   . Chlamydia   . [place]ilepsy ([facility]   . Gonorrhea   . Post partum depression 2018  . Seizure ([facility]     Past Surgical History: Past Surgical History:  Procedure Laterality Date  . NO PAST SURGERIES      Obstetrical History: OB History    Gravida  6   Para  4   Term  4   Preterm      AB  1   Living  4     SAB  1   IAB  0   Ectopic  0   Multiple  0   Live Births  4           Social History Social History   Socioeconomic History  . Marital status: Single    Spouse name: Not on file  . Number of children: Not on file  . Years of education: Not on file  . Highest education level: Not on file  Occupational History    Comment: unemployed  Tobacco Use  . Smoking status: Current Every Day Smoker    Packs/day: 0.25    Types: Cigarettes  . Smokeless tobacco: Never Used  Vaping Use  . Vaping Use: Never used  Substance and Sexual Activity  . Alcohol use: Not Currently  . Drug use: No  . Sexual activity: Yes  Other Topics Concern   . Not on file  Social History Narrative  . Not on file   Social Determinants of Health   Financial Resource Strain: Not on file  Food Insecurity: No Food Insecurity  . Worried About RCharity fundraiserin the Last Year: Never true  . Ran Out of Food in the Last Year: Never true  Transportation Needs: No Transportation Needs  . Lack of Transportation (Medical): No  . Lack of Transportation (Non-Medical): No  Physical Activity: Not on file  Stress: Not on file  Social Connections: Not on file    Family History: Family History  Problem Relation Age of Onset  . Hypertension Father   . Cirrhosis Father   . Cancer Maternal Grandmother   . Cancer Paternal Grandmother   . Diabetes Paternal Grandfather   . Thyroid disease Sister   . ADD / ADHD Brother     Allergies: Allergies  Allergen Reactions  . Nickel Rash    Medications Prior to Admission  Medication Sig Dispense Refill Last Dose  . aspirin 81 MG chewable tablet Chew by mouth daily.     . Blood Pressure Monitoring (BLOOD  PRESSURE KIT) DEVI 1 Device by Does not apply route as needed. 1 each 0   . levETIRAcetam (KEPPRA) 1000 MG tablet Take 1 tablet (1,000 mg total) by mouth 2 (two) times daily. 60 tablet 5   . levETIRAcetam (KEPPRA) 1000 MG tablet Take 1,000 mg by mouth 2 (two) times daily. (Patient not taking: Reported on 06/17/2020)     . Prenatal Vit-Fe Fumarate-FA (PRENATAL MULTIVITAMIN) TABS tablet Take 1 tablet by mouth daily at 12 noon.        Review of Systems   All systems reviewed and negative except as stated in HPI  Last menstrual period 09/22/2019, not currently breastfeeding. General appearance: alert, cooperative and appears stated age Lungs: effort normal  Abdomen: soft, non-tender; bowel sounds normal Extremities: Homans sign is negative, no sign of DVT Presentation: cephalic Fetal monitoringbaseline 125/mod variability/pos accels/ no decels Uterine activityNone     Prenatal labs: ABO, Rh:  A/Negative/-- (12/06 1009) Antibody: Negative (04/05 0810) Rubella: 1.17 (12/06 1009) RPR: Non Reactive (04/05 0810)  HBsAg: Negative (12/06 1009)  HIV: Non Reactive (04/05 0810)  GBS: Negative/-- (05/02 1044)  2 hr Glucola normal Genetic screening  LR NIPS, female Anatomy US normal   Prenatal Transfer Tool  Maternal Diabetes: No Genetic Screening: Normal Maternal Ultrasounds/Referrals: IUGR  > subsequently resolved Fetal Ultrasounds or other Referrals:  Referred to Materal Fetal Medicine  Maternal Substance Abuse:  Yes:  Type: Smoker Significant Maternal Medications:  Meds include: Other:   Keppra Significant Maternal Lab Results: Group B Strep negative  No results found for this or any previous visit (from the past 24 hour(s)).  Patient Active Problem List   Diagnosis Date Noted  . Seizure disorder during pregnancy ([place]) 06/23/2020  . 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 Antepartum abnormal cervical Papanicolaou smear complicating pregnancy 74/73/4037  . Supervision of high risk pregnancy, antepartum 01/08/2020  . Anxiety 12/12/2018  . History of pyelonephritis during pregnancy 11/17/2018  . Rh negative, antepartum 07/13/2018  . History of gestational hypertension 04/13/2016  . Seizure disorder ([place]) 09/10/2015  . Bipolar 1 disorder ([place]) 09/10/2015  . Tobacco use disorder 09/10/2015    Assessment/Plan:  Renee Velez is a 25 y.o. [id number] at 13w0dhere for elective induction of labor   #Labor: start with cytotec, recheck in four hours  #Pain: Per patient request  #FWB: Cat I  #ID:  gbs neg  #MOF: breast #MOC:BTL #Circ:  Yes   #Seizure Disorder: Not consistently taking keppra during pregnancy, reports two seizures during her pregnancy. Has been taking it BID daily for past two weeks. WILl continue her home keppra dose 100107mBID  #Bipolar disorder: not on meds, SW consult postpartum #RH negative: rhogam eval postpartum  JuJanet BerlinMD  06/23/2020,  12:15 PM

## 2020-06-23 NOTE — Anesthesia Procedure Notes (Signed)
Epidural Patient location during procedure: OB Start time: 06/23/2020 8:07 PM End time: 06/23/2020 8:11 PM  Staffing Anesthesiologist: Kaylyn Layer, MD Performed: anesthesiologist   Preanesthetic Checklist Completed: patient identified, IV checked, risks and benefits discussed, monitors and equipment checked, pre-op evaluation and timeout performed  Epidural Patient position: sitting Prep: DuraPrep and site prepped and draped Patient monitoring: continuous pulse ox, blood pressure and heart rate Approach: midline Location: L3-L4 Injection technique: LOR air  Needle:  Needle type: Tuohy  Needle gauge: 17 G Needle length: 9 cm Catheter type: closed end flexible Catheter size: 19 Gauge Catheter at skin depth: 9 cm Test dose: negative and Other (1% lidocaine)  Assessment Events: blood not aspirated, injection not painful, no injection resistance, no paresthesia and negative IV test  Additional Notes Patient identified. Risks, benefits, and alternatives discussed with patient including but not limited to bleeding, infection, nerve damage, paralysis, failed block, incomplete pain control, headache, blood pressure changes, nausea, vomiting, reactions to medication, itching, and postpartum back pain. Confirmed with bedside nurse the patient's most recent platelet count. Confirmed with patient that they are not currently taking any anticoagulation, have any bleeding history, or any family history of bleeding disorders. Patient expressed understanding and wished to proceed. All questions were answered. Sterile technique was used throughout the entire procedure. Please see nursing notes for vital signs.   Crisp LOR on first pass. Test dose was given through epidural catheter and negative prior to continuing to dose epidural or start infusion. Warning signs of high block given to the patient including shortness of breath, tingling/numbness in hands, complete motor block, or any concerning  symptoms with instructions to call for help. Patient was given instructions on fall risk and not to get out of bed. All questions and concerns addressed with instructions to call with any issues or inadequate analgesia.  Reason for block:procedure for pain

## 2020-06-23 NOTE — Anesthesia Preprocedure Evaluation (Signed)
Anesthesia Evaluation  Patient identified by MRN, date of birth, ID band Patient awake    Reviewed: Allergy & Precautions, Patient's Chart, lab work & pertinent test results  History of Anesthesia Complications Negative for: history of anesthetic complications  Airway Mallampati: II  TM Distance: >3 FB Neck ROM: Full    Dental no notable dental hx.    Pulmonary Current Smoker,    Pulmonary exam normal        Cardiovascular negative cardio ROS Normal cardiovascular exam     Neuro/Psych Seizures -,  Anxiety Depression Bipolar Disorder    GI/Hepatic negative GI ROS, Neg liver ROS,   Endo/Other  negative endocrine ROS  Renal/GU negative Renal ROS  negative genitourinary   Musculoskeletal negative musculoskeletal ROS (+)   Abdominal   Peds  Hematology negative hematology ROS (+)   Anesthesia Other Findings Day of surgery medications reviewed with patient.  Reproductive/Obstetrics (+) Pregnancy                             Anesthesia Physical Anesthesia Plan  ASA: II  Anesthesia Plan: Epidural   Post-op Pain Management:    Induction:   PONV Risk Score and Plan: Treatment may vary due to age or medical condition  Airway Management Planned: Natural Airway  Additional Equipment:   Intra-op Plan:   Post-operative Plan:   Informed Consent: I have reviewed the patients History and Physical, chart, labs and discussed the procedure including the risks, benefits and alternatives for the proposed anesthesia with the patient or authorized representative who has indicated his/her understanding and acceptance.       Plan Discussed with:   Anesthesia Plan Comments:         Anesthesia Quick Evaluation

## 2020-06-24 ENCOUNTER — Encounter (HOSPITAL_COMMUNITY)
Admission: AD | Disposition: A | Discharge status: Home / Self Care | Payer: Self-pay | Source: Home / Self Care | Attending: Obstetrics and Gynecology

## 2020-06-24 ENCOUNTER — Inpatient Hospital Stay (HOSPITAL_COMMUNITY): Payer: Medicaid Other | Admitting: Anesthesiology

## 2020-06-24 ENCOUNTER — Encounter (HOSPITAL_COMMUNITY): Payer: Self-pay | Admitting: Family Medicine

## 2020-06-24 DIAGNOSIS — Z9079 Acquired absence of other genital organ(s): Secondary | ICD-10-CM

## 2020-06-24 DIAGNOSIS — Z302 Encounter for sterilization: Secondary | ICD-10-CM

## 2020-06-24 HISTORY — PX: TUBAL LIGATION: SHX77

## 2020-06-24 LAB — RPR: RPR Ser Ql: NONREACTIVE

## 2020-06-24 LAB — TYPE AND SCREEN
ABO/RH(D): A NEG
Antibody Screen: POSITIVE

## 2020-06-24 SURGERY — LIGATION, FALLOPIAN TUBE, POSTPARTUM
Anesthesia: Epidural

## 2020-06-24 MED ORDER — ACETAMINOPHEN 325 MG PO TABS
650.0000 mg | ORAL_TABLET | Freq: Four times a day (QID) | ORAL | Status: DC
  Administered 2020-06-24 – 2020-06-25 (×3): 650 mg via ORAL
  Filled 2020-06-24 (×5): qty 2

## 2020-06-24 MED ORDER — ONDANSETRON HCL 4 MG/2ML IJ SOLN: 4.0000 mg | Freq: Once | INTRAMUSCULAR | Status: DC | PRN

## 2020-06-24 MED ORDER — WITCH HAZEL-GLYCERIN EX PADS: 1.0000 "application " | MEDICATED_PAD | CUTANEOUS | Status: DC | PRN

## 2020-06-24 MED ORDER — OXYCODONE HCL 5 MG/5ML PO SOLN: 5.0000 mg | Freq: Once | ORAL | Status: DC | PRN

## 2020-06-24 MED ORDER — DIPHENHYDRAMINE HCL 25 MG PO CAPS: 25.0000 mg | ORAL_CAPSULE | Freq: Four times a day (QID) | ORAL | Status: DC | PRN

## 2020-06-24 MED ORDER — FENTANYL CITRATE (PF) 100 MCG/2ML IJ SOLN
INTRAMUSCULAR | Status: AC
  Filled 2020-06-24: qty 2

## 2020-06-24 MED ORDER — LACTATED RINGERS IV SOLN: INTRAVENOUS | Status: DC

## 2020-06-24 MED ORDER — ACETAMINOPHEN 325 MG PO TABS: 325.0000 mg | ORAL_TABLET | ORAL | Status: DC | PRN

## 2020-06-24 MED ORDER — BENZOCAINE-MENTHOL 20-0.5 % EX AERO
1.0000 | INHALATION_SPRAY | CUTANEOUS | Status: DC | PRN
Start: 2020-06-24 — End: 2020-06-25

## 2020-06-24 MED ORDER — ACETAMINOPHEN 160 MG/5ML PO SOLN
325.0000 mg | ORAL | Status: DC | PRN
Start: 2020-06-24 — End: 2020-06-24

## 2020-06-24 MED ORDER — OXYCODONE HCL 5 MG PO TABS
5.0000 mg | ORAL_TABLET | ORAL | Status: DC | PRN
  Administered 2020-06-24 – 2020-06-25 (×4): 5 mg via ORAL
  Filled 2020-06-24 (×4): qty 1

## 2020-06-24 MED ORDER — IBUPROFEN 600 MG PO TABS
600.0000 mg | ORAL_TABLET | Freq: Four times a day (QID) | ORAL | Status: DC
  Administered 2020-06-24 – 2020-06-25 (×4): 600 mg via ORAL
  Filled 2020-06-24 (×4): qty 1

## 2020-06-24 MED ORDER — TRANEXAMIC ACID-NACL 1000-0.7 MG/100ML-% IV SOLN: 1000.0000 mg | INTRAVENOUS | Status: AC

## 2020-06-24 MED ORDER — DEXMEDETOMIDINE (PRECEDEX) IN NS 20 MCG/5ML (4 MCG/ML) IV SYRINGE
PREFILLED_SYRINGE | INTRAVENOUS | Status: DC | PRN
  Administered 2020-06-24: 20 ug via INTRAVENOUS

## 2020-06-24 MED ORDER — TETANUS-DIPHTH-ACELL PERTUSSIS 5-2.5-18.5 LF-MCG/0.5 IM SUSY: 0.5000 mL | PREFILLED_SYRINGE | Freq: Once | INTRAMUSCULAR | Status: DC

## 2020-06-24 MED ORDER — MIDAZOLAM HCL 2 MG/2ML IJ SOLN
INTRAMUSCULAR | Status: AC
  Filled 2020-06-24: qty 2

## 2020-06-24 MED ORDER — RHO D IMMUNE GLOBULIN 1500 UNIT/2ML IJ SOSY
300.0000 ug | PREFILLED_SYRINGE | Freq: Once | INTRAMUSCULAR | Status: AC
  Administered 2020-06-24: 300 ug via INTRAVENOUS
  Filled 2020-06-24: qty 2

## 2020-06-24 MED ORDER — STERILE WATER FOR IRRIGATION IR SOLN
Status: DC | PRN
  Administered 2020-06-24: 1000 mL

## 2020-06-24 MED ORDER — FAMOTIDINE 20 MG PO TABS
40.0000 mg | ORAL_TABLET | Freq: Once | ORAL | Status: AC
  Administered 2020-06-24: 40 mg via ORAL
  Filled 2020-06-24: qty 2

## 2020-06-24 MED ORDER — LIDOCAINE-EPINEPHRINE (PF) 2 %-1:200000 IJ SOLN
INTRAMUSCULAR | Status: DC | PRN
  Administered 2020-06-24 (×4): 5 mg via INTRADERMAL

## 2020-06-24 MED ORDER — SIMETHICONE 80 MG PO CHEW: 80.0000 mg | CHEWABLE_TABLET | ORAL | Status: DC | PRN

## 2020-06-24 MED ORDER — ACETAMINOPHEN 10 MG/ML IV SOLN
INTRAVENOUS | Status: AC
  Filled 2020-06-24: qty 100

## 2020-06-24 MED ORDER — KETOROLAC TROMETHAMINE 30 MG/ML IJ SOLN
INTRAMUSCULAR | Status: DC | PRN
  Administered 2020-06-24: 30 mg via INTRAVENOUS

## 2020-06-24 MED ORDER — METOCLOPRAMIDE HCL 10 MG PO TABS
10.0000 mg | ORAL_TABLET | Freq: Once | ORAL | Status: AC
  Administered 2020-06-24: 10 mg via ORAL
  Filled 2020-06-24: qty 1

## 2020-06-24 MED ORDER — COCONUT OIL OIL
1.0000 | TOPICAL_OIL | Status: DC | PRN
Start: 2020-06-24 — End: 2020-06-25

## 2020-06-24 MED ORDER — OXYCODONE HCL 5 MG PO TABS: 5.0000 mg | ORAL_TABLET | Freq: Once | ORAL | Status: DC | PRN

## 2020-06-24 MED ORDER — ONDANSETRON HCL 4 MG PO TABS: 4.0000 mg | ORAL_TABLET | ORAL | Status: DC | PRN

## 2020-06-24 MED ORDER — MIDAZOLAM HCL 5 MG/5ML IJ SOLN
INTRAMUSCULAR | Status: DC | PRN
  Administered 2020-06-24: 2 mg via INTRAVENOUS

## 2020-06-24 MED ORDER — HYDROMORPHONE HCL 1 MG/ML IJ SOLN: 0.5000 mg | INTRAMUSCULAR | Status: DC | PRN

## 2020-06-24 MED ORDER — FENTANYL CITRATE (PF) 100 MCG/2ML IJ SOLN
25.0000 ug | INTRAMUSCULAR | Status: DC | PRN
  Administered 2020-06-24 (×2): 25 ug via INTRAVENOUS
  Administered 2020-06-24 (×2): 50 ug via INTRAVENOUS
  Administered 2020-06-24: 100 ug via INTRAVENOUS

## 2020-06-24 MED ORDER — ACETAMINOPHEN 10 MG/ML IV SOLN
1000.0000 mg | Freq: Once | INTRAVENOUS | Status: AC
  Administered 2020-06-24: 1000 mg via INTRAVENOUS

## 2020-06-24 MED ORDER — ONDANSETRON HCL 4 MG/2ML IJ SOLN: 4.0000 mg | INTRAMUSCULAR | Status: DC | PRN

## 2020-06-24 MED ORDER — PRENATAL MULTIVITAMIN CH
1.0000 | ORAL_TABLET | Freq: Every day | ORAL | Status: DC
  Administered 2020-06-24 – 2020-06-25 (×2): 1 via ORAL
  Filled 2020-06-24 (×2): qty 1

## 2020-06-24 MED ORDER — SENNOSIDES-DOCUSATE SODIUM 8.6-50 MG PO TABS
2.0000 | ORAL_TABLET | Freq: Every day | ORAL | Status: DC
  Administered 2020-06-25: 2 via ORAL
  Filled 2020-06-24: qty 2

## 2020-06-24 MED ORDER — FERROUS SULFATE 325 (65 FE) MG PO TABS
325.0000 mg | ORAL_TABLET | ORAL | Status: DC
  Administered 2020-06-24: 325 mg via ORAL
  Filled 2020-06-24: qty 1

## 2020-06-24 MED ORDER — DIBUCAINE (PERIANAL) 1 % EX OINT: 1.0000 "application " | TOPICAL_OINTMENT | CUTANEOUS | Status: DC | PRN

## 2020-06-24 MED ORDER — SODIUM CHLORIDE 0.9 % IV SOLN
12.5000 mg | Freq: Once | INTRAVENOUS | Status: AC
  Administered 2020-06-24: 12.5 mg via INTRAVENOUS
  Filled 2020-06-24: qty 0.5

## 2020-06-24 MED ORDER — MEPERIDINE HCL 25 MG/ML IJ SOLN: 6.2500 mg | INTRAMUSCULAR | Status: DC | PRN

## 2020-06-24 SURGICAL SUPPLY — 23 items
DRSG OPSITE POSTOP 3X4 (GAUZE/BANDAGES/DRESSINGS) ×4 IMPLANT
DURAPREP 26ML APPLICATOR (WOUND CARE) ×2 IMPLANT
GLOVE BIOGEL PI IND STRL 6.5 (GLOVE) ×1 IMPLANT
GLOVE BIOGEL PI IND STRL 7.0 (GLOVE) ×1 IMPLANT
GLOVE BIOGEL PI INDICATOR 6.5 (GLOVE) ×1
GLOVE BIOGEL PI INDICATOR 7.0 (GLOVE) ×1
GLOVE SURG SS PI 6.0 STRL IVOR (GLOVE) ×2 IMPLANT
GOWN STRL REUS W/TWL LRG LVL3 (GOWN DISPOSABLE) ×4 IMPLANT
NEEDLE HYPO 22GX1.5 SAFETY (NEEDLE) IMPLANT
NS IRRIG 1000ML POUR BTL (IV SOLUTION) ×2 IMPLANT
PACK ABDOMINAL MINOR (CUSTOM PROCEDURE TRAY) ×2 IMPLANT
PROTECTOR NERVE ULNAR (MISCELLANEOUS) ×2 IMPLANT
SPONGE GAUZE 2X2 8PLY STRL LF (GAUZE/BANDAGES/DRESSINGS) ×2 IMPLANT
SPONGE LAP 4X18 RFD (DISPOSABLE) IMPLANT
SUT MON AB 2-0 CT1 36 (SUTURE) ×4 IMPLANT
SUT PLAIN 0 NONE (SUTURE) ×2 IMPLANT
SUT VIC AB 0 CT1 27 (SUTURE) ×1
SUT VIC AB 0 CT1 27XBRD ANBCTR (SUTURE) ×1 IMPLANT
SUT VIC AB 3-0 PS2 18 (SUTURE) ×2 IMPLANT
SYR CONTROL 10ML LL (SYRINGE) IMPLANT
TOWEL OR 17X24 6PK STRL BLUE (TOWEL DISPOSABLE) ×4 IMPLANT
TRAY FOLEY CATH SILVER 14FR (SET/KITS/TRAYS/PACK) ×2 IMPLANT
WATER STERILE IRR 1000ML POUR (IV SOLUTION) ×2 IMPLANT

## 2020-06-24 NOTE — Anesthesia Postprocedure Evaluation (Signed)
Anesthesia Post Note  Patient: Renee Velez  Procedure(s) Performed: POST PARTUM TUBAL LIGATION (N/A )     Patient location during evaluation: Mother Baby Anesthesia Type: Epidural Level of consciousness: awake and alert and oriented Pain management: satisfactory to patient Vital Signs Assessment: post-procedure vital signs reviewed and stable Respiratory status: spontaneous breathing and nonlabored ventilation Cardiovascular status: stable Postop Assessment: no headache, no backache, no signs of nausea or vomiting, adequate PO intake, patient able to bend at knees and able to ambulate (patient up walking) Anesthetic complications: no   No complications documented.  Last Vitals:  Vitals:   06/24/20 1351 06/24/20 1544  BP: 102/61 116/80  Pulse: (!) 52 (!) 48  Resp: 17 (!) 22  Temp: 36.4 C 36.6 C  SpO2: 100% 100%    Last Pain:  Vitals:   06/24/20 1544  TempSrc: Oral  PainSc: 6    Pain Goal: Patients Stated Pain Goal: 3 (06/24/20 1330)              Epidural/Spinal Function Cutaneous sensation: Normal sensation (06/24/20 1544), Patient able to flex knees: Yes (06/24/20 1544), Patient able to lift hips off bed: Yes (06/24/20 1544), Back pain beyond tenderness at insertion site: No (06/24/20 1544), Progressively worsening motor and/or sensory loss: No (06/24/20 1544), Bowel and/or bladder incontinence post epidural: No (06/24/20 1544)  Elonna Mcfarlane

## 2020-06-24 NOTE — Discharge Instructions (Signed)

## 2020-06-24 NOTE — Anesthesia Postprocedure Evaluation (Signed)
Anesthesia Post Note  Patient: Kately Wilmer  Procedure(s) Performed: AN AD HOC LABOR EPIDURAL     Patient location during evaluation: Mother Baby Anesthesia Type: Epidural Level of consciousness: awake and alert and oriented Pain management: satisfactory to patient Vital Signs Assessment: post-procedure vital signs reviewed and stable Respiratory status: spontaneous breathing and nonlabored ventilation Cardiovascular status: stable Postop Assessment: no headache, no backache, no signs of nausea or vomiting, adequate PO intake, patient able to bend at knees and able to ambulate (patient up walking) Anesthetic complications: no   No complications documented.  Last Vitals:  Vitals:   06/24/20 1342 06/24/20 1351  BP: 95/63 102/61  Pulse: 61 (!) 52  Resp: 16 17  Temp: 36.8 C 36.4 C  SpO2:  100%    Last Pain:  Vitals:   06/24/20 1351  TempSrc: Axillary  PainSc: 0-No pain   Pain Goal: Patients Stated Pain Goal: 3 (06/24/20 1330)                 Madison Hickman

## 2020-06-24 NOTE — Clinical Social Work Maternal (Addendum)
CLINICAL SOCIAL WORK MATERNAL/CHILD NOTE  Patient Details  Name: Renee Velez MRN: [id number] Date of Birth: 06/06/1995  Date:  06/24/2020  Clinical Social Worker Initiating Note:  Kodee Drury, LCSW Date/Time: Initiated:  06/24/20/1457     Child's Name:    Renee Velez  Biological Parents:  Mother,Father   Need for Interpreter:  None   Reason for Referral:  Behavioral Health Concerns   Address:  [address] [place] [address]    Phone number:  [phone] (home)     Additional phone number:   Household Members/Support Persons (HM/SP):   Household Member/Support Person 1,Household Member/Support Person 2,Household Member/Support Person 3,Household Member/Support Person 4,Household Member/Support Person 5   HM/SP Name Relationship DOB or Age  HM/SP -1 TJ Velez Significant Other 01-09-1988  HM/SP -2 Renee Velez Child 5  HM/SP -3 Renee Velez Child 3  HM/SP -4 Renee Velez Child 2  HM/SP -5 Renee Velez Child 1  HM/SP -6        HM/SP -7        HM/SP -8          Natural Supports (not living in the home):      Professional Supports:     Employment: Homemaker   Type of Work:     Education:  High school graduate   Homebound arranged:    Financial Resources:  Medicaid   Other Resources:  WIC,Food Stamps    Cultural/Religious Considerations Which May Impact Care:    Strengths:  Ability to meet basic needs   Psychotropic Medications:         Pediatrician:       Pediatrician List:   [place]    [place]    [place]    [place]    [place]    [place]      Pediatrician Fax Number:    Risk Factors/Current Problems:  Mental Health Concerns    Cognitive State:  Able to Concentrate ,Alert ,Goal Oriented    Mood/Affect:  Calm ,Bright ,Happy    CSW Assessment:CSW received consult for hx of Bipolar, Anxiety and Depression.  CSW met with MOB to offer support and complete assessment.    CSW met with  MOB at bedside. CSW observed MOB woke to voice and the infant in bassinet sleep. CSW congratulated MOB. CSW introduced role and reason for the visit. MOB presented calm, bright and receptive to CSW. CSW confirmed MOB demographics information correct. CSW asked about MOB household. MOB reports she lives with FOB and her children (see above). CSW inquired how MOB feels since giving birth. MOB reports feeling tired but feels this pregnancy with easier than her others.   CSW inquired about MOB history of Bipolar and depression and postpartum. MOB reports the Bipolar diagnosis is incorrect. MOB reports she was diagnosed with bipolar two days after giving birth. MOB reports her hormones were adjusting and she should not have been diagnosed with Bipolar. MOB acknowledges that she does have a history of depression and anxiety. MOB reports she was diagnosed in 2013. MOB reports at the time she was prescribed medication Lorazepam for anxiety however she could not recall the medication prescribed for depression symptoms. MOB reports about three years ago she was in therapy for about a year prior to moving from [place] to [place]. CSW inquired if MOB experienced postpartum after the birth of her children. MOB reports she experienced postpartum depression with all her children except the oldest. MOB reports she cried a lot and   felt hopeless. MOB reports she has always notified her physician about her postpartum symptoms. MOB reports they offered medication however she has always declined. CSW asked MOB about her coping skills. MOB reports she focuses on taking care of her children. CSW praised MOB for her efforts. CSW provided education regarding the baby blues period vs. perinatal mood disorders, discussed treatment and gave resources for mental health follow up. CSW recommended MOB complete a self-evaluation during the postpartum time period using the New Mom Checklist from Postpartum Progress and encouraged MOB to contact a  medical professional if symptoms are noted. MOB receptive to the resources provided. CSW assessed MOB for safety. MOB denies thoughts of harm to self and others. CSW inquired about MOB supports. MOB reports FOB is her support.    MOB very knowledgeable of SIDS. CSW provided review of Sudden Infant Death Syndrome (SIDS) precautions. MOB reports the infant will sleep in a bassinet. CSW inquired if MOB has essential items for the infant. MOB reports she has essential items for the infant including a car seat. CSW inquired if MOB receives WIC/FS. MOB reports she receives WIC and Food stamp services. MOB will notify them she has given birth. MOB has chosen [facility]. CSW inquired if MOB has transportation needs. MOB reports no transportation needs.   CSW identifies no further need for intervention and no barriers to discharge at this time.   CSW Plan/Description:  No Further Intervention Required/No Barriers to Discharge,Perinatal Mood and Anxiety Disorder (PMADs) Education,Sudden Infant Death Syndrome (SIDS) Education    Emit Kuenzel A Lucyann Romano, LCSW 06/24/2020, 3:05 PM   

## 2020-06-24 NOTE — Op Note (Signed)
Postpartum Tubal Ligation Operative Note   Patient: Renee Velez  Date of Procedure: 06/24/2020  Procedure: Postpartum bilateral Tubal Ligation via Bilateral salpingectomy   Indications: undesired fertility  Pre-operative Diagnosis: PP BTL.   Post-operative Diagnosis: Same  Surgeon: Surgeon(s) and Role:    * Venora Maples, MD - Primary  Assistants: none  Anesthesia: epidural  Anesthesiologist: Dr. Tacy Dura   Antibiotics: None   Estimated Blood Loss: 10 ml   Total IV Fluids: 400 cc ml  Urine Output: none, foley placed after procedure  Specimens: bilateral fallopian tubes to pathology   Complications: no complications   Indications: Renee Velez is a 25 y.o. [id number] with undesired fertility, status post vaginal delivery, desires permanent sterilization.  Other reversible forms of contraception were discussed with patient; she declines all other modalities. Risks of procedure discussed with patient including but not limited to: risk of regret, permanence of method, bleeding, infection, injury to surrounding organs and need for additional procedures.  Failure risk of 1-2 % with increased risk of ectopic gestation if pregnancy occurs and possibility of post-tubal pain syndrome also discussed with patient.  Findings: Normal uterus, tubes, and ovaries.  Procedure Details: The patient was taken to the operating room where her  anesthesia was dosed up to surgical level and found to be adequate.  She was then placed in the dorsal supine position and prepped and draped in sterile fashion.  After an adequate timeout was performed, attention was turned to the patient's abdomen where a small transverse skin incision was made under the umbilical fold. The incision was taken down to the layer of fascia using the scalpel, and fascia was incised, and extended bilaterally using Mayo scissors. The peritoneum was entered in a sharp fashion.   The right fallopian tube was grasped with a babcock  and followed out to the fimbriated end. Making sure to incorporate the fimbriae approximately 80-90% of the fallopian tube was brought up and clamped with a Tresa Endo. A second Tresa Endo was then placed below the first. The fallopian tube was then removed sharply using Metzenbaum scissors and sent for pathology. Beneath the superior Kelly the tube was ligated using a fore and aft stitch of 2-0 Monocryl, and the Tresa Endo was removed. After ensuring adequate hemostasis a second fore and aft stitch was placed below the inferior Kelly, the clamp was removed, and adequate hemostasis was noted. The same procedure was carried out on the left fallopian tube allowing for bilateral salpingectomy. Of note somewhat less of the tube was removed on the left side due to a large vessel in the mesosalpinx, but >50% of the left fallopian tube and entire portion of the fimbriae was removed.   The instruments were then removed from the patient's abdomen and the fascial incision was repaired with 0 Vicryl, and the skin was closed with a 4-0 Vicryl subcuticular stitch. The patient tolerated the procedure well.  Instrument, sponge, and needle counts were correct times three.  The patient was then taken to the recovery room awake and in stable condition.  Disposition: PACU - hemodynamically stable.    Signed: Venora Maples, MD, MPH [facility])

## 2020-06-24 NOTE — Progress Notes (Signed)
Patient desires permanent sterilization, bilateral salpingectomy if technically feasible.  Other reversible forms of contraception were discussed with patient; she declines all other modalities. Risks of procedure discussed with patient including but not limited to: risk of regret, permanence of method, bleeding, infection, injury to surrounding organs and need for additional procedures.  Failure risk of about 1% with increased risk of ectopic gestation if pregnancy occurs was also discussed with patient.  Also discussed possibility of post-tubal pain syndrome. The patient concurred with the proposed plan, giving informed written consent for the procedures.  Patient has been NPO since last night she will remain NPO for procedure. Anesthesia and OR aware.

## 2020-06-24 NOTE — Progress Notes (Addendum)
Patient ID: Renee Velez, female   DOB: 1995/09/17, 25 y.o.   MRN: [id number]   POSTPARTUM PROGRESS NOTE  Post Partum Day 1  Subjective:  Renee Velez is a 25 y.o. [id number] s/p SVD at [redacted]w[redacted]d.  No acute events overnight.  Pt denies problems with ambulating, voiding or po intake.  She endorses nausea or vomiting and states that she threw up about 4-5 times last night. Pain is well controlled. She has not had flatus. She has not had bowel movement.  Lochia minimal.   Objective: Blood pressure (!) 94/53, pulse (!) 52, temperature 97.6 F (36.4 C), temperature source Oral, resp. rate 17, height 5\' 4"  (1.626 m), weight 71.7 kg, last menstrual period 09/22/2019, not currently breastfeeding.  Physical Exam:  General: alert, cooperative and no distress Chest: no respiratory distress Abdomen: soft, nontender Uterine Fundus: firm, appropriately tender, 2 below umbilicus  Extremities: No LE edema Skin: warm, dry  Recent Labs    06/23/20 1248  HGB 10.3*  HCT 31.2*    Assessment/Plan: Renee Velez is a 25 y.o. 22 s/p SVD at [redacted]w[redacted]d.   PPD#1 - Doing well Contraception: BTL 06/24/20 ~1100, currently NPO  Feeding: Breast Dispo: Plan for discharge PPD#1-2 Seizure Disorder: continue keppra 1000 mg bid  Hx of Depression: SW consult ordered    LOS: 1 day   Shams, Rayad B, Medical Student 06/24/2020, 2:06 AM     I saw and evaluated the patient. I agree with the findings and the plan of care as documented in the medical student's note.  06/26/2020, MD [facility] Family Medicine Fellow, [facility] for [facility], [facility]

## 2020-06-24 NOTE — Progress Notes (Signed)
Padded rails added to bed.

## 2020-06-24 NOTE — Anesthesia Postprocedure Evaluation (Signed)
Anesthesia Post Note  Patient: Renee Velez  Procedure(s) Performed: POST PARTUM TUBAL LIGATION (N/A )     Patient location during evaluation: Mother Baby Anesthesia Type: Epidural Level of consciousness: awake and alert Pain management: pain level controlled Vital Signs Assessment: post-procedure vital signs reviewed and stable Respiratory status: spontaneous breathing, nonlabored ventilation and respiratory function stable Cardiovascular status: stable Postop Assessment: no headache, no backache and epidural receding Anesthetic complications: no   No complications documented.  Last Vitals:  Vitals:   06/24/20 1300 06/24/20 1315  BP: (!) 92/52 (!) 100/57  Pulse: (!) 58 67  Resp: 13 20  Temp:    SpO2: 97% 99%    Last Pain:  Vitals:   06/24/20 1330  TempSrc:   PainSc: 2    Pain Goal: Patients Stated Pain Goal: 3 (06/24/20 1330)  LLE Motor Response: Purposeful movement (06/24/20 1330) LLE Sensation: Tingling (06/24/20 1330) RLE Motor Response: Purposeful movement (06/24/20 1330) RLE Sensation: Tingling (06/24/20 1330)     Epidural/Spinal Function Cutaneous sensation: Tingles (06/24/20 1259), Patient able to flex knees: Yes (06/24/20 1259), Patient able to lift hips off bed: Yes (06/24/20 1259), Back pain beyond tenderness at insertion site: No (06/24/20 1259), Progressively worsening motor and/or sensory loss: No (06/24/20 1259), Bowel and/or bladder incontinence post epidural: No (06/24/20 1259)  Kalem Rockwell

## 2020-06-24 NOTE — Transfer of Care (Signed)
Immediate Anesthesia Transfer of Care Note  Patient: Renee Velez  Procedure(s) Performed: POST PARTUM TUBAL LIGATION (N/A )  Patient Location: PACU  Anesthesia Type:Epidural  Level of Consciousness: awake, alert , oriented and patient cooperative  Airway & Oxygen Therapy: Patient Spontanous Breathing  Post-op Assessment: Report given to RN and Post -op Vital signs reviewed and stable  Post vital signs: Reviewed and stable  Last Vitals:  Vitals Value Taken Time  BP    Temp    Pulse 65 06/24/20 1242  Resp 11 06/24/20 1242  SpO2 99 % 06/24/20 1242  Vitals shown include unvalidated device data.  Last Pain:  Vitals:   06/24/20 1106  TempSrc:   PainSc: 0-No pain         Complications: No complications documented.

## 2020-06-24 NOTE — Addendum Note (Signed)
Addendum  created 06/24/20 1622 by Shanon Payor, CRNA   Clinical Note Signed

## 2020-06-24 NOTE — Progress Notes (Signed)
Epidural intact to back. ?

## 2020-06-24 NOTE — Anesthesia Preprocedure Evaluation (Signed)
Anesthesia Evaluation  Patient identified by MRN, date of birth, ID band Patient awake    Reviewed: Allergy & Precautions, Patient's Chart, lab work & pertinent test results  History of Anesthesia Complications Negative for: history of anesthetic complications  Airway Mallampati: II  TM Distance: >3 FB Neck ROM: Full    Dental no notable dental hx.    Pulmonary Current Smoker,    Pulmonary exam normal        Cardiovascular negative cardio ROS Normal cardiovascular exam     Neuro/Psych Seizures -, Well Controlled,  PSYCHIATRIC DISORDERS Anxiety Depression Bipolar Disorder    GI/Hepatic negative GI ROS, Neg liver ROS,   Endo/Other  negative endocrine ROS  Renal/GU negative Renal ROS  negative genitourinary   Musculoskeletal negative musculoskeletal ROS (+)   Abdominal   Peds  Hematology negative hematology ROS (+)   Anesthesia Other Findings Day of surgery medications reviewed with patient.  Reproductive/Obstetrics                             Anesthesia Physical  Anesthesia Plan  ASA: III  Anesthesia Plan: Epidural   Post-op Pain Management:    Induction:   PONV Risk Score and Plan: Treatment may vary due to age or medical condition  Airway Management Planned: Natural Airway  Additional Equipment:   Intra-op Plan:   Post-operative Plan:   Informed Consent: I have reviewed the patients History and Physical, chart, labs and discussed the procedure including the risks, benefits and alternatives for the proposed anesthesia with the patient or authorized representative who has indicated his/her understanding and acceptance.     Dental Advisory Given  Plan Discussed with: Anesthesiologist and CRNA  Anesthesia Plan Comments:         Anesthesia Quick Evaluation

## 2020-06-24 NOTE — Discharge Summary (Signed)
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 yo [id number] admitted to the hospital 06/23/2020 for induction of labor.  Indication for induction: Elective.  On admission, pt received cytotec. She had AROM for clear fluid and rapidly progressed to complete cervical dilation. She delivered without complication s/p a brief second stage. Membrane Rupture Time/Date: 9:05 PM ,06/23/2020   Delivery Method:Vaginal, Spontaneous  Episiotomy: None  Lacerations:  None  Details of delivery can be found in separate delivery note. Postpartum BTL was performed on PPD#1 without complication. Rhogam also administered. Patient had a routine postpartum course. Patient is discharged home 06/25/20.  Newborn Data: Birth date:06/23/2020  Birth time:11:56 PM  Gender:Female  Living status:Living  Apgars:8 ,9  Weight:2761 g   Magnesium Sulfate received: No BMZ received: No Rhophylac:Yes MMR:N/A T-DaP:Given prenatally Flu: Yes Transfusion:No  Physical exam  Vitals:   06/24/20 1544 06/24/20 2000 06/25/20 0000 06/25/20 0400  BP:  116/80 118/79 118/89 113/73  Pulse: (!) 48 60 72 (!) 55  Resp: (!) '22 18 17 16  ' Temp: 97.8 F (36.6 C) 98.4 F (36.9 C) 98 F (36.7 C) 97.8 F (36.6 C)  TempSrc: Oral Oral Oral Oral  SpO2: 100% 99% 100% 100%  Weight:      Height:       General: alert, cooperative and no distress Lochia: appropriate Uterine Fundus: firm Incision: Honeycomb dressing clean/dry/intact DVT Evaluation: No evidence of DVT seen on physical exam. No cords or calf tenderness. No significant calf/ankle edema. Labs: Lab Results  Component Value Date   WBC 12.9 (H) 06/23/2020   HGB 10.3 (L) 06/23/2020   HCT 31.2 (L) 06/23/2020   MCV 88.9 06/23/2020   PLT 254 06/23/2020   CMP Latest Ref Rng & Units 12/03/2018  Glucose 70 - 99 mg/dL 89  BUN 6 - 20 mg/dL 7  Creatinine 0.44 - 1.00 mg/dL 0.61  Sodium 135 - 145 mmol/L 137  Potassium 3.5 - 5.1 mmol/L 3.4(L)  Chloride 98 - 111 mmol/L 106  CO2 22 - 32 mmol/L 22  Calcium 8.9 - 10.3 mg/dL 8.9  Total Protein 6.0 - 8.5 g/dL -  Total Bilirubin 0.0 - 1.2 mg/dL -  Alkaline Phos 39 - 117 IU/L -  AST 0 - 40 IU/L -  ALT 0 - 32 IU/L -   Edinburgh Score: Edinburgh Postnatal Depression Scale Screening Tool 06/25/2020  I have been able to laugh and see the funny side of things. 0  I have looked forward with enjoyment to things. 0  I have blamed myself unnecessarily when things went wrong. 0  I have been anxious or worried for no good reason. 0  I have felt scared or panicky for no good reason. 0  Things have been getting on top of me. 1  I have been so unhappy that I have had difficulty sleeping. 0  I have felt sad or miserable. 0  I have been so unhappy that I have been crying. 0  The thought of harming myself has occurred to me. 0  Edinburgh Postnatal Depression Scale Total 1     After visit meds:  Allergies as of 06/25/2020      Reactions   Nickel Rash      Medication List    STOP taking these medications   aspirin 81 MG chewable tablet   Blood  Pressure Kit Devi     TAKE these medications   acetaminophen 325 MG tablet Commonly known as: Tylenol Take 2 tablets (650 mg total) by mouth every 6 (six) hours.   coconut oil Oil Apply 1 application topically as needed (nipple pain).   ferrous sulfate 325 (65 FE) MG tablet Take 1 tablet (325 mg total) by mouth every other day. Start taking on: Jun 26, 2020   ibuprofen 600 MG tablet Commonly known as: ADVIL Take 1 tablet (600 mg total) by mouth every 6 (six) hours.   levETIRAcetam 1000 MG tablet Commonly known as: KEPPRA Take 1 tablet (1,000 mg total) by mouth 2 (two) times daily. What changed: Another medication with the same name was removed. Continue taking this medication, and follow the directions you see here.   oxyCODONE 5 MG immediate release tablet Commonly known as: Oxy IR/ROXICODONE Take 1 tablet (5 mg total) by mouth every 6 (six) hours as needed for up to 3 days for severe pain or breakthrough pain.   prenatal multivitamin Tabs tablet Take 1 tablet by mouth daily at 12 noon.        Discharge home in stable condition Infant Feeding: breast and formula Infant Disposition:home with mother Discharge instruction: per After Visit Summary and Postpartum booklet. Activity: Advance as tolerated. Pelvic rest for 6 weeks.  Diet: routine diet Future Appointments:No future appointments. Follow up Visit: Message sent to [facility] by Dr. Astrid Drafts  Please schedule this patient for a In person postpartum visit in 6 weeks with the following provider: Any provider. Additional Postpartum F/U:Postpartum Depression checkup, 1 week BP check Low risk pregnancy complicated by: Seizure Disorder (keppra in pregnancy), Anxiety/Depression, rh negative, Tobacco use, h/o gHTN Delivery mode:  Vaginal, Spontaneous  Anticipated Birth Control:  BTL done PP  Kiondra Caicedo, Gildardo Cranker, MD OB Fellow, [facility] 06/25/2020 10:04 AM

## 2020-06-25 ENCOUNTER — Encounter (HOSPITAL_COMMUNITY): Payer: Self-pay | Admitting: Obstetrics & Gynecology

## 2020-06-25 ENCOUNTER — Other Ambulatory Visit (HOSPITAL_COMMUNITY): Payer: Self-pay

## 2020-06-25 DIAGNOSIS — G40909 Epilepsy, unspecified, not intractable, without status epilepticus: Secondary | ICD-10-CM

## 2020-06-25 DIAGNOSIS — Z3A39 39 weeks gestation of pregnancy: Secondary | ICD-10-CM

## 2020-06-25 DIAGNOSIS — O99354 Diseases of the nervous system complicating childbirth: Principal | ICD-10-CM

## 2020-06-25 LAB — RH IG WORKUP (INCLUDES ABO/RH)
Fetal Screen: NEGATIVE
Gestational Age(Wks): 39.1
Unit division: 0

## 2020-06-25 LAB — BIRTH TISSUE RECOVERY COLLECTION (PLACENTA DONATION)

## 2020-06-25 MED ORDER — OXYCODONE HCL 5 MG PO TABS
5.0000 mg | ORAL_TABLET | Freq: Four times a day (QID) | ORAL | 0 refills | Status: AC | PRN
  Filled 2020-06-25: qty 12, 3d supply, fill #0

## 2020-06-25 MED ORDER — COCONUT OIL OIL: 1.0000 "application " | TOPICAL_OIL | 0 refills | Status: AC | PRN

## 2020-06-25 MED ORDER — ACETAMINOPHEN 325 MG PO TABS: 650.0000 mg | ORAL_TABLET | Freq: Four times a day (QID) | ORAL | Status: AC

## 2020-06-25 MED ORDER — LEVETIRACETAM 1000 MG PO TABS
1000.0000 mg | ORAL_TABLET | Freq: Two times a day (BID) | ORAL | 1 refills | Status: AC
  Filled 2020-06-25: qty 60, 30d supply, fill #0

## 2020-06-25 MED ORDER — FERROUS SULFATE 325 (65 FE) MG PO TABS
325.0000 mg | ORAL_TABLET | ORAL | 2 refills | Status: AC
  Filled 2020-06-25: qty 30, 60d supply, fill #0

## 2020-06-25 MED ORDER — IBUPROFEN 600 MG PO TABS
600.0000 mg | ORAL_TABLET | Freq: Four times a day (QID) | ORAL | 0 refills | Status: AC
  Filled 2020-06-25: qty 30, 8d supply, fill #0

## 2020-06-26 LAB — SURGICAL PATHOLOGY

## 2020-07-04 ENCOUNTER — Ambulatory Visit: Payer: Medicaid Other

## 2020-08-08 ENCOUNTER — Ambulatory Visit: Payer: Medicaid Other | Admitting: Obstetrics and Gynecology

## 2020-11-09 ENCOUNTER — Encounter: Payer: Self-pay | Admitting: Radiology

## 2021-03-04 ENCOUNTER — Other Ambulatory Visit (HOSPITAL_COMMUNITY): Payer: Self-pay

## 2021-03-05 ENCOUNTER — Other Ambulatory Visit (HOSPITAL_COMMUNITY): Payer: Self-pay

## 2021-05-01 ENCOUNTER — Emergency Department (HOSPITAL_COMMUNITY)
Admission: EM | Admit: 2021-05-01 | Discharge: 2021-05-01 | Disposition: A | Discharge status: Home / Self Care | Payer: Medicaid Other | Attending: Emergency Medicine | Admitting: Emergency Medicine

## 2021-05-01 ENCOUNTER — Encounter (HOSPITAL_COMMUNITY): Payer: Self-pay | Admitting: Emergency Medicine

## 2021-05-01 DIAGNOSIS — Z20822 Contact with and (suspected) exposure to covid-19: Secondary | ICD-10-CM | POA: Diagnosis not present

## 2021-05-01 DIAGNOSIS — J029 Acute pharyngitis, unspecified: Secondary | ICD-10-CM | POA: Diagnosis present

## 2021-05-01 DIAGNOSIS — J020 Streptococcal pharyngitis: Secondary | ICD-10-CM | POA: Insufficient documentation

## 2021-05-01 DIAGNOSIS — Z28310 Unvaccinated for covid-19: Secondary | ICD-10-CM | POA: Insufficient documentation

## 2021-05-01 LAB — RESP PANEL BY RT-PCR (FLU A&B, COVID) ARPGX2
Influenza A by PCR: NEGATIVE
Influenza B by PCR: NEGATIVE
SARS Coronavirus 2 by RT PCR: NEGATIVE

## 2021-05-01 LAB — GROUP A STREP BY PCR: Group A Strep by PCR: DETECTED — AB

## 2021-05-01 MED ORDER — ACETAMINOPHEN 500 MG PO TABS
1000.0000 mg | ORAL_TABLET | ORAL | Status: AC
  Administered 2021-05-01: 1000 mg via ORAL
  Filled 2021-05-01: qty 2

## 2021-05-01 MED ORDER — AMOXICILLIN-POT CLAVULANATE 875-125 MG PO TABS: 1.0000 | ORAL_TABLET | Freq: Two times a day (BID) | ORAL | 0 refills | Status: AC

## 2021-05-01 MED ORDER — PENICILLIN G BENZATHINE 1200000 UNIT/2ML IM SUSY
1.2000 10*6.[IU] | PREFILLED_SYRINGE | Freq: Once | INTRAMUSCULAR | Status: AC
  Administered 2021-05-01: 1.2 10*6.[IU] via INTRAMUSCULAR
  Filled 2021-05-01: qty 2

## 2021-05-01 NOTE — Discharge Instructions (Addendum)
Please return to the ED with any new or worsening signs or symptoms such as trouble swallowing, trouble breathing, dark urine ?Please pick up antibiotics I sent in for you.  You will take these twice a day for the next 5 days for possible infection ?Please refer to the attached informational guide concerning strep throat ?I have written you out of work until 05/03/2021 ?

## 2021-05-01 NOTE — ED Provider Notes (Signed)
?[facility]-EMERGENCY DEPT ?Provider Note ? ? ?CSN: [id number] ?Arrival date & time: 05/01/21  0867 ? ?  ? ?History ? ?Chief Complaint  ?Patient presents with  ? Sore Throat  ? ? ?Renee Velez is a 26 y.o. female with no medical history.  Patient states that beginning last night she began experiencing sore throat, cough, left ear pain, swollen lymph nodes and headache.  Patient denies being vaccinated for the flu or COVID.  Patient states that her son has been experiencing similar symptoms.  Patient reports taking Chloraseptic throat numbing spray as well as cough drops for relief of symptoms however none has been provided.  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  Patient denies any fevers, wheezing, shortness of breath, nausea, vomiting, diarrhea, abdominal pain. ? ? ?Sore Throat ?Associated symptoms include headaches. Pertinent negatives include no abdominal pain and no shortness of breath.  ? ?  ? ?Home Medications ?Prior to Admission medications   ?Medication Sig Start Date End Date Taking? Authorizing Provider  ?amoxicillin-clavulanate (AUGMENTIN) 875-125 MG tablet Take 1 tablet by mouth 2 (two) times daily. 05/01/21  Yes Al Decant, PA-C  ?acetaminophen (TYLENOL) 325 MG tablet Take 2 tablets (650 mg total) by mouth every 6 (six) hours. 06/25/20   Sheila Oats, MD  ?coconut oil OIL Apply 1 application topically as needed (nipple pain). 06/25/20   Sheila Oats, MD  ?ferrous sulfate 325 (65 FE) MG tablet Take 1 tablet (325 mg total) by mouth every other day. 06/26/20   Sheila Oats, MD  ?ibuprofen (ADVIL) 600 MG tablet Take 1 tablet (600 mg total) by mouth every 6 (six) hours. 06/25/20   Sheila Oats, MD  ?levETIRAcetam (KEPPRA) 1000 MG tablet Take 1 tablet (1,000 mg total) by mouth 2 (two) times daily. 06/25/20 07/25/20  Sheila Oats, MD  ?Prenatal Vit-Fe Fumarate-FA (PRENATAL MULTIVITAMIN) TABS tablet Take 1 tablet by  mouth daily at 12 noon.    [provider]  ?   ? ?Allergies    ?Nickel   ? ?Review of Systems   ?Review of Systems  ?Constitutional:  Negative for fever.  ?HENT:  Positive for ear pain and sore throat.   ?Respiratory:  Positive for cough. Negative for shortness of breath and wheezing.   ?Gastrointestinal:  Negative for abdominal pain, diarrhea, nausea and vomiting.  ?Neurological:  Positive for headaches.  ?Hematological:  Positive for adenopathy.  ?All other systems reviewed and are negative. ? ?Physical Exam ?Updated Vital Signs ?BP 120/78   Pulse 87   Temp 98.3 ?F (36.8 ?C) (Oral)   Resp 18   SpO2 99%  ?Physical Exam ?Vitals and nursing note reviewed.  ?Constitutional:   ?   General: She is not in acute distress. ?   Appearance: She is well-developed. She is not ill-appearing, toxic-appearing or diaphoretic.  ?HENT:  ?   Head: Normocephalic and atraumatic.  ?   Right Ear: Tympanic membrane normal. No drainage or tenderness. No middle ear effusion. Tympanic membrane is not erythematous.  ?   Left Ear: Tympanic membrane normal. Tenderness present. No drainage.  No middle ear effusion. Tympanic membrane is not erythematous.  ?   Nose: Congestion present. No rhinorrhea.  ?   Mouth/Throat:  ?   Mouth: Mucous membranes are moist.  ?   Pharynx: Uvula midline. Posterior oropharyngeal erythema present. No oropharyngeal exudate or uvula swelling.  ?   Tonsils: No tonsillar exudate or tonsillar  abscesses. 0 on the right. 0 on the left.  ?Eyes:  ?   Conjunctiva/sclera: Conjunctivae normal.  ?   Pupils: Pupils are equal, round, and reactive to light.  ?Cardiovascular:  ?   Rate and Rhythm: Normal rate and regular rhythm.  ?Pulmonary:  ?   Effort: Pulmonary effort is normal.  ?   Breath sounds: Normal breath sounds. No wheezing.  ?Abdominal:  ?   General: Bowel sounds are normal.  ?   Palpations: Abdomen is soft.  ?   Tenderness: There is no abdominal tenderness.  ?Musculoskeletal:  ?   Cervical back: Normal  range of motion and neck supple.  ?Lymphadenopathy:  ?   Cervical: No cervical adenopathy.  ?Skin: ?   General: Skin is warm and dry.  ?   Capillary Refill: Capillary refill takes less than 2 seconds.  ?Neurological:  ?   General: No focal deficit present.  ?   Mental Status: She is alert and oriented to person, place, and time.  ?   GCS: GCS eye subscore is 4. GCS verbal subscore is 5. GCS motor subscore is 6.  ?   Cranial Nerves: Cranial nerves 2-12 are intact. No cranial nerve deficit.  ?   Sensory: Sensation is intact. No sensory deficit.  ?   Motor: Motor function is intact. No weakness.  ?   Coordination: Coordination is intact. [facility] normal.  ? ? ?ED Results / Procedures / Treatments   ?Labs ?(all labs ordered are listed, but only abnormal results are displayed) ?Labs Reviewed  ?GROUP A STREP BY PCR - Abnormal; Notable for the following components:  ?    Result Value  ? Group A Strep by PCR DETECTED (*)   ? All other components within normal limits  ?RESP PANEL BY RT-PCR (FLU A&B, COVID) ARPGX2  ? ? ?EKG ?None ? ?Radiology ?No results found. ? ?Procedures ?Procedures  ? ? ?Medications Ordered in ED ?Medications  ?acetaminophen (TYLENOL) tablet 1,000 mg (1,000 mg Oral Given 05/01/21 0745)  ?penicillin g benzathine (BICILLIN LA) 1200000 UNIT/2ML injection 1.2 Million Units (1.2 Million Units Intramuscular Given 05/01/21 0857)  ? ? ?ED Course/ Medical Decision Making/ A&P ?  ?                        ?Medical Decision Making ?Risk ?OTC drugs. ? ? ?26 year old female presents ED for evaluation of sore throat.  Please see HPI for further details. ? ?On examination, the patient is afebrile.  The patient is nontachycardic.  Patient lung sounds are clear bilaterally.  Patient abdomen is soft compressible in all 4 quadrants.  Patient posterior oropharynx is erythematous however she has no tonsillar swelling, no exudate, no cervical adenopathy.  Patient endorsing cough as well.  Patient handling secretions  appropriately, uvula midline, no trismus, no drooling, no updated voice, no swollen tongue.  No signs of RPA, PTA. ? ?Patient worked up utilizing the following labs and imaging studies interpreted by me personally: ?- Patient respiratory panel negative for COVID and flu ?- Patient group A strep positive.  Patient treated with 1,200,000 units benzathine G penicillin IM injection. ? ?Patient left ear has no signs of bulging tympanic membrane on examination however patient is endorsing pain.  Patient will be placed on 5-day course of Augmentin for possible otitis media.  Patient denies current breast-feeding. ? ?At this time, I feel this patient stable for discharge home.  Patient has been provided with return precautions  and she voiced understanding.  Patient had all of her questions answered to satisfaction.  Patient stable. ?Final Clinical Impression(s) / ED Diagnoses ?Final diagnoses:  ?Strep pharyngitis  ? ? ?Rx / DC Orders ?ED Discharge Orders   ? ?      Ordered  ?  amoxicillin-clavulanate (AUGMENTIN) 875-125 MG tablet  2 times daily       ? 05/01/21 0902  ? ?  ?  ? ?  ? ? ?  ?Al Decant, PA-C ?05/01/21 8144 ? ?  ?Derwood Kaplan, MD ?05/03/21 [phone] ? ?

## 2021-05-01 NOTE — ED Triage Notes (Signed)
Per patient, states sore throat since yesterday- ?

## 2021-07-18 IMAGING — US US MFM OB FOLLOW-UP
1 series · 13 of 28 positions shown · non-contrast
Comparison: none

[Series 1: us mfm ob follow-up · 92 acquisitions, 13 frames shown]
[im 4/92]
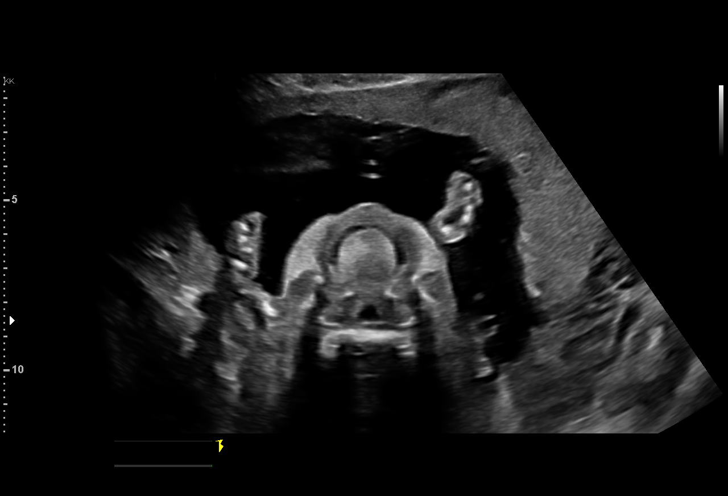
[im 11/92]
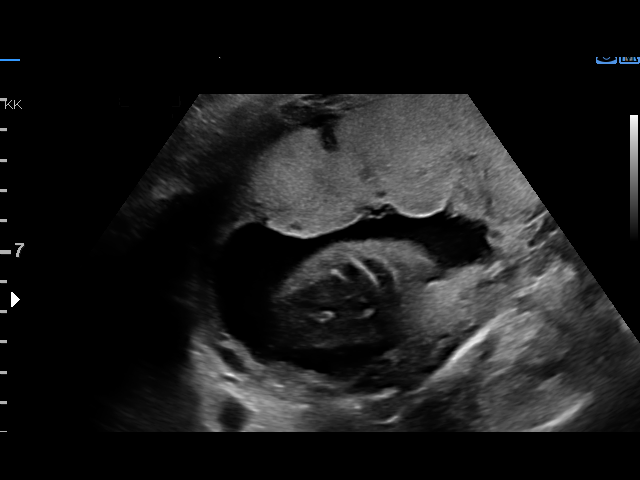
[im 17/92]
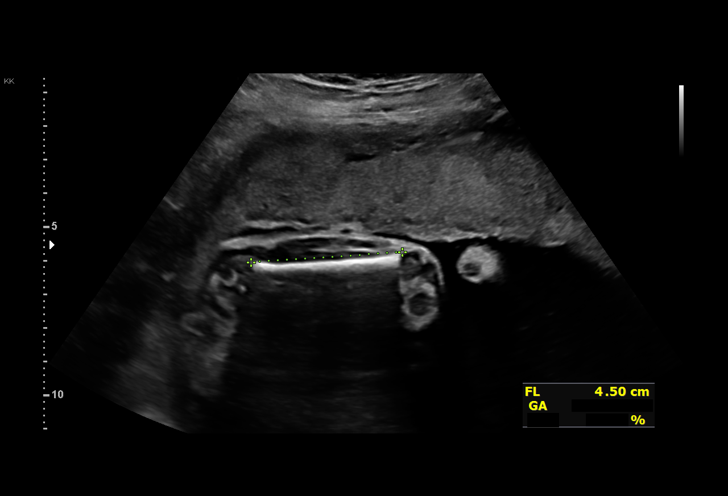
[im 24/92]
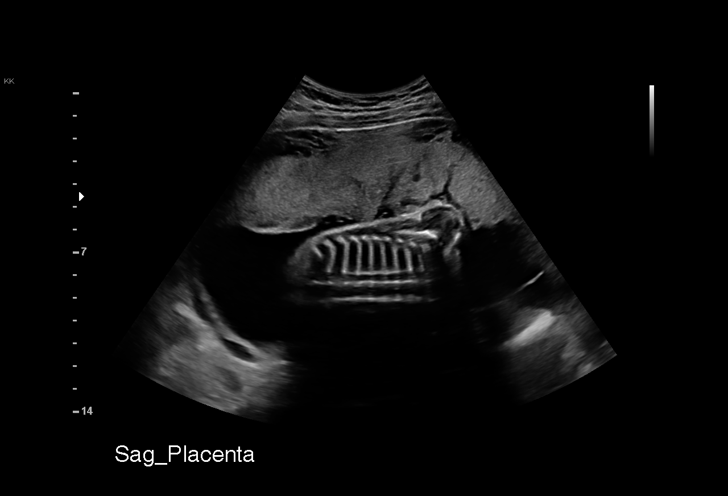
[im 31/92]
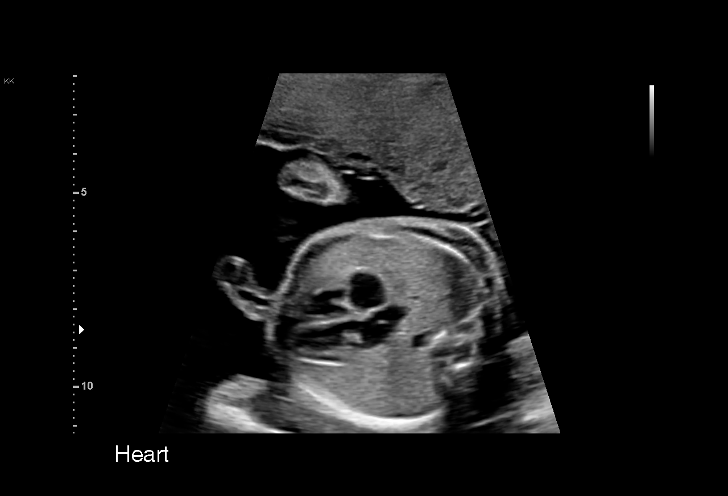
[im 38/92]
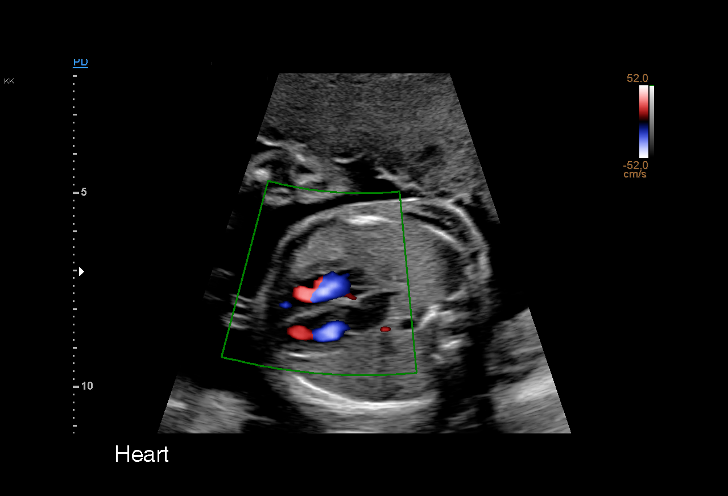
[im 48/92]
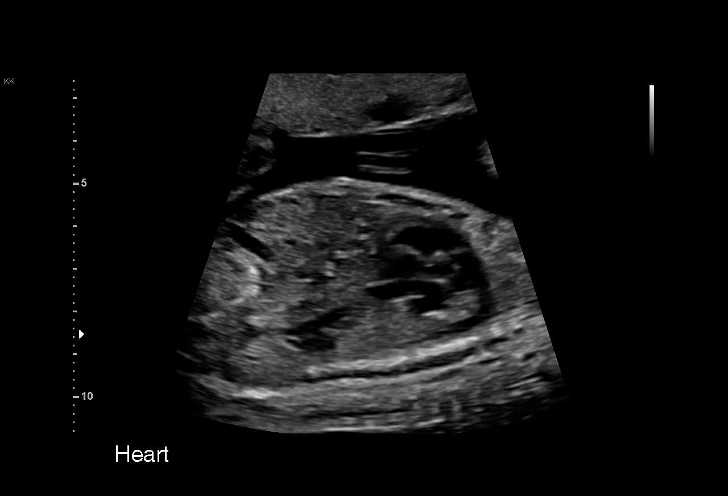
[im 54/92]
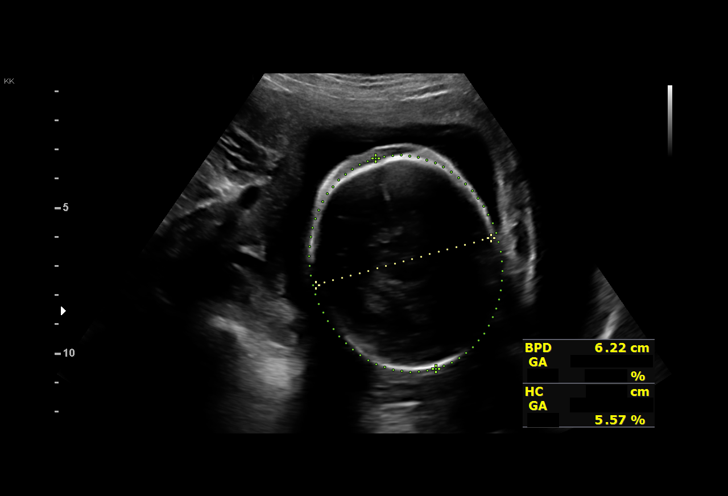
[im 61/92]
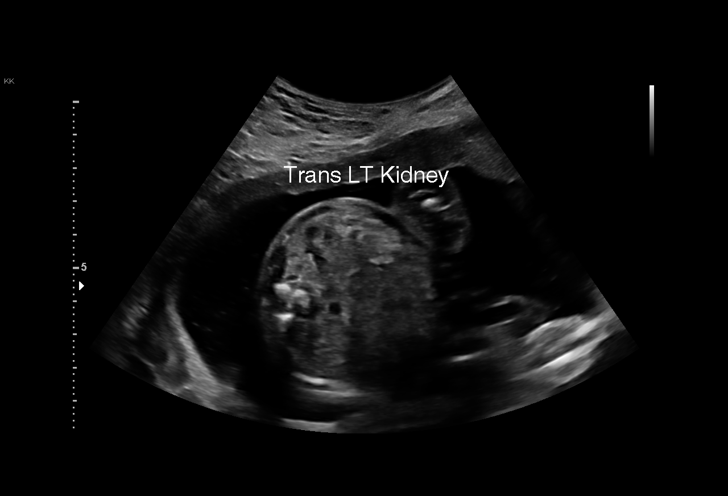
[im 68/92]
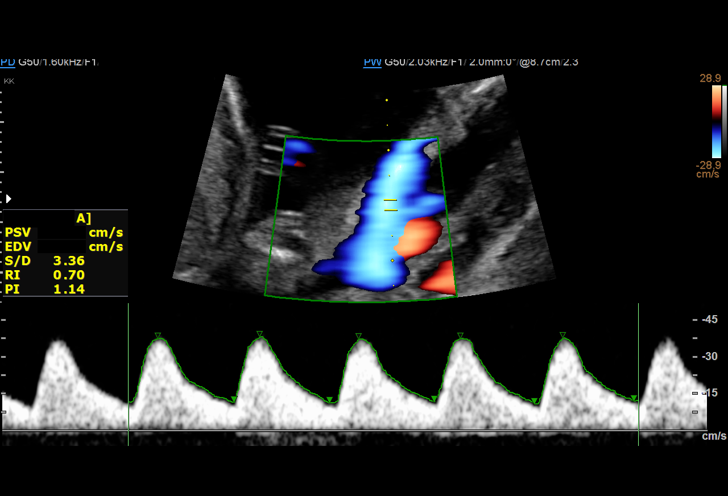
[im 75/92]
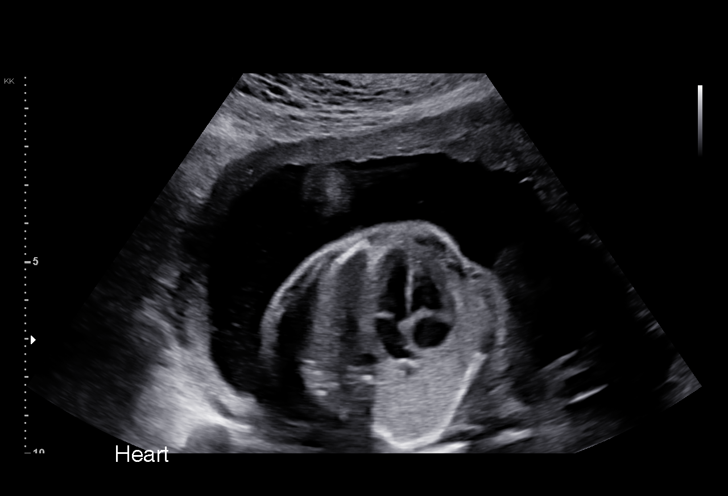
[im 81/92]
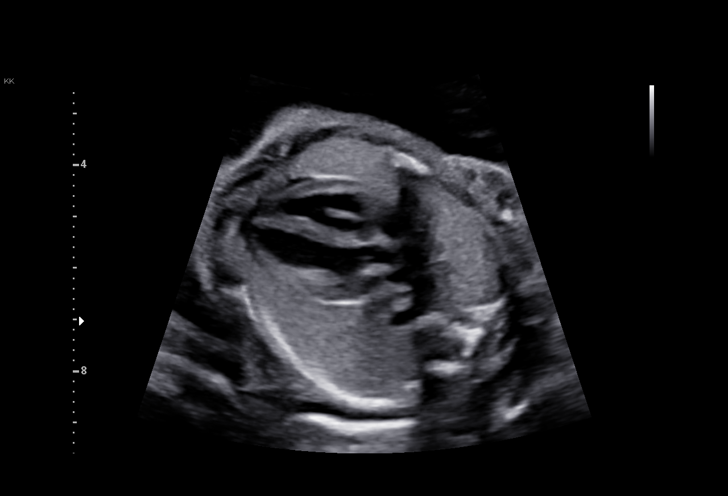
[im 88/92]
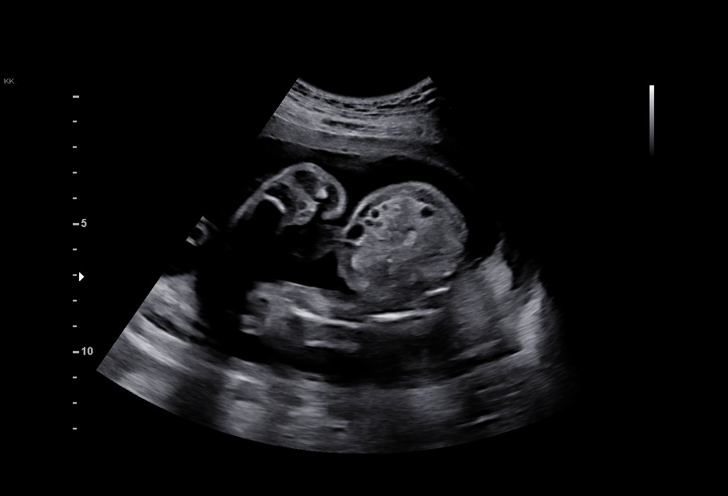

[13 of 28 positions shown; findings below may reference images not displayed]

Indications

 Small for gestational age fetus affecting
 management of mother
 Seizure disorder (Keppra)
 Poor obstetric history: Previous fetal growth
 restriction (FGR) 9022 g at term
 Cystic Fibrosis (CF) [place], second trimester
 Rh negative state in antepartum
 Poor obstetric history: Previous
 preeclampsia / eclampsia/gestational HTN
 25 weeks gestation of pregnancy
 Encounter for other antenatal screening
 follow-up
Fetal Evaluation

 Num Of Fetuses:         1
 Fetal Heart Rate(bpm):  150
 Cardiac Activity:       Observed
 Presentation:           Cephalic
 Placenta:               Anterior
 P. Cord Insertion:      Previously Visualized

 Amniotic Fluid
 AFI FV:      Within normal limits

                             Largest Pocket(cm)

Biometry

 BPD:      62.7  mm     G. Age:  25w 3d         51  %    CI:        80.52   %    70 - 86
                                                         FL/HC:      20.3   %    18.7 -
 HC:      220.7  mm     G. Age:  24w 1d          5  %    HC/AC:      1.10        1.04 -
 AC:      200.5  mm     G. Age:  24w 5d         27  %    FL/BPD:     71.3   %    71 - 87
 FL:       44.7  mm     G. Age:  24w 5d         25  %    FL/AC:      22.3   %    20 - 24

 Est. FW:     724  gm    1 lb 10 oz      22  %
OB History

 Gravidity:    6         Term:   4        Prem:   0        SAB:   1
 TOP:          0       Ectopic:  0        Living: 4
Gestational Age

 LMP:           25w 1d        Date:  09/24/19                 EDD:   06/30/20
 U/S Today:     24w 5d                                        EDD:   07/03/20
 Best:          25w 1d     Det. By:  LMP  (09/24/19)          EDD:   06/30/20
Anatomy

 Cranium:               Appears normal         LVOT:                   Appears normal
 Cavum:                 Appears normal         Aortic Arch:            Appears normal
 Ventricles:            Appears normal         Ductal Arch:            Appears normal
 Choroid Plexus:        Previously seen        Diaphragm:              Appears normal
 Cerebellum:            Previously seen        Stomach:                Appears normal, left
                                                                       sided
 Posterior Fossa:       Previously seen        Abdomen:                Appears normal
 Nuchal Fold:           Not applicable (>20    Abdominal Wall:         Previously seen
                        wks GA)
 Face:                  Appears normal         Cord Vessels:           Previously seen
                        (orbits and profile)
 Lips:                  Previously seen        Kidneys:                Appear normal
 Palate:                Previously seen        Bladder:                Appears normal
 Thoracic:              Appears normal         Spine:                  Previously seen
 Heart:                 Appears normal         Upper Extremities:      Previously seen
                        (4CH, axis, and
                        situs)
 RVOT:                  Appears normal         Lower Extremities:      Previously seen

 Other:  Heels/feet and open hands/5th digits previously visualized. Nasal
         bone previously visualized. Lenses previously visualized. VC, 3VV
         and 3VTV previously visualized. Fetus appears to be a male.
Doppler - Fetal Vessels

 Umbilical Artery
  S/D     %tile      RI    %tile                             ADFV    RDFV
  3.36       51     0.7       56                                No      No

Cervix Uterus Adnexa

 Cervix
 Not visualized (advanced GA >59wks)
Comments

 This patient was seen for a follow up growth scan due to a
 small for gestational age fetus that was noted during her prior
 ultrasound exam.  The patient reports a history of having
 growth restricted fetuses in her prior pregnancies.  She
 denies any problems since her last exam.
 She was informed that the fetal growth and amniotic fluid
 level appears appropriate for her gestational age.
 Doppler studies of the umbilical arteries performed today
 continues to show normal forward flow.  There were no signs
 of absent or reversed end-diastolic flow noted.
 Due to her history of fetal growth restriction in her prior
 pregnancies, we will continue to follow her with growth
 ultrasounds throughout her pregnancy.
 A follow-up growth scan was scheduled in 4 weeks.

## 2021-08-01 ENCOUNTER — Other Ambulatory Visit: Payer: Self-pay

## 2021-08-01 ENCOUNTER — Encounter (HOSPITAL_COMMUNITY): Payer: Self-pay | Admitting: Emergency Medicine

## 2021-08-01 ENCOUNTER — Emergency Department (HOSPITAL_COMMUNITY)
Admission: EM | Admit: 2021-08-01 | Discharge: 2021-08-02 | Disposition: A | Discharge status: Home / Self Care | Payer: Medicaid Other | Attending: Emergency Medicine | Admitting: Emergency Medicine

## 2021-08-01 ENCOUNTER — Emergency Department (HOSPITAL_COMMUNITY): Payer: Medicaid Other

## 2021-08-01 DIAGNOSIS — S4991XA Unspecified injury of right shoulder and upper arm, initial encounter: Secondary | ICD-10-CM | POA: Diagnosis present

## 2021-08-01 DIAGNOSIS — W1789XA Other fall from one level to another, initial encounter: Secondary | ICD-10-CM | POA: Diagnosis not present

## 2021-08-01 NOTE — ED Triage Notes (Signed)
Pt reported to ED with  c/o pain to right shoulder/arm after falling off table approximately 2 hrs ago. Endorses that pain radiates from shoulder and stops proximal to  elbow.No obvious swelling or deformity noted to shoulder or arm. Pt displays difficulty lifting extremity on command by ED staff notice pt able to lift arm at random times, including to sign MSE waiver. Pt able to flex and extend hand and rotate wrist without difficulty on command. No other complaints noted at this time.

## 2021-08-02 NOTE — ED Provider Notes (Addendum)
[facility] EMERGENCY DEPARTMENT Provider Note   CSN: [id number] Arrival date & time: 08/01/21  2156     History  Chief Complaint  Patient presents with   Arm Injury    Renee Velez is a 26 y.o. female.  The history is provided by the patient and medical records.  Arm Injury  26 y.o. F presenting to the ED for right arm pain.  States she was standing on a table trying to open a window when table leg broke causing her to fall into the floor on right arm.  There was no head injury or loss of consciousness.  She complains of pain along right upper arm and elbow.  She is right-hand dominant.  Denies any focal numbness or weakness.  She denies any other injuries.  No intervention tried prior to arrival.  Home Medications Prior to Admission medications   Medication Sig Start Date End Date Taking? Authorizing Provider  acetaminophen (TYLENOL) 325 MG tablet Take 2 tablets (650 mg total) by mouth every 6 (six) hours. 06/25/20   Sheila Oats, MD  amoxicillin-clavulanate (AUGMENTIN) 875-125 MG tablet Take 1 tablet by mouth 2 (two) times daily. 05/01/21   Al Decant, PA-C  coconut oil OIL Apply 1 application topically as needed (nipple pain). 06/25/20   Sheila Oats, MD  ferrous sulfate 325 (65 FE) MG tablet Take 1 tablet (325 mg total) by mouth every other day. 06/26/20   Sheila Oats, MD  ibuprofen (ADVIL) 600 MG tablet Take 1 tablet (600 mg total) by mouth every 6 (six) hours. 06/25/20   Sheila Oats, MD  levETIRAcetam (KEPPRA) 1000 MG tablet Take 1 tablet (1,000 mg total) by mouth 2 (two) times daily. 06/25/20 07/25/20  Sheila Oats, MD  Prenatal Vit-Fe Fumarate-FA (PRENATAL MULTIVITAMIN) TABS tablet Take 1 tablet by mouth daily at 12 noon.    [provider]      Allergies    Nickel    Review of Systems   Review of Systems  Musculoskeletal:  Positive for arthralgias.  All other systems reviewed and are negative.   Physical Exam Updated  Vital Signs BP (!) 142/69   Pulse 100   Resp 16   SpO2 94%   Physical Exam Vitals and nursing note reviewed.  Constitutional:      Appearance: She is well-developed.  HENT:     Head: Normocephalic and atraumatic.  Eyes:     Conjunctiva/sclera: Conjunctivae normal.     Pupils: Pupils are equal, round, and reactive to light.  Cardiovascular:     Rate and Rhythm: Normal rate and regular rhythm.     Heart sounds: Normal heart sounds.  Pulmonary:     Effort: Pulmonary effort is normal. No respiratory distress.     Breath sounds: Normal breath sounds. No rhonchi.  Abdominal:     General: Bowel sounds are normal.     Palpations: Abdomen is soft.     Tenderness: There is no abdominal tenderness. There is no rebound.  Musculoskeletal:        General: Normal range of motion.     Cervical back: Normal range of motion.     Comments: Right arm is normal in appearance without noted swelling or bony deformity, no open wounds or lacerations, able to flex and extend at elbow, wrist, and fingers, difficulty lifting arm above head, radial pulse intact, normal grips, normal distal sensation  Skin:    General: Skin is warm and dry.  Neurological:  Mental Status: She is alert and oriented to person, place, and time.     ED Results / Procedures / Treatments   Labs (all labs ordered are listed, but only abnormal results are displayed) Labs Reviewed - No data to display  EKG None  Radiology DG Elbow Complete Right  Result Date: 08/01/2021 CLINICAL DATA:  Injury, fall from the table. EXAM: RIGHT ELBOW - COMPLETE 3+ VIEW COMPARISON:  None Available. FINDINGS: There is no evidence of fracture, dislocation, or joint effusion. There is no evidence of arthropathy or other focal bone abnormality. Soft tissues are unremarkable. IMPRESSION: Negative. Electronically Signed   By: Larose Hires D.O.   On: 08/01/2021 22:44   DG Shoulder Right  Result Date: 08/01/2021 CLINICAL DATA:  Injury, fall from  the table. EXAM: RIGHT SHOULDER - 2+ VIEW COMPARISON:  None Available. FINDINGS: There is no evidence of fracture or dislocation. There is no evidence of arthropathy or other focal bone abnormality. Soft tissues are unremarkable. IMPRESSION: Negative. Electronically Signed   By: Larose Hires D.O.   On: 08/01/2021 22:44    Procedures .Ortho Injury Treatment  Date/Time: 08/02/2021 2:26 AM  Performed by: Garlon Hatchet, PA-C Authorized by: Garlon Hatchet, PA-C   Consent:    Consent obtained:  Verbal   Consent given by:  Patient   Risks discussed:  Nerve damage and stiffness   Alternatives discussed:  No treatmentInjury location: shoulder Location details: right shoulder Injury type: soft tissue Pre-procedure neurovascular assessment: neurovascularly intact  Anesthesia: Local anesthesia used: no Immobilization: sling Splint Applied by: Ortho Tech Supplies used: shoulder sling. Post-procedure neurovascular assessment: post-procedure neurovascularly intact       Medications Ordered in ED Medications - No data to display  ED Course/ Medical Decision Making/ A&P                           Medical Decision Making Amount and/or Complexity of Data Reviewed Radiology: ordered and independent interpretation performed.   26 year old female presenting to the ED for right arm pain.  She was standing on a table trying to open a window when table lead broke causing her to fall onto the floor on right arm.  No head injury or loss of consciousness.  Arm is without any acute deformity or open wound.  Able to flex and extend at elbow, wrist, and fingers without difficulty.  Radial pulse intact, normal grip strength.  X-rays obtained and reviewed, no acute findings.  Placed in shoulder sling for comfort.  Will start on anti-inflammatories and refer to orthopedics for follow-up if any ongoing issues.  Work note given.  Can return here for any new/acute changes.  Final Clinical Impression(s) / ED  Diagnoses Final diagnoses:  Injury of right upper extremity, initial encounter    Rx / DC Orders ED Discharge Orders     None         Garlon Hatchet, PA-C 08/02/21 0225    Garlon Hatchet, PA-C 08/02/21 0226    Palumbo, April, MD 08/02/21 [phone]

## 2021-08-02 NOTE — Discharge Instructions (Signed)
Can wear sling for comfort.  Can take tylenol or motrin for pain. If still having trouble after a few days, follow-up with orthopedics.  Call for appt.

## 2021-08-02 NOTE — Progress Notes (Signed)
Orthopedic Tech Progress Note Patient Details:  Renee Velez 05-23-1995 [id number]  Ortho Devices Type of Ortho Device: Shoulder immobilizer Ortho Device/Splint Location: rue Ortho Device/Splint Interventions: Ordered, Application, Adjustment   Post Interventions Patient Tolerated: Well  Al Decant 08/02/2021, 2:18 AM

## 2021-08-02 NOTE — ED Notes (Signed)
Ortho tech ib the way
